# Patient Record
Sex: Male | Born: 1960 | Race: Black or African American | Hispanic: No | Marital: Single | Smoking: Former smoker
Health system: Southern US, Community
[De-identification: ages and names within clinical notes are randomized; demographics above are authoritative.]

## PROBLEM LIST (undated history)

## (undated) DIAGNOSIS — A539 Syphilis, unspecified: Secondary | ICD-10-CM

## (undated) DIAGNOSIS — Z972 Presence of dental prosthetic device (complete) (partial): Secondary | ICD-10-CM

## (undated) DIAGNOSIS — I10 Essential (primary) hypertension: Secondary | ICD-10-CM

## (undated) DIAGNOSIS — J45909 Unspecified asthma, uncomplicated: Secondary | ICD-10-CM

## (undated) DIAGNOSIS — B1920 Unspecified viral hepatitis C without hepatic coma: Secondary | ICD-10-CM

## (undated) DIAGNOSIS — G629 Polyneuropathy, unspecified: Secondary | ICD-10-CM

## (undated) HISTORY — PX: CARPAL TUNNEL RELEASE: SHX101

---

## 2012-10-30 ENCOUNTER — Encounter (HOSPITAL_COMMUNITY): Payer: Self-pay | Admitting: Emergency Medicine

## 2012-10-30 DIAGNOSIS — Y9389 Activity, other specified: Secondary | ICD-10-CM | POA: Insufficient documentation

## 2012-10-30 DIAGNOSIS — K219 Gastro-esophageal reflux disease without esophagitis: Secondary | ICD-10-CM | POA: Insufficient documentation

## 2012-10-30 DIAGNOSIS — R071 Chest pain on breathing: Secondary | ICD-10-CM | POA: Insufficient documentation

## 2012-10-30 DIAGNOSIS — W010XXA Fall on same level from slipping, tripping and stumbling without subsequent striking against object, initial encounter: Secondary | ICD-10-CM | POA: Insufficient documentation

## 2012-10-30 DIAGNOSIS — S4980XA Other specified injuries of shoulder and upper arm, unspecified arm, initial encounter: Secondary | ICD-10-CM | POA: Insufficient documentation

## 2012-10-30 DIAGNOSIS — Y929 Unspecified place or not applicable: Secondary | ICD-10-CM | POA: Insufficient documentation

## 2012-10-30 DIAGNOSIS — S79919A Unspecified injury of unspecified hip, initial encounter: Secondary | ICD-10-CM | POA: Insufficient documentation

## 2012-10-30 DIAGNOSIS — Z79899 Other long term (current) drug therapy: Secondary | ICD-10-CM | POA: Insufficient documentation

## 2012-10-30 DIAGNOSIS — S46909A Unspecified injury of unspecified muscle, fascia and tendon at shoulder and upper arm level, unspecified arm, initial encounter: Secondary | ICD-10-CM | POA: Insufficient documentation

## 2012-10-30 NOTE — ED Notes (Addendum)
PT. LOST BALANCED AND FELL YESTERDAY , REPORTS PAIN AT BILATERAL HIP JOINT AND LEFT SHOULDER PAIN , NO LOC /AMBULATORY.

## 2012-10-31 ENCOUNTER — Emergency Department (HOSPITAL_COMMUNITY): Payer: Self-pay

## 2012-10-31 ENCOUNTER — Other Ambulatory Visit: Payer: Self-pay

## 2012-10-31 ENCOUNTER — Emergency Department (HOSPITAL_COMMUNITY)
Admit: 2012-10-31 | Discharge: 2012-10-31 | Disposition: A | Discharge status: Home / Self Care | Payer: Self-pay | Attending: Emergency Medicine | Admitting: Emergency Medicine

## 2012-10-31 ENCOUNTER — Emergency Department (HOSPITAL_COMMUNITY)
Admission: EM | Admit: 2012-10-31 | Discharge: 2012-10-31 | Disposition: A | Discharge status: Home / Self Care | Payer: Self-pay | Attending: Emergency Medicine | Admitting: Emergency Medicine

## 2012-10-31 DIAGNOSIS — Y92009 Unspecified place in unspecified non-institutional (private) residence as the place of occurrence of the external cause: Secondary | ICD-10-CM

## 2012-10-31 DIAGNOSIS — M25512 Pain in left shoulder: Secondary | ICD-10-CM

## 2012-10-31 DIAGNOSIS — K219 Gastro-esophageal reflux disease without esophagitis: Secondary | ICD-10-CM

## 2012-10-31 DIAGNOSIS — W19XXXA Unspecified fall, initial encounter: Secondary | ICD-10-CM

## 2012-10-31 DIAGNOSIS — R079 Chest pain, unspecified: Secondary | ICD-10-CM

## 2012-10-31 DIAGNOSIS — M25552 Pain in left hip: Secondary | ICD-10-CM

## 2012-10-31 DIAGNOSIS — M25551 Pain in right hip: Secondary | ICD-10-CM

## 2012-10-31 LAB — POCT I-STAT TROPONIN I

## 2012-10-31 LAB — POCT I-STAT, CHEM 8
Chloride: 106 mEq/L (ref 96–112)
Creatinine, Ser: 0.9 mg/dL (ref 0.50–1.35)
Glucose, Bld: 90 mg/dL (ref 70–99)
Hemoglobin: 14.3 g/dL (ref 13.0–17.0)
Potassium: 3.9 mEq/L (ref 3.5–5.1)
Sodium: 142 mEq/L (ref 135–145)

## 2012-10-31 LAB — CBC WITH DIFFERENTIAL/PLATELET
Eosinophils Absolute: 0.1 10*3/uL (ref 0.0–0.7)
Hemoglobin: 13.7 g/dL (ref 13.0–17.0)
Lymphocytes Relative: 50 % — ABNORMAL HIGH (ref 12–46)
Lymphs Abs: 2.3 10*3/uL (ref 0.7–4.0)
Monocytes Relative: 9 % (ref 3–12)
Neutro Abs: 1.8 10*3/uL (ref 1.7–7.7)
Neutrophils Relative %: 39 % — ABNORMAL LOW (ref 43–77)
Platelets: 161 10*3/uL (ref 150–400)
RBC: 4.25 MIL/uL (ref 4.22–5.81)
WBC: 4.7 10*3/uL (ref 4.0–10.5)

## 2012-10-31 MED ORDER — RABEPRAZOLE SODIUM 20 MG PO TBEC: 20.0000 mg | DELAYED_RELEASE_TABLET | Freq: Every day | ORAL | Status: DC

## 2012-10-31 MED ORDER — PANTOPRAZOLE SODIUM 40 MG PO TBEC
40.0000 mg | DELAYED_RELEASE_TABLET | Freq: Once | ORAL | Status: AC
  Administered 2012-10-31: 40 mg via ORAL
  Filled 2012-10-31: qty 1

## 2012-10-31 MED ORDER — HYDROCODONE-ACETAMINOPHEN 5-325 MG PO TABS
2.0000 | ORAL_TABLET | Freq: Once | ORAL | Status: AC
  Administered 2012-10-31: 2 via ORAL
  Filled 2012-10-31: qty 2

## 2012-10-31 MED ORDER — HYDROCODONE-ACETAMINOPHEN 5-325 MG PO TABS: 1.0000 | ORAL_TABLET | Freq: Four times a day (QID) | ORAL | Status: DC | PRN

## 2012-10-31 MED ORDER — ASPIRIN 81 MG PO CHEW
324.0000 mg | CHEWABLE_TABLET | Freq: Once | ORAL | Status: AC
  Administered 2012-10-31: 324 mg via ORAL
  Filled 2012-10-31: qty 4

## 2012-10-31 NOTE — ED Provider Notes (Addendum)
History     CSN: [id number]  Arrival date & time 10/30/12  2319   First MD Initiated Contact with Patient 10/31/12 [phone]      Chief Complaint  Patient presents with  . Fall    HPI Shaun Morgan is a 52 y.o. male presenting with hip pain and knee pain and shoulder pain after a fall. Patient says he was stretching his hips because they've been painful over the course of the last few days despite trying to stretch them. He says he was stretching them when he went to his knees a sharp pain and fell over and fell onto his left shoulder as well. Patient's hip pain is moderate to severe, bilateral, worse on movement, worse on ambulation, not resolved with over-the-counter medications, no fevers or chills. Patient is also complaining about intermittent chest pains on review of systems. He says his chest pain lasts from 15-30 minutes daily, radiates to the left shoulder, associated with some shortness of breath, it is a sharp stabbing pain resting makes it better, walking makes it worse. Patient had a stress test in [place] 7-8 years ago which was normal. Patient does have a history of smoking.  Father died in late 11's from MI, sister died at age 77 from MI.   History reviewed. No pertinent past medical history.  History reviewed. No pertinent past surgical history.  No family history on file.  History  Substance Use Topics  . Smoking status: Never Smoker   . Smokeless tobacco: Not on file  . Alcohol Use: No      Review of Systems At least 10pt or greater review of systems completed and are negative except where specified in the HPI.  Allergies  Review of patient's allergies indicates no known allergies.  Home Medications   Current Outpatient Rx  Name  Route  Sig  Dispense  Refill  . PRESCRIPTION MEDICATION   Oral   Take 1 tablet by mouth daily. Blood pressure medication         . RABEprazole (ACIPHEX) 20 MG tablet   Oral   Take 1 tablet (20 mg total) by mouth daily.   30  tablet   0     BP 124/71  Pulse 66  Temp(Src) 97.5 F (36.4 C) (Oral)  Resp 14  SpO2 94%  Physical Exam  Nursing notes reviewed.  Electronic medical record reviewed. VITAL SIGNS:   Filed Vitals:   10/31/12 0430 10/31/12 0445 10/31/12 0500 10/31/12 0515  BP: 115/72 117/80 117/67 123/69  Pulse: 56 55 50 49  Temp:      TempSrc:      Resp: 16 13    SpO2: 95% 96% 97% 97%   CONSTITUTIONAL: Awake, oriented, appears non-toxic HENT: Atraumatic, normocephalic, oral mucosa pink and moist, airway patent. Nares patent without drainage. External ears normal. EYES: Conjunctiva clear, EOMI, PERRLA NECK: Trachea midline, non-tender, supple CARDIOVASCULAR: Normal heart rate, Normal rhythm, No murmurs, rubs, gallops PULMONARY/CHEST: Clear to auscultation, no rhonchi, wheezes, or rales. Symmetrical breath sounds. Non-tender. ABDOMINAL: Non-distended, soft, non-tender - no rebound or guarding.  BS normal. NEUROLOGIC: Non-focal, moving all four extremities, no gross sensory or motor deficits. EXTREMITIES: No clubbing, cyanosis, or edema. Mild tenderness to passive and active range of motion of the hips. No tenderness in the shoulders. SKIN: Warm, Dry, No erythema, No rash  ED Course  Procedures (including critical care time)  Date: 11/02/2012  Rate: 62  Rhythm: normal sinus rhythm  QRS Axis: normal  Intervals: normal  ST/T Wave abnormalities: normal  Conduction Disutrbances: none  Narrative Interpretation: Widespread ST elevation with J-point elevation suggests repol abnormality   Labs Reviewed - No data to display Dg Hip Complete Left  10/31/2012   *RADIOLOGY REPORT*  Clinical Data: History of fall complaining of left hip pain.  LEFT HIP - COMPLETE 2+ VIEW  Comparison: No priors.  Findings: Two views of the left hip demonstrate no acute displaced fracture, subluxation, dislocation, joint or soft tissue abnormality.  Mild degenerative changes of osteoarthritis are noted.  IMPRESSION: 1.   No acute radiographic abnormality of the left hip. 2.  Mild left hip osteoarthritis.   Original Report Authenticated By: Trudie Reed, M.D.   Dg Hip Complete Right  10/31/2012   *RADIOLOGY REPORT*  Clinical Data: History of fall complaining of right hip pain.  RIGHT HIP - COMPLETE 2+ VIEW  Comparison: No priors.  Findings: AP view of the pelvis and eight PA and lateral views of the right hip demonstrate no acute displaced fracture of the bony pelvic ring.  Right hip is located.  Bilateral proximal femurs as visualized appear intact. Mild joint space narrowing, subchondral sclerosis and osteophyte formation in the hip joints bilaterally.  IMPRESSION: 1.  No acute radiographic abnormality of the bony pelvis or the right hip. 2.  Mild degenerative changes of osteoarthritis of the hip joints bilaterally.   Original Report Authenticated By: Trudie Reed, M.D.   Dg Chest Port 1 View  10/31/2012   *RADIOLOGY REPORT*  Clinical Data: Chest pain.  PORTABLE CHEST - 1 VIEW  Comparison: No priors.  Findings: Lung volumes are normal.  No consolidative airspace disease.  No pleural effusions.  No pneumothorax.  No pulmonary nodule or mass noted.  Pulmonary vasculature and the cardiomediastinal silhouette are within normal limits.  IMPRESSION: 1. No radiographic evidence of acute cardiopulmonary disease.   Original Report Authenticated By: Trudie Reed, M.D.   Dg Shoulder Left  10/31/2012   *RADIOLOGY REPORT*  Clinical Data: Fall, shoulder pain  LEFT SHOULDER - 2+ VIEW  Comparison: None.  Findings: Left glenohumeral joint degenerative change identified. No fracture or dislocation.  Left lung apex is clear.  AC joint distance is normal.  IMPRESSION: No acute abnormality.  Left glenohumeral joint degenerative change.   Original Report Authenticated By: Christiana Pellant, M.D.     1. Hip pain, bilateral   2. Left shoulder pain   3. Fall at home, initial encounter   4. GERD (gastroesophageal reflux disease)   5.  Chest pain       MDM  Do not think the patient has any significant injury from his minor fall, no radiologic evidence of fracture, my physical exam does not support any severe fracture. 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 More concerning is the patient's history on review of systems of his intermittent chest pain with pertinent family history of 2 first degree relatives dying in their late 29s and mid 42s with MI. Patient has had chest pain intermittently for some time now this is concerning for unstable angina. EKG obtained, unremarkable this time. Troponin obtained and is also within normal limits the I had a discussion with the patient about getting admitted to the hospital for stress testing and rule out ACS. Patient says he has a custody hearing today in [place] and cannot stay in the hospital. Patient has competence to make this medical decision to leave the hospital AGAINST MEDICAL ADVICE. Patient says he will followup with [facility] and  get a stress test.  Pt understands he could have a massive heart attack or die from possible acute coronary syndrome he is willing to take this risk, he understands accepts and medical risks and will still leave.        Jones Skene, MD 11/01/12 [id number]  Jones Skene, MD 11/02/12 [id number]

## 2013-07-14 ENCOUNTER — Encounter (HOSPITAL_COMMUNITY): Payer: Self-pay | Admitting: Emergency Medicine

## 2013-07-14 ENCOUNTER — Emergency Department (HOSPITAL_COMMUNITY)
Admission: EM | Admit: 2013-07-14 | Discharge: 2013-07-14 | Disposition: A | Discharge status: Home / Self Care | Payer: PRIVATE HEALTH INSURANCE | Attending: Emergency Medicine | Admitting: Emergency Medicine

## 2013-07-14 DIAGNOSIS — R51 Headache: Secondary | ICD-10-CM | POA: Insufficient documentation

## 2013-07-14 DIAGNOSIS — I10 Essential (primary) hypertension: Secondary | ICD-10-CM | POA: Insufficient documentation

## 2013-07-14 DIAGNOSIS — Z79899 Other long term (current) drug therapy: Secondary | ICD-10-CM | POA: Insufficient documentation

## 2013-07-14 DIAGNOSIS — G5600 Carpal tunnel syndrome, unspecified upper limb: Secondary | ICD-10-CM | POA: Insufficient documentation

## 2013-07-14 DIAGNOSIS — J45909 Unspecified asthma, uncomplicated: Secondary | ICD-10-CM | POA: Insufficient documentation

## 2013-07-14 HISTORY — DX: Unspecified asthma, uncomplicated: J45.909

## 2013-07-14 HISTORY — DX: Essential (primary) hypertension: I10

## 2013-07-14 LAB — POCT I-STAT, CHEM 8
BUN: 14 mg/dL (ref 6–23)
CALCIUM ION: 1.21 mmol/L (ref 1.12–1.23)
CHLORIDE: 105 meq/L (ref 96–112)
Creatinine, Ser: 1.2 mg/dL (ref 0.50–1.35)
GLUCOSE: 103 mg/dL — AB (ref 70–99)
HEMATOCRIT: 45 % (ref 39.0–52.0)
Hemoglobin: 15.3 g/dL (ref 13.0–17.0)
Potassium: 4.2 mEq/L (ref 3.7–5.3)
Sodium: 145 mEq/L (ref 137–147)
TCO2: 28 mmol/L (ref 0–100)

## 2013-07-14 LAB — GLUCOSE, CAPILLARY: Glucose-Capillary: 91 mg/dL (ref 70–99)

## 2013-07-14 MED ORDER — HYDROCHLOROTHIAZIDE 25 MG PO TABS
12.5000 mg | ORAL_TABLET | Freq: Every day | ORAL | Status: DC
  Administered 2013-07-14: 12.5 mg via ORAL
  Filled 2013-07-14 (×2): qty 1

## 2013-07-14 MED ORDER — HYDROCODONE-ACETAMINOPHEN 5-325 MG PO TABS
1.0000 | ORAL_TABLET | Freq: Once | ORAL | Status: AC
  Administered 2013-07-14: 1 via ORAL
  Filled 2013-07-14: qty 1

## 2013-07-14 MED ORDER — AMLODIPINE BESYLATE 5 MG PO TABS
5.0000 mg | ORAL_TABLET | Freq: Every day | ORAL | Status: DC
  Administered 2013-07-14: 5 mg via ORAL
  Filled 2013-07-14: qty 1

## 2013-07-14 MED ORDER — HYDROCODONE-ACETAMINOPHEN 5-325 MG PO TABS: 1.0000 | ORAL_TABLET | Freq: Four times a day (QID) | ORAL | Status: DC | PRN

## 2013-07-14 NOTE — ED Provider Notes (Addendum)
CSN: [id number]     Arrival date & time 07/14/13  1602 History   First MD Initiated Contact with Patient 07/14/13 1624     Chief Complaint  Patient presents with  . Hypertension     (Consider location/radiation/quality/duration/timing/severity/associated sxs/prior Treatment) HPI Comments: Takes 2 blood pressure pills but has been away from home and has not taken them for the last 1 week.  Also states he had been eating salty food and has been very stressed due to recent school and testing.  Denies CP, SOB or abd pain.  Normal appetite.  Intermittent HA but not currently having a HA.  Patient is a 53 y.o. male presenting with hypertension. The history is provided by the patient.  Hypertension This is a new problem. Episode onset: 1 week. The problem occurs constantly. The problem has been gradually worsening. Associated symptoms include headaches. Pertinent negatives include no chest pain, no abdominal pain and no shortness of breath. Associated symptoms comments: occassional lightheadedness. Nothing aggravates the symptoms. The symptoms are relieved by rest and sleep. The treatment provided no relief.    Past Medical History  Diagnosis Date  . Hypertension   . Asthma    Past Surgical History  Procedure Laterality Date  . Carpal tunnel release     No family history on file. History  Substance Use Topics  . Smoking status: Never Smoker   . Smokeless tobacco: Not on file  . Alcohol Use: No    Review of Systems  Constitutional: Negative for fever.  Respiratory: Negative for shortness of breath.   Cardiovascular: Negative for chest pain.  Gastrointestinal: Negative for abdominal pain.  Musculoskeletal:       Pain in the right wrist and first 3 fingers of the hand where his carpal tunnel used to bother him.  Neurological: Positive for headaches.  All other systems reviewed and are negative.      Allergies  Review of patient's allergies indicates no known allergies.  Home  Medications   Current Outpatient Rx  Name  Route  Sig  Dispense  Refill  . amLODipine (NORVASC) 5 MG tablet   Oral   Take 5 mg by mouth daily.         . hydrochlorothiazide (HYDRODIURIL) 25 MG tablet   Oral   Take 12.5 mg by mouth daily.          BP 163/102  Pulse 79  Temp(Src) 97.3 F (36.3 C) (Oral)  Resp 18  Ht 5\' 10"  (1.778 m)  Wt 227 lb 8 oz (103.193 kg)  BMI 32.64 kg/m2  SpO2 98% Physical Exam  Nursing note and vitals reviewed. Constitutional: He is oriented to person, place, and time. He appears well-developed and well-nourished. No distress.  HENT:  Head: Normocephalic and atraumatic.  Right Ear: Tympanic membrane and ear canal normal.  Mouth/Throat: Oropharynx is clear and moist.  Left ear cerumen impaction  Eyes: Conjunctivae and EOM are normal. Pupils are equal, round, and reactive to light.  Neck: Normal range of motion. Neck supple.  Cardiovascular: Normal rate, regular rhythm and intact distal pulses.   No murmur heard. Pulmonary/Chest: Effort normal and breath sounds normal. No respiratory distress. He has no wheezes. He has no rales.  Abdominal: Soft. He exhibits no distension. There is no tenderness. There is no rebound and no guarding.  Musculoskeletal: Normal range of motion. He exhibits no edema and no tenderness.  Neurological: He is alert and oriented to person, place, and time. He has normal strength. No cranial  nerve deficit or sensory deficit.  No arm drift.  Normal speech.  Normal gait without ataxia  Skin: Skin is warm and dry. No rash noted. No erythema.  Psychiatric: He has a normal mood and affect. His behavior is normal.    ED Course  Procedures (including critical care time) Labs Review Labs Reviewed  POCT I-STAT, CHEM 8 - Abnormal; Notable for the following:    Glucose, Bld 103 (*)    All other components within normal limits   Imaging Review No results found.  EKG Interpretation    Date/Time:  Saturday July 14 2013  17:04:28 EST Ventricular Rate:  71 PR Interval:  152 QRS Duration: 71 QT Interval:  386 QTC Calculation: 419 R Axis:   55 Text Interpretation:  Sinus rhythm Left atrial enlargement Probable left ventricular hypertrophy Lateral infarct, acute (LAD) Baseline wander in lead(s) V2 V5 V6 Early repolarization No significant change since last tracing Confirmed by Maryan Rued  MD, Adilen Pavelko ([id number]) on 07/14/2013 5:25:14 PM            MDM   Final diagnoses:  Carpal tunnel syndrome  Hypertension   Patient is here with vague symptoms of intermittent lightheadedness with occasional headache for the last one week. He states that he is been very stressed because he's been taking final exams for school and then came down to [facility] and left his blood pressure medication at home for the last one week. He takes one tablet a day but was not sure what it was. She otherwise denies chest pain or shortness of breath. No focal weakness but states he had carpal tunnel release of the right braced and he is been having significant pain in the first 3 fingers of his hand. On exam he has no neurologic deficits, he's currently not complaining of a headache, sensation and movement strength is intact bilaterally with a normal gait, normal speech and no present arm drift.  Repeat blood pressure is 155/102. Will give patient a dose of his Norvasc and hydrochlorothiazide which was confirmed by pharmacy.  Low suspicion for intercranial pathology. Patient denies any chest pain or shortness of breath. Creatinine is 1.2 with no signs of renal failure. Patient also states he's been eating a lot of salty foods which is also not helping his blood pressure. No signs or symptoms concerning for stroke at this time.  8:19 PM Labs reassuring.  bP meds given and pt going home today.  Also given pain meds for carpal tunnel.  Blanchie Dessert, MD 07/14/13 2020  Blanchie Dessert, MD 07/14/13 2022

## 2013-07-14 NOTE — ED Notes (Signed)
Pt reports here due to ran out of BP meds x 1 week. Pt reports headache and dizziness onset around 1230. No facial droop, weak but equal grips B/L, no arm drift, speech clear.

## 2013-07-14 NOTE — Discharge Instructions (Signed)
Carpal Tunnel Syndrome The carpal tunnel is an area under the skin of the palm of your hand. Nerves, blood vessels, and strong tissues (tendons) pass through the tunnel. The tunnel can become puffy (swollen). If this happens, a nerve can be pinched in the wrist. This causes carpal tunnel syndrome.  HOME CARE  Take all medicine as told by your doctor.  If you were given a splint, wear it as told. Wear it at night or at times when your doctor told you to.  Rest your wrist from the activity that causes your pain.  Put ice on your wrist after long periods of wrist activity.  Put ice in a plastic bag.  Place a towel between your skin and the bag.  Leave the ice on for 15-20 minutes, 03-04 times a day.  Keep all doctor visits as told. GET HELP RIGHT AWAY IF:  You have new problems you cannot explain.  Your problems get worse and medicine does not help. MAKE SURE YOU:   Understand these instructions.  Will watch your condition.  Will get help right away if you are not doing well or get worse. Document Released: 05/06/2011 Document Revised: 08/09/2011 Document Reviewed: 05/06/2011 [facility] Patient Information 2014 [place], [place].  Arterial Hypertension Arterial hypertension (high blood pressure) is a condition of elevated pressure in your blood vessels. Hypertension over a long period of time is a risk factor for strokes, heart attacks, and heart failure. It is also the leading cause of kidney (renal) failure.  CAUSES   In Adults -- Over 90% of all hypertension has no known cause. This is called essential or primary hypertension. In the other 10% of people with hypertension, the increase in blood pressure is caused by another disorder. This is called secondary hypertension. Important causes of secondary hypertension are:  Heavy alcohol use.  Obstructive sleep apnea.  Hyperaldosterosim (Conn's syndrome).  Steroid use.  Chronic kidney  failure.  Hyperparathyroidism.  Medications.  Renal artery stenosis.  Pheochromocytoma.  Cushing's disease.  Coarctation of the aorta.  Scleroderma renal crisis.  Licorice (in excessive amounts).  Drugs (cocaine, methamphetamine). Your caregiver can explain any items above that apply to you.  In Children -- Secondary hypertension is more common and should always be considered.  Pregnancy -- Few women of childbearing age have high blood pressure. However, up to 10% of them develop hypertension of pregnancy. Generally, this will not harm the woman. It may be a sign of 3 complications of pregnancy: preeclampsia, HELLP syndrome, and eclampsia. Follow up and control with medication is necessary. SYMPTOMS   This condition normally does not produce any noticeable symptoms. It is usually found during a routine exam.  Malignant hypertension is a late problem of high blood pressure. It may have the following symptoms:  Headaches.  Blurred vision.  End-organ damage (this means your kidneys, heart, lungs, and other organs are being damaged).  Stressful situations can increase the blood pressure. If a person with normal blood pressure has their blood pressure go up while being seen by their caregiver, this is often termed "white coat hypertension." Its importance is not known. It may be related with eventually developing hypertension or complications of hypertension.  Hypertension is often confused with mental tension, stress, and anxiety. DIAGNOSIS  The diagnosis is made by 3 separate blood pressure measurements. They are taken at least 1 week apart from each other. If there is organ damage from hypertension, the diagnosis may be made without repeat measurements. Hypertension is usually identified by  having blood pressure readings:  Above 140/90 mmHg measured in both arms, at 3 separate times, over a couple weeks.  Over 130/80 mmHg should be considered a risk factor and may require  treatment in patients with diabetes. Blood pressure readings over 120/80 mmHg are called "pre-hypertension" even in non-diabetic patients. To get a true blood pressure measurement, use the following guidelines. Be aware of the factors that can alter blood pressure readings.  Take measurements at least 1 hour after caffeine.  Take measurements 30 minutes after smoking and without any stress. This is another reason to quit smoking  it raises your blood pressure.  Use a proper cuff size. Ask your caregiver if you are not sure about your cuff size.  Most home blood pressure cuffs are automatic. They will measure systolic and diastolic pressures. The systolic pressure is the pressure reading at the start of sounds. Diastolic pressure is the pressure at which the sounds disappear. If you are elderly, measure pressures in multiple postures. Try sitting, lying or standing.  Sit at rest for a minimum of 5 minutes before taking measurements.  You should not be on any medications like decongestants. These are found in many cold medications.  Record your blood pressure readings and review them with your caregiver. If you have hypertension:  Your caregiver may do tests to be sure you do not have secondary hypertension (see "causes" above).  Your caregiver may also look for signs of metabolic syndrome. This is also called Syndrome X or Insulin Resistance Syndrome. You may have this syndrome if you have type 2 diabetes, abdominal obesity, and abnormal blood lipids in addition to hypertension.  Your caregiver will take your medical and family history and perform a physical exam.  Diagnostic tests may include blood tests (for glucose, cholesterol, potassium, and kidney function), a urinalysis, or an EKG. Other tests may also be necessary depending on your condition. PREVENTION  There are important lifestyle issues that you can adopt to reduce your chance of developing hypertension:  Maintain a normal  weight.  Limit the amount of salt (sodium) in your diet.  Exercise often.  Limit alcohol intake.  Get enough potassium in your diet. Discuss specific advice with your caregiver.  Follow a DASH diet (dietary approaches to stop hypertension). This diet is rich in fruits, vegetables, and low-fat dairy products, and avoids certain fats. PROGNOSIS  Essential hypertension cannot be cured. Lifestyle changes and medical treatment can lower blood pressure and reduce complications. The prognosis of secondary hypertension depends on the underlying cause. Many people whose hypertension is controlled with medicine or lifestyle changes can live a normal, healthy life.  RISKS AND COMPLICATIONS  While high blood pressure alone is not an illness, it often requires treatment due to its short- and long-term effects on many organs. Hypertension increases your risk for:  CVAs or strokes (cerebrovascular accident).  Heart failure due to chronically high blood pressure (hypertensive cardiomyopathy).  Heart attack (myocardial infarction).  Damage to the retina (hypertensive retinopathy).  Kidney failure (hypertensive nephropathy). Your caregiver can explain list items above that apply to you. Treatment of hypertension can significantly reduce the risk of complications. TREATMENT   For overweight patients, weight loss and regular exercise are recommended. Physical fitness lowers blood pressure.  Mild hypertension is usually treated with diet and exercise. A diet rich in fruits and vegetables, fat-free dairy products, and foods low in fat and salt (sodium) can help lower blood pressure. Decreasing salt intake decreases blood pressure in a 1/3 of  people.  Stop smoking if you are a smoker. The steps above are highly effective in reducing blood pressure. While these actions are easy to suggest, they are difficult to achieve. Most patients with moderate or severe hypertension end up requiring medications to bring  their blood pressure down to a normal level. There are several classes of medications for treatment. Blood pressure pills (antihypertensives) will lower blood pressure by their different actions. Lowering the blood pressure by 10 mmHg may decrease the risk of complications by as much as 25%. The goal of treatment is effective blood pressure control. This will reduce your risk for complications. Your caregiver will help you determine the best treatment for you according to your lifestyle. What is excellent treatment for one person, may not be for you. HOME CARE INSTRUCTIONS   Do not smoke.  Follow the lifestyle changes outlined in the "Prevention" section.  If you are on medications, follow the directions carefully. Blood pressure medications must be taken as prescribed. Skipping doses reduces their benefit. It also puts you at risk for problems.  Follow up with your caregiver, as directed.  If you are asked to monitor your blood pressure at home, follow the guidelines in the "Diagnosis" section above. SEEK MEDICAL CARE IF:   You think you are having medication side effects.  You have recurrent headaches or lightheadedness.  You have swelling in your ankles.  You have trouble with your vision. SEEK IMMEDIATE MEDICAL CARE IF:   You have sudden onset of chest pain or pressure, difficulty breathing, or other symptoms of a heart attack.  You have a severe headache.  You have symptoms of a stroke (such as sudden weakness, difficulty speaking, difficulty walking). MAKE SURE YOU:   Understand these instructions.  Will watch your condition.  Will get help right away if you are not doing well or get worse. Document Released: 05/17/2005 Document Revised: 08/09/2011 Document Reviewed: 12/15/2006 [facility] Patient Information 2014 [place].

## 2013-10-15 ENCOUNTER — Emergency Department (HOSPITAL_COMMUNITY)
Admission: EM | Admit: 2013-10-15 | Discharge: 2013-10-16 | Disposition: A | Discharge status: Home / Self Care | Payer: PRIVATE HEALTH INSURANCE | Attending: Emergency Medicine | Admitting: Emergency Medicine

## 2013-10-15 ENCOUNTER — Encounter (HOSPITAL_COMMUNITY): Payer: Self-pay | Admitting: Emergency Medicine

## 2013-10-15 DIAGNOSIS — I10 Essential (primary) hypertension: Secondary | ICD-10-CM | POA: Insufficient documentation

## 2013-10-15 DIAGNOSIS — IMO0001 Reserved for inherently not codable concepts without codable children: Secondary | ICD-10-CM | POA: Insufficient documentation

## 2013-10-15 DIAGNOSIS — M62838 Other muscle spasm: Secondary | ICD-10-CM | POA: Insufficient documentation

## 2013-10-15 DIAGNOSIS — J45909 Unspecified asthma, uncomplicated: Secondary | ICD-10-CM | POA: Insufficient documentation

## 2013-10-15 DIAGNOSIS — Z87891 Personal history of nicotine dependence: Secondary | ICD-10-CM | POA: Insufficient documentation

## 2013-10-15 DIAGNOSIS — M549 Dorsalgia, unspecified: Secondary | ICD-10-CM | POA: Insufficient documentation

## 2013-10-15 DIAGNOSIS — M791 Myalgia, unspecified site: Secondary | ICD-10-CM

## 2013-10-15 DIAGNOSIS — Z79899 Other long term (current) drug therapy: Secondary | ICD-10-CM | POA: Insufficient documentation

## 2013-10-15 NOTE — ED Notes (Signed)
Pt reports that he stopped smoking and drinking alcohol on Mother's day. States that he started back on his BP medication. States that since then he has been having cramping on his left side including his arm and leg.

## 2013-10-15 NOTE — ED Notes (Signed)
Pt states that he was drinking 72 oz every other day prior to mothers day and then stopped on mothers day. Pt states that initially he started experiencing cramps and lightheadedness since mothers day. States that he was not taking his BP meds prior to Mothers day and started them again on that day.

## 2013-10-15 NOTE — ED Provider Notes (Signed)
CSN: [id number]     Arrival date & time 10/15/13  1847 History   First MD Initiated Contact with Patient 10/15/13 2331     Chief Complaint  Patient presents with  . Tingling     (Consider location/radiation/quality/duration/timing/severity/associated sxs/prior Treatment) HPI Comments: Patient presents with cramping of his left arm, left leg, and left back.  He reports that he has been having this cramping intermittently since restarting his blood pressure medication two weeks ago.  He is currently on Amlodipine and HCTZ.  He states that he has had similar cramping in the past when on these medications.  He denies numbness, tingling, weakness, dysphagia, vision changes, headache, chest pain, or SOB.  He reports that he does have a history of Alcohol Abuse.  Last drink was 8 days ago.  He also has a history of smoking.  Last cigarette was also 8 days ago.  The history is provided by the patient.    Past Medical History  Diagnosis Date  . Hypertension   . Asthma    Past Surgical History  Procedure Laterality Date  . Carpal tunnel release     History reviewed. No pertinent family history. History  Substance Use Topics  . Smoking status: Former Research scientist (life sciences)  . Smokeless tobacco: Not on file  . Alcohol Use: No    Review of Systems  Musculoskeletal: Positive for myalgias.  All other systems reviewed and are negative.     Allergies  Review of patient's allergies indicates no known allergies.  Home Medications   Prior to Admission medications   Medication Sig Start Date End Date Taking? Authorizing Provider  albuterol (PROVENTIL HFA;VENTOLIN HFA) 108 (90 BASE) MCG/ACT inhaler Inhale 2 puffs into the lungs every 6 (six) hours as needed for wheezing or shortness of breath.   Yes Historical Provider, MD  amLODipine (NORVASC) 5 MG tablet Take 5 mg by mouth daily.   Yes Historical Provider, MD  hydrochlorothiazide (HYDRODIURIL) 25 MG tablet Take 12.5 mg by mouth daily.   Yes Historical  Provider, MD   BP 132/95  Pulse 68  Temp(Src) 97.8 F (36.6 C) (Oral)  Resp 16  SpO2 100% Physical Exam  Nursing note and vitals reviewed. Constitutional: He appears well-developed and well-nourished.  HENT:  Head: Normocephalic and atraumatic.  Mouth/Throat: Oropharynx is clear and moist.  Eyes: EOM are normal. Pupils are equal, round, and reactive to light.  Cardiovascular: Normal rate, regular rhythm and normal heart sounds.   Pulmonary/Chest: Effort normal and breath sounds normal.  Musculoskeletal: Normal range of motion.  Neurological: He is alert. He has normal strength. No cranial nerve deficit or sensory deficit. Coordination and gait normal.  Sensation of both hands and both feet intact  Skin: Skin is warm and dry.  Psychiatric: He has a normal mood and affect.    ED Course  Procedures (including critical care time) Labs Review Labs Reviewed - No data to display  Imaging Review No results found.   EKG Interpretation None      MDM   Final diagnoses:  None  Patient complaining of muscle spasms and muscle cramps in his back, arm, and leg.  He reports that symptoms began when he started back on his blood pressure medication.  He has had similar symptoms in the past when on blood pressure medication.  Normal neurological exam.  Labs unremarkable.  Suspect that symptoms may be a side effect of medication.  Patient stable for discharge.  Return precautions given.   Hyman Bible, PA-C 10/17/13  2348 

## 2013-10-16 LAB — CBC WITH DIFFERENTIAL/PLATELET
Basophils Absolute: 0 10*3/uL (ref 0.0–0.1)
Basophils Relative: 0 % (ref 0–1)
EOS ABS: 0.1 10*3/uL (ref 0.0–0.7)
Eosinophils Relative: 2 % (ref 0–5)
HCT: 43.4 % (ref 39.0–52.0)
HEMOGLOBIN: 14.5 g/dL (ref 13.0–17.0)
LYMPHS PCT: 45 % (ref 12–46)
Lymphs Abs: 2.8 10*3/uL (ref 0.7–4.0)
MCH: 33 pg (ref 26.0–34.0)
MCHC: 33.4 g/dL (ref 30.0–36.0)
MCV: 98.9 fL (ref 78.0–100.0)
Monocytes Absolute: 0.5 10*3/uL (ref 0.1–1.0)
Monocytes Relative: 7 % (ref 3–12)
NEUTROS PCT: 46 % (ref 43–77)
Neutro Abs: 2.9 10*3/uL (ref 1.7–7.7)
Platelets: 185 10*3/uL (ref 150–400)
RBC: 4.39 MIL/uL (ref 4.22–5.81)
RDW: 12.8 % (ref 11.5–15.5)
WBC: 6.4 10*3/uL (ref 4.0–10.5)

## 2013-10-16 LAB — BASIC METABOLIC PANEL
BUN: 14 mg/dL (ref 6–23)
CO2: 25 meq/L (ref 19–32)
Calcium: 9 mg/dL (ref 8.4–10.5)
Chloride: 102 mEq/L (ref 96–112)
Creatinine, Ser: 1.13 mg/dL (ref 0.50–1.35)
GFR calc Af Amer: 85 mL/min — ABNORMAL LOW (ref >90)
GFR, EST NON AFRICAN AMERICAN: 73 mL/min — AB (ref >90)
Glucose, Bld: 106 mg/dL — ABNORMAL HIGH (ref 70–99)
POTASSIUM: 3.7 meq/L (ref 3.7–5.3)
SODIUM: 140 meq/L (ref 137–147)

## 2013-10-16 NOTE — Discharge Instructions (Signed)
Your muscle spasms may be a side effect of your blood pressure medication.  It is important for you to take your blood pressure medication as prescribed.  Follow up with a Primary Care Physician.  Return to the Emergency Department if you develop weakness of your arms or legs, chest pain, difficulty speaking, difficulty swallowing, vision changes or anything else that concerns you.    Emergency Department Resource Guide 1) Find a Doctor and Pay Out of Pocket Although you won't have to find out who is covered by your insurance plan, it is a good idea to ask around and get recommendations. You will then need to call the office and see if the doctor you have chosen will accept you as a new patient and what types of options they offer for patients who are self-pay. Some doctors offer discounts or will set up payment plans for their patients who do not have insurance, but you will need to ask so you aren't surprised when you get to your appointment.  2) Contact Your Local Health Department Not all health departments have doctors that can see patients for sick visits, but many do, so it is worth a call to see if yours does. If you don't know where your local health department is, you can check in your phone book. The CDC also has a tool to help you locate your state's health department, and many state websites also have listings of all of their local health departments.  3) Find a [facility] If your illness is not likely to be very severe or complicated, you may want to try a walk in clinic. These are popping up all over the country in pharmacies, drugstores, and shopping centers. They're usually staffed by nurse practitioners or physician assistants that have been trained to treat common illnesses and complaints. They're usually fairly quick and inexpensive. However, if you have serious medical issues or chronic medical problems, these are probably not your best option.  No Primary Care Doctor: - Call  Health Connect at  [phone] - they can help you locate a primary care doctor that  accepts your insurance, provides certain services, etc. - Physician Referral Service- [phone]  Chronic Pain Problems: Organization         Address  Phone   Notes  [facility]  [phone] Patients need to be referred by their primary care doctor.   Medication Assistance: Organization         Address  Phone   Notes  [facility] Medication [facility] [address] [place], [place] [address] [phone] --Must be a resident of [facility] -- Must have NO insurance coverage whatsoever (no Medicaid/ Medicare, etc.) -- The pt. MUST have a primary care doctor that directs their care regularly and follows them in the community   [facility]ist  [phone]   [employer]  [phone]    Agencies that provide inexpensive medical care: Organization         Address  Phone   Notes  [place]  [phone]   [facility]    [phone]   [facility] [place], [place] [address] [phone]   [place] [address] [place] [phone]   Planned Parenthood    [phone]   [facility]    [phone]   [place] and [place]  Mardene Speak Phone:  [phone], Fax:  [phone] Hours of Operation:  9 am - 6 pm, M-F.  Also accepts Medicaid/Medicare and self-pay.  [facility] for [place], [address], [place] Phone: [phone], Fax: [phone]. Hours of Operation:  8:30 am - 5:30 pm, M-F.  Also accepts Medicaid and self-pay.  [facility] [place] [address], [place] Phone: [phone]   [place], [place], [place] [phone], Ext. [phone] Mondays & Thursdays: 7-9 AM.  First 15 patients are seen on a first come, first serve  basis.    [place] Providers:  Organization         Address  Phone   Notes  [facility] [address], [place] [phone] Also accepts self-pay patients.  [facility] [place] [address], [place]  [phone]   [place], [address], [place] [phone]   [facility] [address] [place] [phone]   Lucianne Lei [address], [place]   [phone] Only accepts [place] Access [place] patients after they have their name applied to their card.   Self-Pay (no insurance) in [facility]:  Organization         Address  Phone   Notes  Sickle Cell Patients, [facility] [place] [phone]   [facility] [place] [phone]   [facility] [place]  [place], [place], [place] [phone]   [facility]/Dr. Osei-Bonsu  [address] [place] or [address], [place] [phone] Phone number for both [place] and [place] locations is the same.  [facility] [address] [place] [phone]   [facility] [address], [place] or [address] [phone]   [facility] [address], [place] [phone], phone; [phone], fax Sees patients 1st and 3rd Saturday of every month.  Must not qualify for public or private insurance (i.e. Medicaid, Medicare, [place] Health Choice, Veterans' Benefits)  Household income should be no more than 200% of the poverty level The clinic cannot treat you if you are pregnant or think you are pregnant  Sexually transmitted diseases are not treated at the clinic.    Dental Care: Organization         Address  Phone  Notes  [facility] Department of [facility] [place] [phone] Accepts children up to age 83 who are enrolled in [place] or [place]; pregnant women with a Medicaid card; and children who have applied for Medicaid or [place] Health Choice, but were declined, whose parents can pay a reduced fee at time of service.  [facility] of [facility]  [address], [place] [phone] Accepts children up to age 81 who are enrolled in [place] or [place]; pregnant women with a Medicaid card; and children who have applied for Medicaid or [place] Health Choice, but were declined, whose parents can pay a reduced fee at time of service.  [place] Adult [facility] Access PRO[facility]  [place] [phone] Patients are seen  by appointment only. Walk-ins are not accepted. [place] will see patients 43 years of age and older. Monday - Tuesday (8am-5pm) Most Wednesdays (8:30-5pm) $30 per visit, cash only  [facility] Adult [facility] Access PRO[facility]  [address], [facility] [phone] Patients are seen by appointment only. Walk-ins are not accepted. [place] will see patients 22 years of age and older. One Wednesday Evening (Monthly: Volunteer Based).  $30 per visit, cash only  [place]  [phone] for adults; Children under age 43, call Graduate Pediatric Dentistry at [phone]. Children aged 30-14, please call [phone] to request a pediatric application.  Dental services are provided in all areas of dental care including fillings, crowns and bridges, complete and partial dentures, implants, gum treatment, root canals, and extractions. Preventive care is also provided. Treatment is provided to both adults and children. Patients are selected via a lottery and there is often a waiting list.   [facility] [address], [place]  [phone]  [email or web address]   [facility] [address] [place], [place] [phone], Ext. [phone] Second and Fourth Thursday of each month, opens at 6:30 AM; Clinic ends at 9 AM.  Patients are seen on a first-come first-served basis, and a limited number are seen during each clinic.   [facility]  [address] Hillard Danker [place], [place] [phone]   Eligibility Requirements You must have lived in [place], [place], or [place] counties for at least the last three months.   You cannot be eligible for state or federal sponsored [employer], including [employer], [place], or [employer].   You generally cannot be eligible for healthcare insurance through your employer.    How to apply: Eligibility screenings are held every Tuesday and Wednesday afternoon from 1:00 pm until 4:00 pm. You do not need an appointment for the interview!  [facility] [address], [place], [place]   [place]  [place] Department  [place]  [phone]    Behavioral Health Resources in the Community: Intensive Outpatient Programs Organization         Address  Phone  Notes  [place] [place] [address], [place], [place] [phone]   [facility] Outpatient [address] [place], [place]   [facility] [address] [place], [place]   [place] [address]  [place], [place] or [phone]   Substance Abuse Resources Organization         Address  Phone  Notes  [facility] Drug [facility]  [phone]   [place]  [phone]   [facility] [place]   Chinita Pester  [phone]   Residential & Outpatient Substance Abuse Program  [phone]   Psychological Services Organization          Address  Phone  Notes  [facility]  [place]  [phone]   [place] [address] [place] or [phone]    Mobile Crisis Teams Organization         Address  Phone  Notes  [facility], Mobile Crisis Care Unit  [phone]   [facility]  [address] [place], [place]   [facility] [address] [place]  [phone]   ° °Self-Help/Support Groups °Organization         Address  Phone             Notes  °[facility] [place] - variety of support groups  336- [phone] Call for more information  °Narcotics Anonymous (NA), [facility] [address], °[place]  2 meetings at this location  ° °Residential Treatment Programs °Organization         Address  Phone  Notes  °[facility] [address],    °[place]  [phone]   °[facility] ° [address], [place], [place] [phone]   °[facility] [address], [place] [phone] Admissions: 8am-3pm M-F  °[facility] [address]    °[place], [place] [phone]   °[facility] [address], [place], [place] [phone]   °[facility] [address]  °[place], [place] [phone]   °[facility] - Intensive Outpatient [address], [place], [place] [phone]   °[facility] [address]  °[place], [place] 1-877-615-2722 or [phone]   °[facility] ([facility]) [address] [place], [place] [phone] Accepts Medicaid  °[facility] [address]  °[place] 1-800-659-3381 Substance Abuse/Addiction Treatment  ° °[place] Behavioral Health Resources °Organization         Address  Phone  Notes  °[facility]  (888) [phone]   °Julie Brannon, PhD [address] [place], [place]   ([phone]) [phone] or (336) [phone]   °[facility]   [address] °[place], [place] (336) [phone]   °[facility] [address], [place], [place] ([phone]) [phone] Insurance/Medicaid/sponsorship through [facility]  °[facility] [address]                                    [place], [place] [phone]) [phone] Therapy/tele-psych/case  °[facility] [address]  ° [place],  ([phone]) [phone]    °Dr. Arfeen  (336) [phone]   °[facility] [place]  [employer] [place] [facility] 1) [address], [place] °2) [address], [place] °3)  [address], [place] (336) [phone] °(336) [phone] ° °(336) [phone]   °[place] Child Abuse Hotline (336) 342-1394 or (336) 342-3537 (After Hours)    ° ° ° °

## 2013-10-18 NOTE — ED Provider Notes (Signed)
Medical screening examination/treatment/procedure(s) were performed by non-physician practitioner and as supervising physician I was immediately available for consultation/collaboration.   Delora Fuel, MD 43/15/40 0867

## 2014-04-23 DIAGNOSIS — Z87891 Personal history of nicotine dependence: Secondary | ICD-10-CM | POA: Diagnosis not present

## 2014-04-23 DIAGNOSIS — J45909 Unspecified asthma, uncomplicated: Secondary | ICD-10-CM | POA: Insufficient documentation

## 2014-04-23 DIAGNOSIS — Z7952 Long term (current) use of systemic steroids: Secondary | ICD-10-CM | POA: Insufficient documentation

## 2014-04-23 DIAGNOSIS — R51 Headache: Secondary | ICD-10-CM | POA: Diagnosis not present

## 2014-04-23 DIAGNOSIS — J329 Chronic sinusitis, unspecified: Secondary | ICD-10-CM | POA: Insufficient documentation

## 2014-04-23 DIAGNOSIS — Z79899 Other long term (current) drug therapy: Secondary | ICD-10-CM | POA: Insufficient documentation

## 2014-04-23 DIAGNOSIS — R0981 Nasal congestion: Secondary | ICD-10-CM | POA: Diagnosis present

## 2014-04-23 DIAGNOSIS — I10 Essential (primary) hypertension: Secondary | ICD-10-CM | POA: Diagnosis not present

## 2014-04-24 ENCOUNTER — Emergency Department (HOSPITAL_COMMUNITY)
Admission: EM | Admit: 2014-04-24 | Discharge: 2014-04-24 | Disposition: A | Discharge status: Home / Self Care | Payer: PRIVATE HEALTH INSURANCE | Attending: Emergency Medicine | Admitting: Emergency Medicine

## 2014-04-24 ENCOUNTER — Encounter (HOSPITAL_COMMUNITY): Payer: Self-pay | Admitting: Emergency Medicine

## 2014-04-24 DIAGNOSIS — J329 Chronic sinusitis, unspecified: Secondary | ICD-10-CM

## 2014-04-24 DIAGNOSIS — R519 Headache, unspecified: Secondary | ICD-10-CM

## 2014-04-24 DIAGNOSIS — R51 Headache: Secondary | ICD-10-CM

## 2014-04-24 DIAGNOSIS — B9789 Other viral agents as the cause of diseases classified elsewhere: Secondary | ICD-10-CM

## 2014-04-24 MED ORDER — FLUTICASONE PROPIONATE 50 MCG/ACT NA SUSP: 2.0000 | Freq: Every day | NASAL | Status: DC

## 2014-04-24 MED ORDER — IBUPROFEN 800 MG PO TABS: 800.0000 mg | ORAL_TABLET | Freq: Three times a day (TID) | ORAL | Status: DC

## 2014-04-24 MED ORDER — IBUPROFEN 400 MG PO TABS
800.0000 mg | ORAL_TABLET | Freq: Once | ORAL | Status: AC
  Administered 2014-04-24: 800 mg via ORAL
  Filled 2014-04-24: qty 2

## 2014-04-24 NOTE — Discharge Instructions (Signed)
1. Medications: flonase, ibuprofen, usual home medications 2. Treatment: rest, drink plenty of fluids, OTC cold medication as needed, ibuprofen for headache 3. Follow Up: Please followup with your primary doctor in 3-5 days for discussion of your diagnoses and further evaluation after today's visit; if you do not have a primary care doctor use the resource guide provided to find one; Please return to the ER for high fevers, intractable vomiting or other concerning symptoms     Emergency Department Resource Guide 1) Find a Doctor and Pay Out of Pocket Although you won't have to find out who is covered by your insurance plan, it is a good idea to ask around and get recommendations. You will then need to call the office and see if the doctor you have chosen will accept you as a new patient and what types of options they offer for patients who are self-pay. Some doctors offer discounts or will set up payment plans for their patients who do not have insurance, but you will need to ask so you aren't surprised when you get to your appointment.  2) Contact Your Local Health Department Not all health departments have doctors that can see patients for sick visits, but many do, so it is worth a call to see if yours does. If you don't know where your local health department is, you can check in your phone book. The CDC also has a tool to help you locate your state's health department, and many state websites also have listings of all of their local health departments.  3) Find a [facility] If your illness is not likely to be very severe or complicated, you may want to try a walk in clinic. These are popping up all over the country in pharmacies, drugstores, and shopping centers. They're usually staffed by nurse practitioners or physician assistants that have been trained to treat common illnesses and complaints. They're usually fairly quick and inexpensive. However, if you have serious medical issues or  chronic medical problems, these are probably not your best option.  No Primary Care Doctor: - Call Health Connect at  [phone] - they can help you locate a primary care doctor that  accepts your insurance, provides certain services, etc. - Physician Referral Service- [phone]  Chronic Pain Problems: Organization         Address  Phone   Notes  [facility]  [phone] Patients need to be referred by their primary care doctor.   Medication Assistance: Organization         Address  Phone   Notes  [facility] Medication [facility] [address] [place], [place] [address] [phone] --Must be a resident of [facility] -- Must have NO insurance coverage whatsoever (no Medicaid/ Medicare, etc.) -- The pt. MUST have a primary care doctor that directs their care regularly and follows them in the community   [facility]ist  [phone]   [employer]  [phone]    Agencies that provide inexpensive medical care: Organization         Address  Phone   Notes  [place]  [phone]   [facility]    [phone]   [facility] [place],  [phone]   [place] [address] [place] [phone]   Planned Parenthood    [phone]   [facility]    [phone]   [place] [address], [place] Phone:  [phone], Fax:  [phone] Hours of Operation:  9 am - 6 pm, M-F.  Also accepts Medicaid/Medicare and self-pay.  [facility] [place], [address], [place] Phone: [phone], Fax: [phone]. Hours of Operation:  8:30 am - 5:30 pm, M-F.  Also accepts Medicaid and self-pay.  [facility] [address] [place] Phone: [phone]   [place], [place], [place]  [phone], Ext. [phone] Mondays & Thursdays: 7-9 AM.  First 15 patients are seen on a first come, first serve basis.    [place] Providers:  Organization         Address  Phone   Notes  [facility] [address], [place] [phone] Also accepts self-pay patients.  [facility] [address], [place]  [phone]   [place], [address], [place] [phone]   [facility] [address] [place] [phone]   Lucianne Lei [address], [place]   [phone] Only accepts [place] Access [place] patients after they have their name applied to their card.   Self-Pay (no insurance) in [facility]:  Organization         Address  Phone   Notes  Sickle Cell Patients, [facility] [place] [phone]   [facility] [place] [phone]   [facility] [place]  [place], [place], [place] [phone]   [facility]/Dr. Osei-Bonsu  [address], [place] or [address], [place] [phone] Phone number for both [place] and [place] locations is the same.  [facility] [address] [place] [phone]   [facility] [address] [place] or [address] [phone]   [facility] [address] [place] [phone], phone; [phone], fax Sees patients 1st and 3rd Saturday of every month.  Must not qualify for public or private insurance (i.e. Medicaid, Medicare, [place] Health Choice, Veterans' Benefits)  Household income should be no more than 200% of the poverty level The clinic cannot treat you if you are pregnant or think you are pregnant  Sexually transmitted  diseases are not treated at the clinic.    Dental Care: Organization         Address  Phone  Notes  [facility] Department of [facility] [phone] Accepts children up to age 10 who are enrolled in [place] or [place]; pregnant women with a Medicaid card; and children who have applied for Medicaid or [place] Health Choice, but were declined, whose parents can pay a reduced fee at time of service.  [facility] Department of [facility]  [address], [place] [phone] Accepts children up to age 22 who are enrolled in [place] or [place]; pregnant women with a Medicaid card; and children who have applied for Medicaid or [place] Health Choice, but were declined, whose parents can pay a reduced fee at time of service.  [place]  Access PROGRAM  H[address] Rivera [place] [phone] Patients are seen by appointment only. Walk-ins are not accepted. [place] will see patients 57 years of age and older. Monday - Tuesday (8am-5pm) Most Wednesdays (8:30-5pm) $30 per visit, cash only  [facility] Adult Dental Access PROGRAM  [address], [facility] [phone] Patients are seen by appointment only. Walk-ins are not accepted. [place] will see patients 61 years of age and older. One Wednesday Evening (Monthly: Volunteer Based).  $30 per visit, cash only  [place]  [phone] for adults; Children under age 78, call Graduate Pediatric Dentistry at [phone]. Children aged 60-14, please call [phone] to request a pediatric application.  Dental services are provided in all areas of dental care including fillings, crowns and bridges, complete and partial dentures, implants, gum treatment, root canals, and extractions. Preventive care is also provided. Treatment is provided to both adults and children. Patients are selected via a  lottery and there is often a waiting list.   [facility] [address] [place]  [phone] [email or web address]   [facility] [address] [place], [place] [phone], Ext. [phone] Second and Fourth Thursday of each month, opens at 6:30 AM; Clinic ends at 9 AM.  Patients are seen on a first-come first-served basis, and a limited number are seen during each clinic.   [facility]  [address] Hillard Danker [place], [place] [phone]   Eligibility Requirements You must have lived in [place], [place], or [place] counties for at least the last three months.   You cannot be eligible for state or federal sponsored [employer], including [employer], [place], or [employer].   You generally cannot be eligible for healthcare insurance through your employer.    How to apply: Eligibility screenings are held every Tuesday and Wednesday afternoon from 1:00 pm until 4:00 pm. You do not need an appointment for the interview!  [facility] [address], [place], [place]   [place]  [place] Department  [place]  [phone]    Behavioral Health Resources in the Community: Intensive Outpatient Programs Organization         Address  Phone  Notes  [place], [place], [place] [phone]   [facility] Outpatient [address], [place], [place]   ADS: Alcohol & Drug Svcs [address], [place], [place]   [place] [address]  [place], [place] or [phone]   Substance Abuse Resources Organization         Address  Phone  Notes  Alcohol and Drug Services  [phone]   [place]  [phone]   [facility] [place]   Chinita Pester  [phone]   Residential &  Outpatient Substance Abuse Program  [phone]   Psychological Services Organization         Address  Phone  Notes  [facility] [place]  [place]  [phone]   [place] [address] [place] or [phone]    Mobile Crisis Teams Organization         Address  Phone  Notes  [facility], Mobile Crisis Care Unit  [phone]   [facility]  [address] [place],  [place] [address] [place] [phone]    Self-Help/Support Groups Organization         Address  Phone             Notes  [facility] of [place] - variety of support groups  [place] Call for more information  Narcotics Anonymous (NA), [facility]aring [facility] [address], [employer] [place]  2 meetings at this location   Special educational needs teacher         Address  Phone  Notes  [facility] Residential Treatment [place],    [place]  [phone]   [facility]  [address] [place] [phone], [place], [place]   [place], [place] [phone] Admissions: 8am-3pm M-F  Incentives Substance [place] [address],    [place], [place] [phone]   [facility] [address] [place], [place], [place]   [facility] [address],  [place], [place]   [facility] Programs - Intensive Outpatient [address]een [address], [place], [place]   [facility] ([place].,  [place], [place] [phone] or [phone]   [facility] Services (RTS) [address], [place], [place] Accepts Medicaid  [facility] [place] [address],  [place] [phone] Substance Abuse/Addiction Treatment   [facility] Organization          Address  Phone  Notes  [facility]  [phone]   Domenic Schwab, PhD [address] Arlis Porta [place], [place]   [phone] or [phone]   [place], [place] [phone]   [facility] [address], [place], [place] [phone] Insurance/Medicaid/sponsorship through [facility] and Families [address]                                    [place], [place] [phone] [place] [address], [place] [phone]    Dr. Adele Schilder  [phone]   [facility] Dept. 1) [address], [place] 2) [place] 3)  [place] [address], [place] [phone]  [phone]   [place] [phone] or [phone] (After Hours)

## 2014-04-24 NOTE — ED Provider Notes (Signed)
CSN: [id number]     Arrival date & time 04/23/14  2348 History   First MD Initiated Contact with Patient 04/24/14 0022     Chief Complaint  Patient presents with  . Nasal Congestion     (Consider location/radiation/quality/duration/timing/severity/associated sxs/prior Treatment) The history is provided by the patient and medical records.   Shaun Morgan is a 53 y.o. male  with a hx of hypertension, asthma presents to the Emergency Department complaining of gradual, persistent, progressively worsening sinus pressure with associated headache onset yesterday evening. Patient reports clear sinus drainage, itchy nose, sneezing and sinus congestion. He denies sick contacts. Denies diabetes. No medications prior to arrival. No aggravating or alleviating factors. Patient denies fever, chills, neck pain, neck stiffness, nausea, vomiting, numbness, weakness, syncope.  Past Medical History  Diagnosis Date  . Hypertension   . Asthma    Past Surgical History  Procedure Laterality Date  . Carpal tunnel release     No family history on file. History  Substance Use Topics  . Smoking status: Former Research scientist (life sciences)  . Smokeless tobacco: Not on file  . Alcohol Use: No    Review of Systems  Constitutional: Negative for fever, chills, appetite change and fatigue.  HENT: Positive for congestion, sinus pressure and sneezing. Negative for ear discharge, ear pain, mouth sores, postnasal drip, rhinorrhea and sore throat.   Eyes: Negative for visual disturbance.  Respiratory: Negative for cough, chest tightness, shortness of breath, wheezing and stridor.   Cardiovascular: Negative for chest pain, palpitations and leg swelling.  Gastrointestinal: Negative for nausea, vomiting, abdominal pain and diarrhea.  Genitourinary: Negative for dysuria, urgency, frequency and hematuria.  Musculoskeletal: Negative for myalgias, back pain, arthralgias and neck stiffness.  Skin: Negative for rash.  Neurological: Positive  for headaches. Negative for syncope, light-headedness and numbness.  Hematological: Negative for adenopathy.  Psychiatric/Behavioral: The patient is not nervous/anxious.   All other systems reviewed and are negative.     Allergies  Review of patient's allergies indicates no known allergies.  Home Medications   Prior to Admission medications   Medication Sig Start Date End Date Taking? Authorizing Provider  albuterol (PROVENTIL HFA;VENTOLIN HFA) 108 (90 BASE) MCG/ACT inhaler Inhale 2 puffs into the lungs every 6 (six) hours as needed for wheezing or shortness of breath.    Historical Provider, MD  amLODipine (NORVASC) 5 MG tablet Take 5 mg by mouth daily.    Historical Provider, MD  fluticasone (FLONASE) 50 MCG/ACT nasal spray Place 2 sprays into both nostrils daily. 04/24/14   Xian Alves, PA-C  hydrochlorothiazide (HYDRODIURIL) 25 MG tablet Take 12.5 mg by mouth daily.    Historical Provider, MD  ibuprofen (ADVIL,MOTRIN) 800 MG tablet Take 1 tablet (800 mg total) by mouth 3 (three) times daily. 04/24/14   Vi Whitesel, PA-C   BP 141/87 mmHg  Pulse 87  Temp(Src) 98.2 F (36.8 C) (Oral)  Resp 20  Ht 5\' 10"  (1.778 m)  Wt 222 lb (100.699 kg)  BMI 31.85 kg/m2  SpO2 97% Physical Exam  Constitutional: He is oriented to person, place, and time. He appears well-developed and well-nourished. No distress.  HENT:  Head: Normocephalic and atraumatic.  Right Ear: Tympanic membrane, external ear and ear canal normal.  Left Ear: Tympanic membrane, external ear and ear canal normal.  Nose: Mucosal edema and rhinorrhea present. No epistaxis. Right sinus exhibits maxillary sinus tenderness. Right sinus exhibits no frontal sinus tenderness. Left sinus exhibits maxillary sinus tenderness. Left sinus exhibits no frontal sinus tenderness.  Mouth/Throat:  Uvula is midline, oropharynx is clear and moist and mucous membranes are normal. Mucous membranes are not pale and not cyanotic. No  oropharyngeal exudate, posterior oropharyngeal edema, posterior oropharyngeal erythema or tonsillar abscesses.  Mild maxillary sinus tenderness  Eyes: Conjunctivae and EOM are normal. Pupils are equal, round, and reactive to light. No scleral icterus.  No horizontal, vertical or rotational nystagmus  Neck: Normal range of motion and full passive range of motion without pain. Neck supple.  Full active and passive ROM without pain No midline or paraspinal tenderness No nuchal rigidity or meningeal signs  Cardiovascular: Normal rate, regular rhythm and intact distal pulses.   Pulmonary/Chest: Effort normal and breath sounds normal. No stridor. No respiratory distress. He has no wheezes. He has no rales.  Clear and equal breath sounds without focal wheezes, rhonchi, rales  Abdominal: Soft. Bowel sounds are normal. There is no tenderness. There is no rebound and no guarding.  Musculoskeletal: Normal range of motion.  Lymphadenopathy:    He has no cervical adenopathy.  Neurological: He is alert and oriented to person, place, and time. He has normal reflexes. No cranial nerve deficit. He exhibits normal muscle tone. Coordination normal.  Mental Status:  Alert, oriented, thought content appropriate. Speech fluent without evidence of aphasia. Able to follow 2 step commands without difficulty.  Cranial Nerves:  II:  Peripheral visual fields grossly normal, pupils equal, round, reactive to light III,IV, VI: ptosis not present, extra-ocular motions intact bilaterally  V,VII: smile symmetric, facial light touch sensation equal VIII: hearing grossly normal bilaterally  IX,X: gag reflex present  XI: bilateral shoulder shrug equal and strong XII: midline tongue extension  Motor:  5/5 in upper and lower extremities bilaterally including strong and equal grip strength and dorsiflexion/plantar flexion Sensory: Pinprick and light touch normal in all extremities.  Deep Tendon Reflexes: 2+ and symmetric   Cerebellar: normal finger-to-nose with bilateral upper extremities Gait: normal gait and balance CV: distal pulses palpable throughout   Skin: Skin is warm and dry. No rash noted. He is not diaphoretic.  Psychiatric: He has a normal mood and affect. His behavior is normal. Judgment and thought content normal.  Nursing note and vitals reviewed.   ED Course  Procedures (including critical care time) Labs Review Labs Reviewed - No data to display  Imaging Review No results found.   EKG Interpretation None      MDM   Final diagnoses:  Sinus headache  Viral sinusitis    Shaun Morgan presents with sinus congestion and sinus headache.  Patient complaining of symptoms of sinusitis.  Mild to moderate symptoms of clear/yellow nasal discharge/congestion and scratchy throat with cough for less than 10 days.  Patient is afebrile.  No concern for acute bacterial rhinosinusitis; likely viral in nature.  Patient discharged with symptomatic treatment.  Patient instructions given for warm saline nasal washes.  Recommendations for follow-up with primary care physician.     BP 141/87 mmHg  Pulse 87  Temp(Src) 98.2 F (36.8 C) (Oral)  Resp 20  Ht 5\' 10"  (1.778 m)  Wt 222 lb (100.699 kg)  BMI 31.85 kg/m2  SpO2 97%   Abigail Butts, PA-C 04/24/14 0055  April K Palumbo-Rasch, MD 04/24/14 [phone]

## 2014-04-24 NOTE — ED Notes (Signed)
Pt. reports sinus pressure with nasal congestion and headache onset yesterday . No fever or chills.

## 2014-08-04 IMAGING — CR DG HIP (WITH OR WITHOUT PELVIS) 2-3V*L*
2 series · 2 of 2 positions shown · non-contrast
Comparison: No priors.

CLINICAL DATA: History of fall complaining of left hip pain.

LEFT HIP - COMPLETE 2+ VIEW

[t hip ap left]
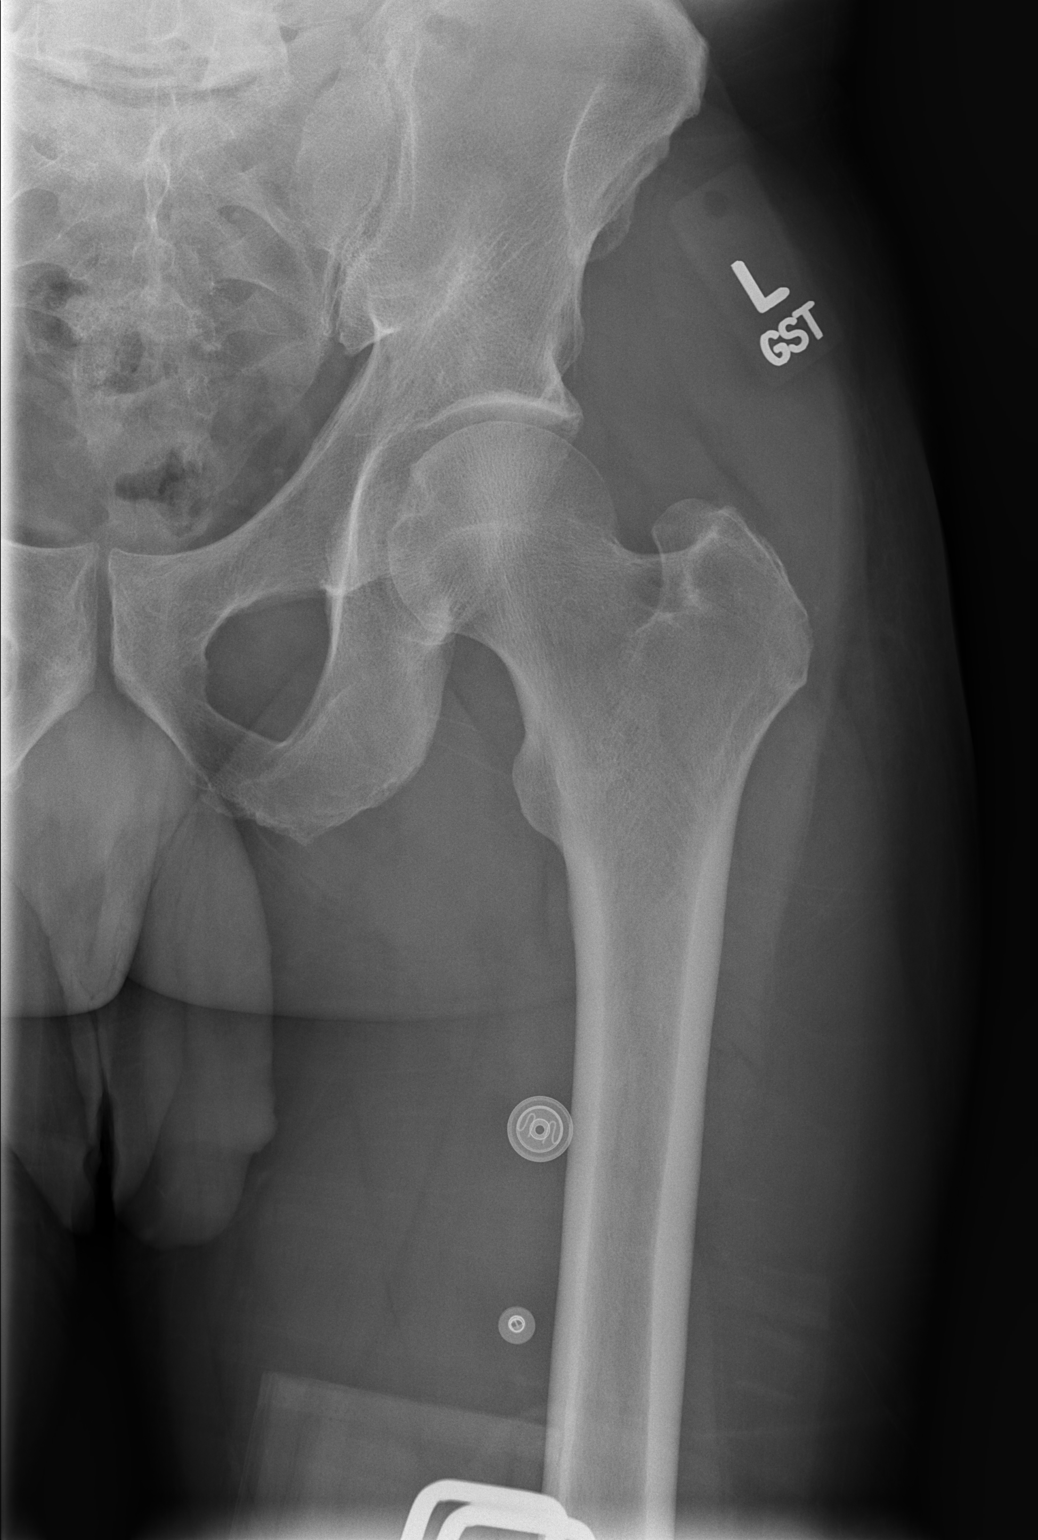

[t hip frog leg left]
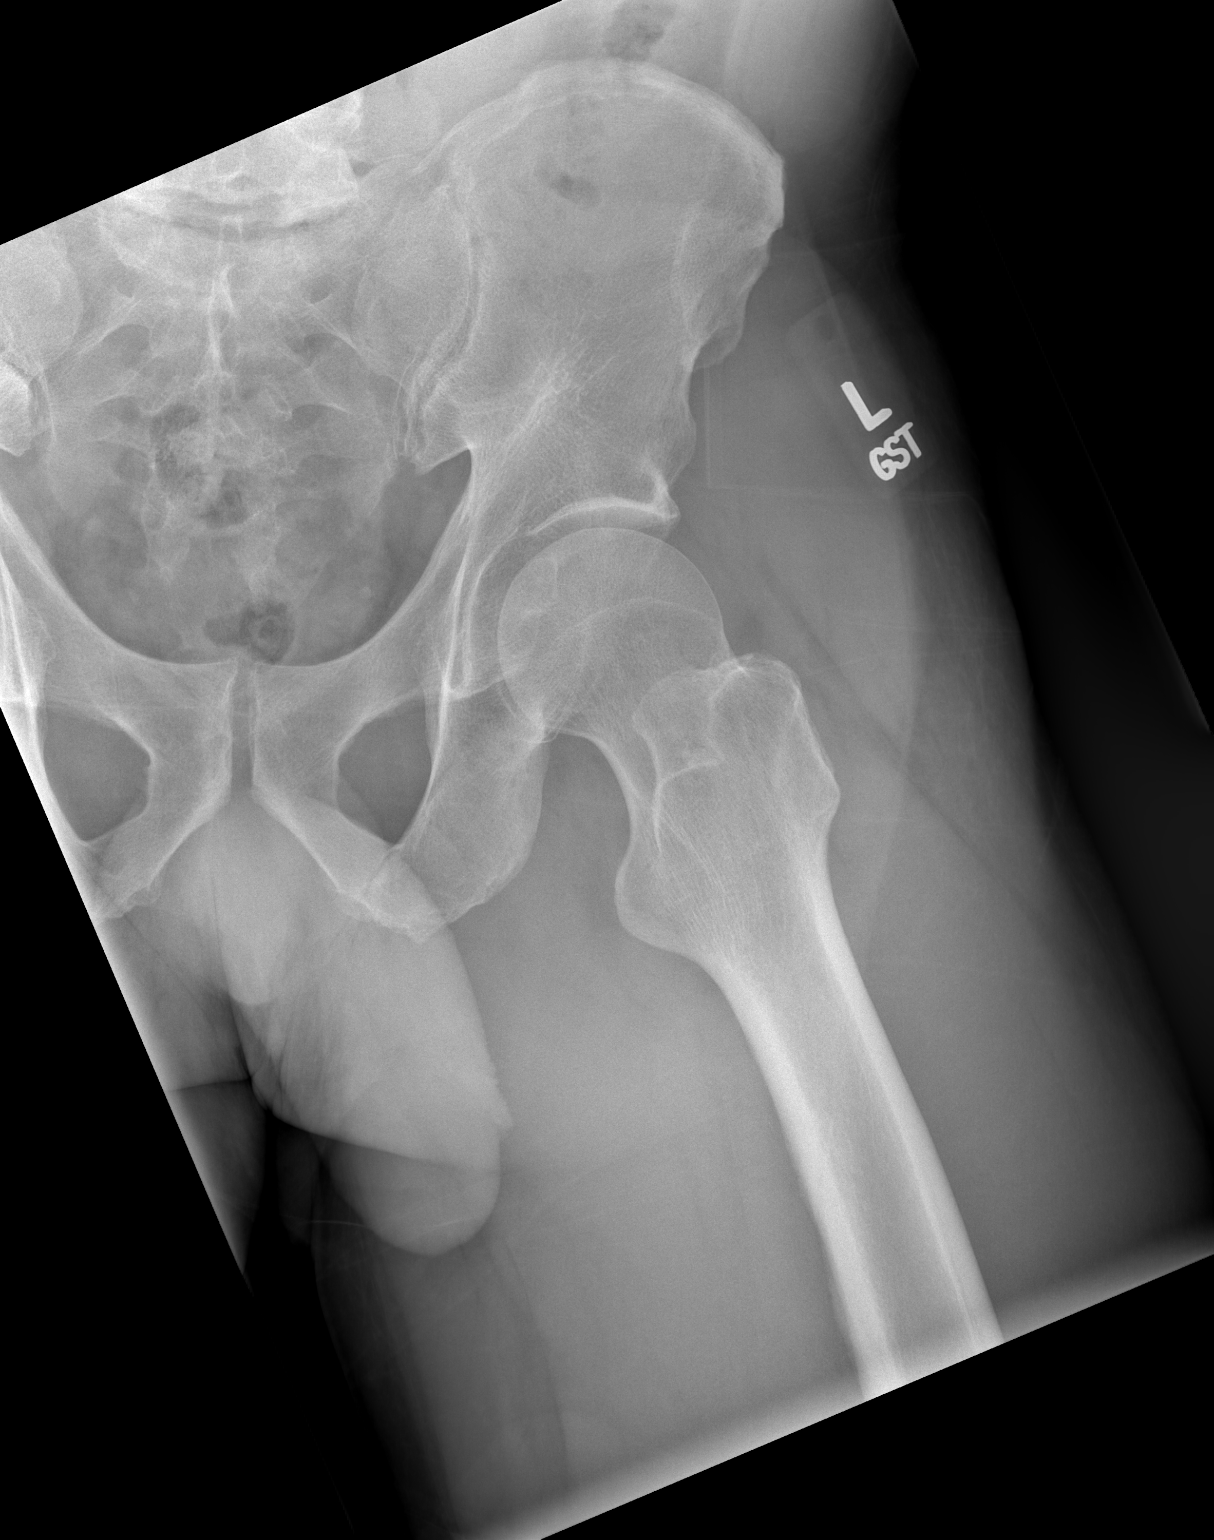

[2 of 2 positions shown; findings below may reference images not displayed]

FINDINGS: Two views of the left hip demonstrate no acute displaced
fracture, subluxation, dislocation, joint or soft tissue
abnormality.  Mild degenerative changes of osteoarthritis are
noted.
IMPRESSION: 1.  No acute radiographic abnormality of the left hip.
2.  Mild left hip osteoarthritis.

## 2014-08-04 IMAGING — CR DG CHEST 1V PORT
1 series · 1 of 1 positions shown · non-contrast
Comparison: No priors.

CLINICAL DATA: Chest pain.

PORTABLE CHEST - 1 VIEW

[AP]
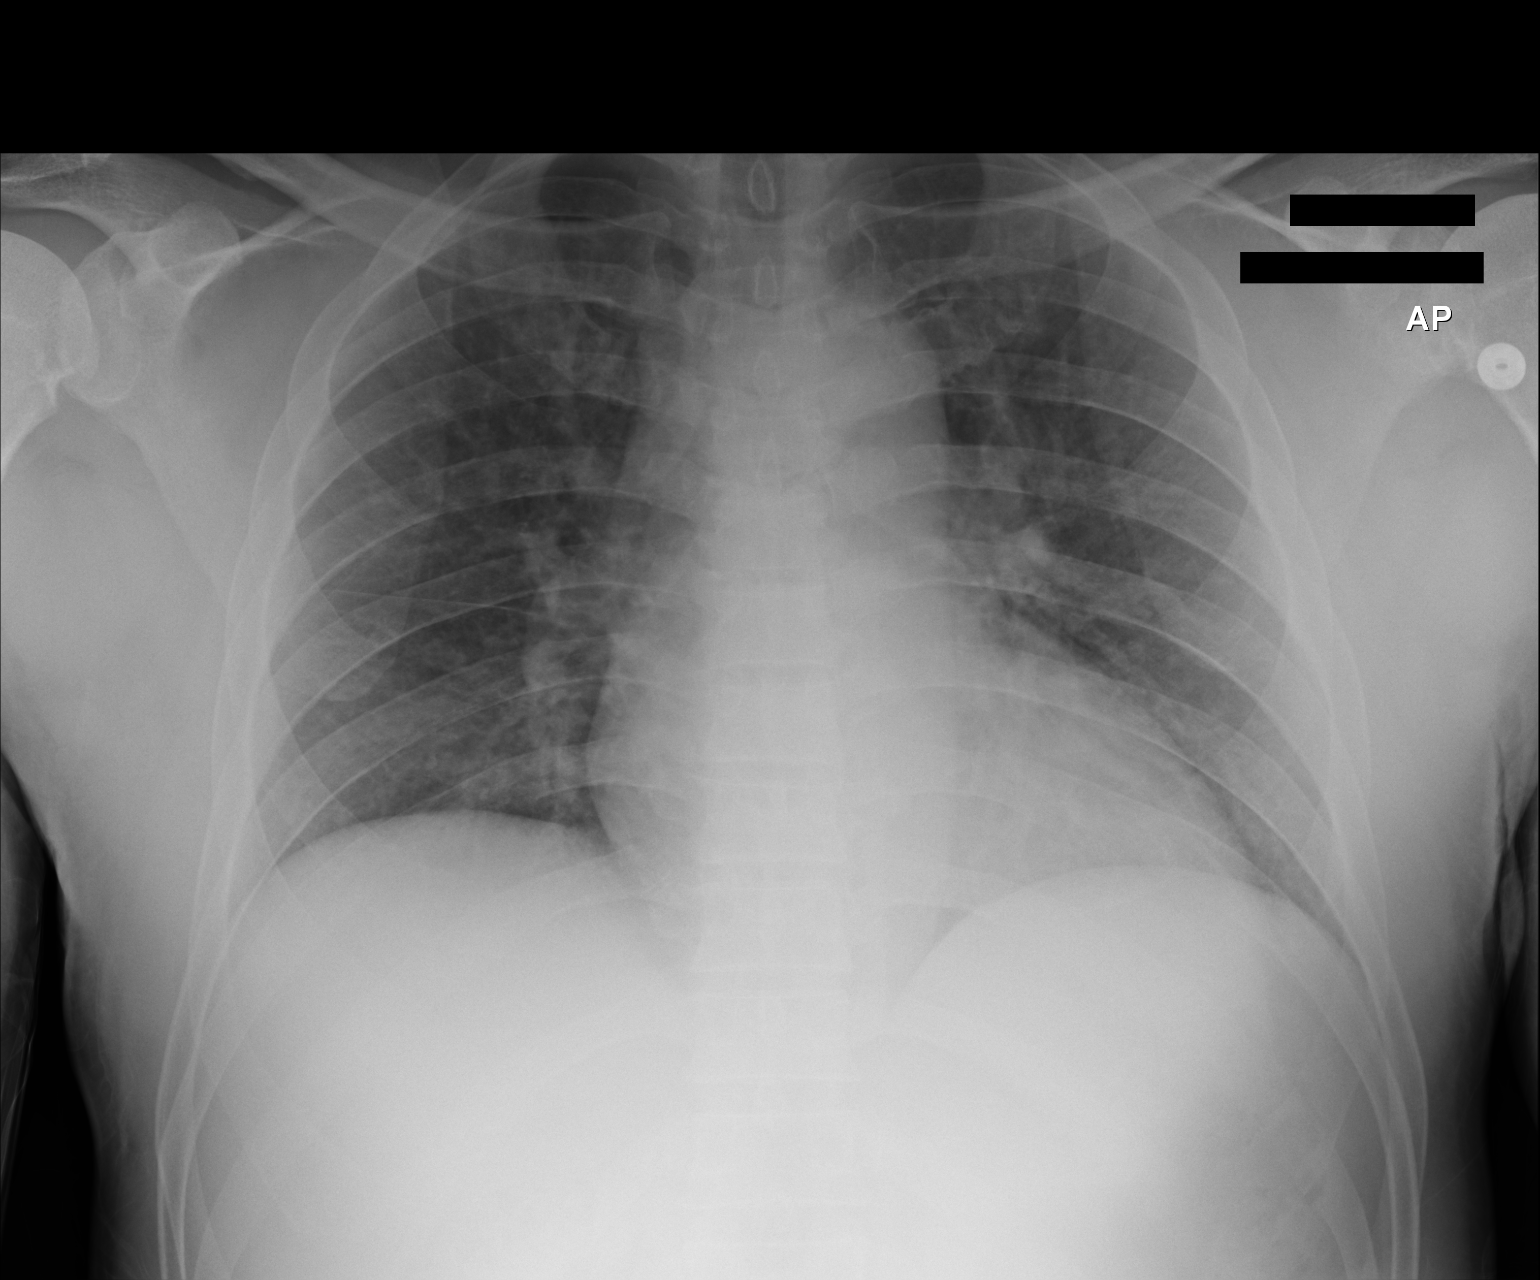

[1 of 1 positions shown; findings below may reference images not displayed]

FINDINGS: Lung volumes are normal.  No consolidative airspace
disease.  No pleural effusions.  No pneumothorax.  No pulmonary
nodule or mass noted.  Pulmonary vasculature and the
cardiomediastinal silhouette are within normal limits.
IMPRESSION: 1. No radiographic evidence of acute cardiopulmonary disease.

## 2021-05-25 ENCOUNTER — Encounter (HOSPITAL_COMMUNITY): Payer: Self-pay | Admitting: Emergency Medicine

## 2021-05-25 ENCOUNTER — Other Ambulatory Visit: Payer: Self-pay

## 2021-05-25 ENCOUNTER — Emergency Department (HOSPITAL_COMMUNITY)
Admission: EM | Admit: 2021-05-25 | Discharge: 2021-05-25 | Discharge status: Against Medical Advice | Payer: Self-pay | Attending: Emergency Medicine | Admitting: Emergency Medicine

## 2021-05-25 DIAGNOSIS — Z5321 Procedure and treatment not carried out due to patient leaving prior to being seen by health care provider: Secondary | ICD-10-CM | POA: Insufficient documentation

## 2021-05-25 DIAGNOSIS — R519 Headache, unspecified: Secondary | ICD-10-CM | POA: Diagnosis not present

## 2021-05-25 DIAGNOSIS — I10 Essential (primary) hypertension: Secondary | ICD-10-CM | POA: Diagnosis not present

## 2021-05-25 LAB — CBC
HCT: 45.4 % (ref 39.0–52.0)
Hemoglobin: 15.3 g/dL (ref 13.0–17.0)
MCH: 33.3 pg (ref 26.0–34.0)
MCHC: 33.7 g/dL (ref 30.0–36.0)
MCV: 98.9 fL (ref 80.0–100.0)
Platelets: 153 10*3/uL (ref 150–400)
RBC: 4.59 MIL/uL (ref 4.22–5.81)
RDW: 11.9 % (ref 11.5–15.5)
WBC: 6.3 10*3/uL (ref 4.0–10.5)
nRBC: 0 % (ref 0.0–0.2)

## 2021-05-25 LAB — COMPREHENSIVE METABOLIC PANEL
ALT: 81 U/L — ABNORMAL HIGH (ref 0–44)
AST: 58 U/L — ABNORMAL HIGH (ref 15–41)
Albumin: 3.6 g/dL (ref 3.5–5.0)
Alkaline Phosphatase: 76 U/L (ref 38–126)
Anion gap: 9 (ref 5–15)
BUN: 9 mg/dL (ref 6–20)
CO2: 24 mmol/L (ref 22–32)
Calcium: 9 mg/dL (ref 8.9–10.3)
Chloride: 104 mmol/L (ref 98–111)
Creatinine, Ser: 1.07 mg/dL (ref 0.61–1.24)
GFR, Estimated: >60 mL/min (ref >60)
Glucose, Bld: 90 mg/dL (ref 70–99)
Potassium: 4.1 mmol/L (ref 3.5–5.1)
Sodium: 137 mmol/L (ref 135–145)
Total Bilirubin: 1.1 mg/dL (ref 0.3–1.2)
Total Protein: 7.8 g/dL (ref 6.5–8.1)

## 2021-05-25 NOTE — ED Notes (Signed)
Called for repeat vitals no answer

## 2021-05-25 NOTE — ED Notes (Signed)
Pt called, multiple times, no answer

## 2021-05-25 NOTE — ED Triage Notes (Signed)
Pt reports headache that began at 1 am prior going to sleep. States when he woke headache was still present, c/o of photophobia. Speech clear, no droop or drift. Reports was on HTN meds 5 years ago but able to control with diet. No medical care in the last 5 years per pt.

## 2021-05-25 NOTE — ED Notes (Signed)
No answer in lobby.

## 2021-09-04 ENCOUNTER — Emergency Department (HOSPITAL_COMMUNITY)
Admission: EM | Admit: 2021-09-04 | Discharge: 2021-09-04 | Disposition: A | Discharge status: Home / Self Care | Payer: PRIVATE HEALTH INSURANCE | Attending: Emergency Medicine | Admitting: Emergency Medicine

## 2021-09-04 ENCOUNTER — Encounter (HOSPITAL_COMMUNITY): Payer: Self-pay

## 2021-09-04 ENCOUNTER — Other Ambulatory Visit: Payer: Self-pay

## 2021-09-04 DIAGNOSIS — Z202 Contact with and (suspected) exposure to infections with a predominantly sexual mode of transmission: Secondary | ICD-10-CM | POA: Diagnosis present

## 2021-09-04 DIAGNOSIS — Z711 Person with feared health complaint in whom no diagnosis is made: Secondary | ICD-10-CM

## 2021-09-04 DIAGNOSIS — Z79899 Other long term (current) drug therapy: Secondary | ICD-10-CM | POA: Insufficient documentation

## 2021-09-04 LAB — URINALYSIS, ROUTINE W REFLEX MICROSCOPIC
Bilirubin Urine: NEGATIVE
Glucose, UA: NEGATIVE mg/dL
Hgb urine dipstick: NEGATIVE
Ketones, ur: NEGATIVE mg/dL
Leukocytes,Ua: NEGATIVE
Nitrite: NEGATIVE
Protein, ur: NEGATIVE mg/dL
Specific Gravity, Urine: 1.013 (ref 1.005–1.030)
pH: 6 (ref 5.0–8.0)

## 2021-09-04 NOTE — ED Provider Triage Note (Addendum)
Emergency Medicine Provider Triage Evaluation Note ? ?Shaun Morgan , a 61 y.o. male  was evaluated in triage.  Pt complains of STD exposure. Asymptomatic. Denies any penile, testicular, scrotal, or urinary complaints. He reports that another guy the girl he was with tested positive so he wanted to be tested.  ? ?Review of Systems  ?Positive: See above ?Negative: See above ? ?Physical Exam  ?BP (!) 184/100 (BP Location: Left Arm)   Pulse 86   Temp 98.5 ?F (36.9 ?C) (Oral)   Resp 18   SpO2 96%  ?Gen:   Awake, no distress   ?Resp:  Normal effort  ?MSK:   Moves extremities without difficulty  ?Other:  GU exam deferred, the patient is asymptomatic.  ? ?Medical Decision Making  ?Medically screening exam initiated at 9:47 PM.  Appropriate orders placed.  Hunner Garcon was informed that the remainder of the evaluation will be completed by another provider, this initial triage assessment does not replace that evaluation, and the importance of remaining in the ED until their evaluation is complete. ? ?GC chlamydia and urinalysis ordered. Declines HIV and RPR testing. ?  ?Sherrell Puller, PA-C ?09/04/21 2149 ? ?  ?Sherrell Puller, PA-C ?09/04/21 2150 ? ?

## 2021-09-04 NOTE — ED Provider Notes (Signed)
?[facility] ?Provider Note ? ? ?CSN: [id number] ?Arrival date & time: 09/04/21  2036 ? ?  ? ?History ? ?Chief Complaint  ?Patient presents with  ? Exposure to STD  ? ? ?Shaun Morgan is a 61 y.o. male. ? ?Patient presents to ER chief complaint of concern for STD.  He states that he received oral sex 5 days in a row from a woman who later called him today and stated that she was concerned about a chlamydia infection.  She told the patient that she did not have it but she was being accused of having it.  Patient states the history he received was very unclear and he was concerned that he may have contracted an infection and presents to the ER.  Otherwise denies any penile discharge or pain or lesions.  Denies any fevers or cough or vomiting or diarrhea. ? ? ?  ? ?Home Medications ?Prior to Admission medications   ?Medication Sig Start Date End Date Taking? Authorizing Provider  ?albuterol (PROVENTIL HFA;VENTOLIN HFA) 108 (90 BASE) MCG/ACT inhaler Inhale 2 puffs into the lungs every 6 (six) hours as needed for wheezing or shortness of breath.    [provider]  ?amLODipine (NORVASC) 5 MG tablet Take 5 mg by mouth daily.    [provider]  ?fluticasone (FLONASE) 50 MCG/ACT nasal spray Place 2 sprays into both nostrils daily. 04/24/14   Muthersbaugh, Jarrett Soho, PA-C  ?hydrochlorothiazide (HYDRODIURIL) 25 MG tablet Take 12.5 mg by mouth daily.    [provider]  ?ibuprofen (ADVIL,MOTRIN) 800 MG tablet Take 1 tablet (800 mg total) by mouth 3 (three) times daily. 04/24/14   Muthersbaugh, Jarrett Soho, PA-C  ?   ? ?Allergies    ?Patient has no known allergies.   ? ?Review of Systems   ?Review of Systems  ?Constitutional:  Negative for fever.  ?HENT:  Negative for ear pain and sore throat.   ?Eyes:  Negative for pain.  ?Respiratory:  Negative for cough.   ?Cardiovascular:  Negative for chest pain.  ?Gastrointestinal:  Negative for abdominal pain.  ?Genitourinary:   Negative for flank pain.  ?Musculoskeletal:  Negative for back pain.  ?Skin:  Negative for color change and rash.  ?Neurological:  Negative for syncope.  ?All other systems reviewed and are negative. ? ?Physical Exam ?Updated Vital Signs ?BP (!) 160/113 (BP Location: Right Arm)   Pulse 86   Temp 98.5 ?F (36.9 ?C) (Oral)   Resp 18   Ht '5\' 10"'$  (1.778 m)   Wt 98.4 kg   SpO2 96%   BMI 31.14 kg/m?  ?Physical Exam ?Constitutional:   ?   Appearance: He is well-developed.  ?HENT:  ?   Head: Normocephalic.  ?   Nose: Nose normal.  ?Eyes:  ?   Extraocular Movements: Extraocular movements intact.  ?Cardiovascular:  ?   Rate and Rhythm: Normal rate.  ?Pulmonary:  ?   Effort: Pulmonary effort is normal.  ?Skin: ?   Coloration: Skin is not jaundiced.  ?Neurological:  ?   Mental Status: He is alert. Mental status is at baseline.  ? ? ?ED Results / Procedures / Treatments   ?Labs ?(all labs ordered are listed, but only abnormal results are displayed) ?Labs Reviewed  ?URINALYSIS, ROUTINE W REFLEX MICROSCOPIC  ?GC/CHLAMYDIA PROBE AMP ([facility]) NOT AT [facility]  ? ? ?EKG ?None ? ?Radiology ?No results found. ? ?Procedures ?Procedures  ? ? ?Medications Ordered in ED ?Medications - No data to display ? ?  ED Course/ Medical Decision Making/ A&P ?  ?                        ?Medical Decision Making ? ?Chart review shows visit in February 2023 left flank pain. ? ?Cardiac monitor shows sinus rhythm. ? ?Patient otherwise well-appearing, urinalysis unremarkable STD test sent and pending result.  Patient advised outpatient follow-up with his doctor within the week and to follow-up with his results on his MyChart.  Advised immediate return if he has fevers or any additional concerns.  Advise no sexual intercourse until his results return negative. ? ? ? ? ? ? ? ? ?Final Clinical Impression(s) / ED Diagnoses ?Final diagnoses:  ?Concern about STD in male without diagnosis  ? ? ?Rx / DC Orders ?ED Discharge Orders   ? ? None  ? ?  ? ? ?   ?Luna Fuse, MD ?09/04/21 2316 ? ?

## 2021-09-04 NOTE — ED Notes (Signed)
All discharge instructions reviewed with patient including follow up care and patient verbalized understanding. Patient stable and ambulatory at time of discharge.  ?

## 2021-09-04 NOTE — ED Triage Notes (Signed)
Pt requesting to be check for std ? ?

## 2021-09-04 NOTE — Discharge Instructions (Addendum)
Do not have sex until your results have returned negative. ?Call your primary care doctor or specialist as discussed in the next 2-3 days.   ?Return immediately back to the ER if: ? ?Your symptoms worsen within the next 12-24 hours. ?You develop new symptoms such as new fevers, persistent vomiting, new pain, shortness of breath, or new weakness or numbness, or if you have any other concerns. ? ?

## 2021-09-07 LAB — GC/CHLAMYDIA PROBE AMP (~~LOC~~) NOT AT ARMC
Chlamydia: NEGATIVE
Comment: NEGATIVE
Comment: NORMAL
Neisseria Gonorrhea: NEGATIVE

## 2021-09-09 DIAGNOSIS — Z1152 Encounter for screening for COVID-19: Secondary | ICD-10-CM | POA: Diagnosis not present

## 2021-09-14 DIAGNOSIS — Z1152 Encounter for screening for COVID-19: Secondary | ICD-10-CM | POA: Diagnosis not present

## 2021-09-21 ENCOUNTER — Encounter (HOSPITAL_COMMUNITY): Payer: Self-pay

## 2021-09-21 ENCOUNTER — Emergency Department (HOSPITAL_COMMUNITY)
Admission: EM | Admit: 2021-09-21 | Discharge: 2021-09-22 | Disposition: A | Discharge status: Home / Self Care | Payer: PRIVATE HEALTH INSURANCE | Attending: Emergency Medicine | Admitting: Emergency Medicine

## 2021-09-21 ENCOUNTER — Other Ambulatory Visit: Payer: Self-pay

## 2021-09-21 DIAGNOSIS — J45909 Unspecified asthma, uncomplicated: Secondary | ICD-10-CM | POA: Insufficient documentation

## 2021-09-21 DIAGNOSIS — Z79899 Other long term (current) drug therapy: Secondary | ICD-10-CM | POA: Insufficient documentation

## 2021-09-21 DIAGNOSIS — J029 Acute pharyngitis, unspecified: Secondary | ICD-10-CM | POA: Insufficient documentation

## 2021-09-21 DIAGNOSIS — Z20822 Contact with and (suspected) exposure to covid-19: Secondary | ICD-10-CM | POA: Insufficient documentation

## 2021-09-21 DIAGNOSIS — R0981 Nasal congestion: Secondary | ICD-10-CM | POA: Insufficient documentation

## 2021-09-21 DIAGNOSIS — I10 Essential (primary) hypertension: Secondary | ICD-10-CM | POA: Insufficient documentation

## 2021-09-21 LAB — RESP PANEL BY RT-PCR (FLU A&B, COVID) ARPGX2
Influenza A by PCR: NEGATIVE
Influenza B by PCR: NEGATIVE
SARS Coronavirus 2 by RT PCR: NEGATIVE

## 2021-09-21 NOTE — ED Provider Triage Note (Signed)
Emergency Medicine Provider Triage Evaluation Note ? ?Shaun Morgan , a 61 y.o. male  was evaluated in triage.  Pt complains of sore throat and swollen lymph nodes.  Patient started on Saturday.  Symptoms have been constant and progressively worsening since then. ? ?Patient denies any fever, chills, drooling, trismus, hot potato voice, neck stiffness, rhinorrhea, nasal congestion, cough. ? ?Review of Systems  ?Positive: See above ?Negative: See above ? ?Physical Exam  ?BP (!) 168/108   Pulse 92   Temp 98.7 ?F (37.1 ?C) (Oral)   Resp 16   SpO2 98%  ?Gen:   Awake, no distress   ?Resp:  Normal effort  ?MSK:   Moves extremities without difficulty  ?Other:  No swelling to submandibular space.  No swelling, erythema, or exudate to tonsils bilaterally.  Patient handles oral secretions without difficulty.  Neck is supple.  No pain with passive range of motion of neck.  Swelling to anterior cervical lymph nodes bilaterally ? ?Medical Decision Making  ?Medically screening exam initiated at 8:47 PM.  Appropriate orders placed.  Bartley Vuolo was informed that the remainder of the evaluation will be completed by another provider, this initial triage assessment does not replace that evaluation, and the importance of remaining in the ED until their evaluation is complete. ? ? ?  ?Loni Beckwith, PA-C ?09/21/21 2049 ? ?

## 2021-09-21 NOTE — ED Triage Notes (Signed)
Pt began having sore throat on Saturday. No other complaints. No trouble swallowing ?

## 2021-09-22 LAB — GROUP A STREP BY PCR: Group A Strep by PCR: NOT DETECTED

## 2021-09-22 MED ORDER — NAPROXEN 500 MG PO TBEC: 500.0000 mg | DELAYED_RELEASE_TABLET | Freq: Two times a day (BID) | ORAL | 0 refills | Status: DC | PRN

## 2021-09-22 MED ORDER — FLUTICASONE PROPIONATE 50 MCG/ACT NA SUSP: 1.0000 | Freq: Every day | NASAL | 0 refills | Status: DC

## 2021-09-22 MED ORDER — CETIRIZINE HCL 10 MG PO TABS: 10.0000 mg | ORAL_TABLET | Freq: Every day | ORAL | 0 refills | Status: DC

## 2021-09-22 MED ORDER — NAPROXEN 250 MG PO TABS
500.0000 mg | ORAL_TABLET | Freq: Once | ORAL | Status: AC
  Administered 2021-09-22: 500 mg via ORAL
  Filled 2021-09-22 (×2): qty 2

## 2021-09-22 NOTE — Discharge Instructions (Addendum)
You were seen in the emergency department today for sore throat.  Your strep, COVID, and flu test are negative.  We suspect that your sore throat may be allergy or virus related- we are sending home with the following medicines: ? ?- Naproxen- this is a nonsteroidal anti-inflammatory medication that will help with pain and swelling. Be sure to take this medication as prescribed with food, 1 pill every 12 hours,  It should be taken with food, as it can cause stomach upset, and more seriously, stomach bleeding. Do not take other nonsteroidal anti-inflammatory medications with this such as Advil, Motrin, Aleve, Mobic, Goodie Powder, or Motrin etc..   ? ?- Flonase- Use 1 spray per nostril daily as needed for congestion.  ? ?- Zyrtec- take 1 tablet daily for allergies.  ? ?You make take Tylenol per over the counter dosing with these medications.  ? ?We have prescribed you new medication(s) today. Discuss the medications prescribed today with your pharmacist as they can have adverse effects and interactions with your other medicines including over the counter and prescribed medications. Seek medical evaluation if you start to experience new or abnormal symptoms after taking one of these medicines, seek care immediately if you start to experience difficulty breathing, feeling of your throat closing, facial swelling, or rash as these could be indications of a more serious allergic reaction ? ? ?Follow-up with primary care in 1 week, have your blood pressure rechecked this appointment as it was elevated in the ER.  Return to the ER for new or worsening symptoms or any other concerns. ? ?

## 2021-09-22 NOTE — ED Provider Notes (Signed)
?[facility] ?Provider Note ? ? ?CSN: [id number] ?Arrival date & time: 09/21/21  2004 ? ?  ? ?History ? ?Chief Complaint  ?Patient presents with  ? Sore Throat  ? ? ?Shaun Morgan is a 61 y.o. male with a hx of asthma & HTN who presents to the ED with complaints of sore throat x 2-3 days. Sore throat is constant, worse with swallowing, but able to swallow. Associated nasal congestion. Denies fever, chest pain, dyspnea, nausea, or vomiting.  ? ?HPI ? ?  ? ?Home Medications ?Prior to Admission medications   ?Medication Sig Start Date End Date Taking? Authorizing Provider  ?albuterol (PROVENTIL HFA;VENTOLIN HFA) 108 (90 BASE) MCG/ACT inhaler Inhale 2 puffs into the lungs every 6 (six) hours as needed for wheezing or shortness of breath.    [provider]  ?amLODipine (NORVASC) 5 MG tablet Take 5 mg by mouth daily.    [provider]  ?fluticasone (FLONASE) 50 MCG/ACT nasal spray Place 2 sprays into both nostrils daily. 04/24/14   Muthersbaugh, Jarrett Soho, PA-C  ?hydrochlorothiazide (HYDRODIURIL) 25 MG tablet Take 12.5 mg by mouth daily.    [provider]  ?ibuprofen (ADVIL,MOTRIN) 800 MG tablet Take 1 tablet (800 mg total) by mouth 3 (three) times daily. 04/24/14   Muthersbaugh, Jarrett Soho, PA-C  ?   ? ?Allergies    ?Patient has no known allergies.   ? ?Review of Systems   ?Review of Systems  ?Constitutional:  Negative for chills and fever.  ?HENT:  Positive for congestion and sore throat.   ?Respiratory:  Negative for cough and shortness of breath.   ?Cardiovascular:  Negative for chest pain.  ?Gastrointestinal:  Negative for abdominal pain, nausea and vomiting.  ?All other systems reviewed and are negative. ? ?Physical Exam ?Updated Vital Signs ?BP (!) 168/108   Pulse 92   Temp 98.7 ?F (37.1 ?C) (Oral)   Resp 16   Ht '5\' 9"'$  (1.753 m)   Wt 98.4 kg   SpO2 98%   BMI 32.05 kg/m?  ?Physical Exam ?Vitals and nursing note reviewed.  ?Constitutional:   ?    General: He is not in acute distress. ?   Appearance: He is well-developed. He is not toxic-appearing.  ?HENT:  ?   Head: Normocephalic and atraumatic.  ?   Right Ear: Ear canal normal. Tympanic membrane is not perforated, erythematous, retracted or bulging.  ?   Left Ear: Ear canal normal. Tympanic membrane is not perforated, erythematous, retracted or bulging.  ?   Ears:  ?   Comments: No mastoid erythema/swellng/tenderness.  ?   Nose:  ?   Right Sinus: No maxillary sinus tenderness or frontal sinus tenderness.  ?   Left Sinus: No maxillary sinus tenderness or frontal sinus tenderness.  ?   Mouth/Throat:  ?   Pharynx: Oropharynx is clear. Uvula midline. Posterior oropharyngeal erythema present. No oropharyngeal exudate.  ?   Comments: Posterior oropharynx is symmetric appearing. Patient tolerating own secretions without difficulty. No trismus. No drooling. No hot potato voice. No swelling beneath the tongue, submandibular compartment is soft.  ?Eyes:  ?   General:     ?   Right eye: No discharge.     ?   Left eye: No discharge.  ?   Conjunctiva/sclera: Conjunctivae normal.  ?Cardiovascular:  ?   Rate and Rhythm: Normal rate and regular rhythm.  ?Pulmonary:  ?   Effort: Pulmonary effort is normal. No respiratory distress.  ?   Breath  sounds: Normal breath sounds. No wheezing, rhonchi or rales.  ?Abdominal:  ?   General: There is no distension.  ?   Palpations: Abdomen is soft.  ?   Tenderness: There is no abdominal tenderness.  ?Musculoskeletal:  ?   Cervical back: Neck supple. No rigidity.  ?Lymphadenopathy:  ?   Cervical: No cervical adenopathy.  ?Skin: ?   General: Skin is warm and dry.  ?   Findings: No rash.  ?Neurological:  ?   Mental Status: He is alert.  ?Psychiatric:     ?   Behavior: Behavior normal.  ? ? ?ED Results / Procedures / Treatments   ?Labs ?(all labs ordered are listed, but only abnormal results are displayed) ?Labs Reviewed  ?RESP PANEL BY RT-PCR (FLU A&B, COVID) ARPGX2  ?GROUP A STREP BY PCR   ? ? ?EKG ?None ? ?Radiology ?No results found. ? ?Procedures ?Procedures  ? ? ?Medications Ordered in ED ?Medications  ?naproxen (NAPROSYN) tablet 500 mg (has no administration in time range)  ? ? ?ED Course/ Medical Decision Making/ A&P ?  ?                        ?Medical Decision Making ? ?Patient presents to the ED with complaints of sore throat x 2-3 days.  ?Nontoxic, BP elevated- doubt HTN emergency.  ? ?Chart reviewed for additional hx- external records reviewed- last creatinine WNL.  ? ?I viewed & interpreted labs- covid, flu, strep- negative.  ? ?Exam not consistent w/ RPA/PTA.  ?Strep negative ?No signs of AOM, AOE, or mastoiditis.  ?No sinus tenderness, sxs < 10 days, doubt acute bacterial sinusitis.  ?No meningismus.  ?No respiratory sxs, lungs CTA- doubt pneumonia.  ? ?Suspect viral vs. Allergic- appears appropriate for discharge with supportive care. I discussed results, treatment plan, need for follow-up, and return precautions with the patient. Provided opportunity for questions, patient confirmed understanding and is in agreement with plan.  ? ? ?Final Clinical Impression(s) / ED Diagnoses ?Final diagnoses:  ?Sore throat  ? ? ?Rx / DC Orders ?ED Discharge Orders   ? ?      Ordered  ?  naproxen (EC-NAPROXEN) 500 MG EC tablet  2 times daily PRN       ? 09/22/21 0118  ?  cetirizine (ZYRTEC ALLERGY) 10 MG tablet  Daily       ? 09/22/21 0118  ?  fluticasone (FLONASE) 50 MCG/ACT nasal spray  Daily       ? 09/22/21 0118  ? ?  ?  ? ?  ? ? ?  ?Amaryllis Dyke, PA-C ?09/22/21 0122 ? ?  ?Maudie Flakes, MD ?09/22/21 [phone] ? ?

## 2021-09-25 DIAGNOSIS — Z1152 Encounter for screening for COVID-19: Secondary | ICD-10-CM | POA: Diagnosis not present

## 2022-05-06 ENCOUNTER — Other Ambulatory Visit (HOSPITAL_COMMUNITY): Payer: Self-pay | Admitting: Urology

## 2022-05-06 DIAGNOSIS — C61 Malignant neoplasm of prostate: Secondary | ICD-10-CM

## 2022-05-20 ENCOUNTER — Ambulatory Visit (HOSPITAL_COMMUNITY)
Admission: RE | Admit: 2022-05-20 | Discharge: 2022-05-20 | Disposition: A | Discharge status: Home / Self Care | Source: Ambulatory Visit | Attending: Urology | Admitting: Urology

## 2022-05-20 DIAGNOSIS — C61 Malignant neoplasm of prostate: Secondary | ICD-10-CM | POA: Diagnosis present

## 2022-05-20 MED ORDER — PIFLIFOLASTAT F 18 (PYLARIFY) INJECTION
9.0000 | Freq: Once | INTRAVENOUS | Status: AC
  Administered 2022-05-20: 9.3 via INTRAVENOUS

## 2022-06-24 ENCOUNTER — Telehealth: Payer: Self-pay | Admitting: Radiation Oncology

## 2022-06-24 NOTE — Telephone Encounter (Signed)
LVM for main line to medical dept to schedule consult for patient.  Used main nurse number, VM states she will be absent until further notice.  Called alternative number listed and spoke with staff member who transferred me to [phone]; LVM on this medical line as well.

## 2022-06-29 ENCOUNTER — Telehealth: Payer: Self-pay | Admitting: Radiation Oncology

## 2022-06-29 NOTE — Telephone Encounter (Signed)
LVM to schedule CON for patient on main nursing line.

## 2022-07-07 NOTE — Progress Notes (Signed)
GU Location of Tumor / Histology: Prostate Ca  If Prostate Cancer, Gleason Score is (4 + 3) and PSA is (27.7 on 12/2021)  Biopsies      05/20/2022 Dr. Kris Hartmann NM PET (PSMA) Skull Base to Mid Thigh Clinical Data:  High-risk prostate carcinoma    Past/Anticipated interventions by urology, if any: NA  Past/Anticipated interventions by medical oncology, if any: NA  Weight changes, if any:  No  IPSS:  20 SHIM:  0  Bowel/Bladder complaints, if any:  No bowel issues normal. No bladder issues.  Nausea/Vomiting, if any:  No  Pain issues, if any:  4/10 left kidney and 10/10 right leg  SAFETY ISSUES: Prior radiation? No Pacemaker/ICD? No Possible current pregnancy?  Male Is the patient on methotrexate? No  Current Complaints / other details:

## 2022-07-12 ENCOUNTER — Encounter: Payer: Self-pay | Admitting: Urology

## 2022-07-12 DIAGNOSIS — C61 Malignant neoplasm of prostate: Secondary | ICD-10-CM | POA: Insufficient documentation

## 2022-07-12 NOTE — Progress Notes (Signed)
Radiation Oncology         [phone] ________________________________  Initial Outpatient Consultation  Name: Shaun Morgan MRN: [id number]  Date: 07/13/2022  DOB: [birth year]  [id number], Audrea Muscat, NP  Raynelle Bring, MD   REFERRING PHYSICIAN: Raynelle Bring, MD  DIAGNOSIS: 62 y.o. 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  No diagnosis found.  HISTORY OF PRESENT ILLNESS: Shaun Morgan is a 61 y.o. male with a diagnosis of prostate cancer. He presented with LUTS, as well as a history of an elevated PSA of 27.7.  Accordingly, he was referred for evaluation in urology by Dr. Claudia Desanctis on 02/18/22,  digital rectal examination performed at that time showed a firm/nodular left prostate lobe.  The patient proceeded to transrectal ultrasound with 12 biopsies of the prostate on 04/02/22.  The prostate volume measured 48.5 cc.  Out of 12 core biopsies, 10 were positive.  The maximum Gleason score was 4+4, and this was seen in left base. Additionally, Gleason 4+3 was seen in right apex lateral, left mid, and left base lateral (two foci), Gleason 3+4 in left mid lateral (with perineural invasion), right base, right mid, and right base lateral, and Gleason 3+3 in right mid lateral and right apex.  He underwent staging PSMA PET scan on 05/20/22 showing: focal radiotracer uptake in posterior a[ex and base of prostate, with probable seminal vesicle involvement; no evidence of metastatic disease.  The patient reviewed the biopsy results with his urologist and he has kindly been referred today for discussion of potential radiation treatment options. He previously met with Dr. Alinda Money on 06/15/22 to discuss surgical treatment options.    PREVIOUS RADIATION THERAPY: No  PAST MEDICAL HISTORY:  Past Medical History:  Diagnosis Date   Asthma    Hypertension       PAST SURGICAL HISTORY: Past Surgical History:  Procedure Laterality Date   CARPAL  TUNNEL RELEASE      FAMILY HISTORY: No family history on file.  SOCIAL HISTORY:  Social History   Socioeconomic History   Marital status: Single    Spouse name: Not on file   Number of children: Not on file   Years of education: Not on file   Highest education level: Not on file  Occupational History   Not on file  Tobacco Use   Smoking status: Some Days    Types: Cigarettes, Pipe   Smokeless tobacco: Not on file  Substance and Sexual Activity   Alcohol use: No   Drug use: Never   Sexual activity: Not on file  Other Topics Concern   Not on file  Social History Narrative   ** Merged History Encounter **       Social Determinants of Health   Financial Resource Strain: Not on file  Food Insecurity: Not on file  Transportation Needs: Not on file  Physical Activity: Not on file  Stress: Not on file  Social Connections: Not on file  Intimate Partner Violence: Not on file    ALLERGIES: Patient has no known allergies.  MEDICATIONS:  Current Outpatient Medications  Medication Sig Dispense Refill   albuterol (PROVENTIL HFA;VENTOLIN HFA) 108 (90 BASE) MCG/ACT inhaler Inhale 2 puffs into the lungs every 6 (six) hours as needed for wheezing or shortness of breath.     amLODipine (NORVASC) 5 MG tablet Take 5 mg by mouth daily.     cetirizine (ZYRTEC ALLERGY) 10 MG tablet Take 1 tablet (10 mg total)  by mouth daily. 14 tablet 0   fluticasone (FLONASE) 50 MCG/ACT nasal spray Place 1 spray into both nostrils daily. 16 g 0   hydrochlorothiazide (HYDRODIURIL) 25 MG tablet Take 12.5 mg by mouth daily.     ibuprofen (ADVIL,MOTRIN) 800 MG tablet Take 1 tablet (800 mg total) by mouth 3 (three) times daily. 21 tablet 0   naproxen (EC-NAPROXEN) 500 MG EC tablet Take 1 tablet (500 mg total) by mouth 2 (two) times daily as needed. 10 tablet 0   No current facility-administered medications for this encounter.    REVIEW OF SYSTEMS:  On review of systems, the patient reports that he is  doing well overall. He denies any chest pain, shortness of breath, cough, fevers, chills, night sweats, unintended weight changes. He denies any bowel disturbances, and denies abdominal pain, nausea or vomiting. He denies any new musculoskeletal or joint aches or pains. His IPSS was ***, indicating *** urinary symptoms. His SHIM was ***, indicating he {does not have/has mild/moderate/severe} erectile dysfunction. A complete review of systems is obtained and is otherwise negative.    PHYSICAL EXAM:  Wt Readings from Last 3 Encounters:  09/21/21 217 lb (98.4 kg)  09/04/21 217 lb (98.4 kg)  05/25/21 220 lb (99.8 kg)   Temp Readings from Last 3 Encounters:  09/22/21 98.2 F (36.8 C) (Oral)  09/04/21 98.3 F (36.8 C) (Oral)  05/25/21 98.3 F (36.8 C) (Oral)   BP Readings from Last 3 Encounters:  09/22/21 (!) 165/108  09/04/21 (!) 160/113  05/25/21 (!) 147/92   Pulse Readings from Last 3 Encounters:  09/22/21 74  09/04/21 80  05/25/21 93    /10  In general this is a well appearing *** male in no acute distress. He's alert and oriented x4 and appropriate throughout the examination. Cardiopulmonary assessment is negative for acute distress, and he exhibits normal effort.     KPS = ***  100 - Normal; no complaints; no evidence of disease. 90   - Able to carry on normal activity; minor signs or symptoms of disease. 80   - Normal activity with effort; some signs or symptoms of disease. 81   - Cares for self; unable to carry on normal activity or to do active work. 60   - Requires occasional assistance, but is able to care for most of his personal needs. 50   - Requires considerable assistance and frequent medical care. 36   - Disabled; requires special care and assistance. 82   - Severely disabled; hospital admission is indicated although death not imminent. 26   - Very sick; hospital admission necessary; active supportive treatment necessary. 10   - Moribund; fatal processes  progressing rapidly. 0     - Dead  Karnofsky DA, Abelmann [place], Craver LS and Burchenal [facility] [phone]) The use of the nitrogen mustards in the palliative treatment of carcinoma: with particular reference to bronchogenic carcinoma Cancer 1 634-56  LABORATORY DATA:  Lab Results  Component Value Date   WBC 6.3 05/25/2021   HGB 15.3 05/25/2021   HCT 45.4 05/25/2021   MCV 98.9 05/25/2021   PLT 153 05/25/2021   Lab Results  Component Value Date   NA 137 05/25/2021   K 4.1 05/25/2021   CL 104 05/25/2021   CO2 24 05/25/2021   Lab Results  Component Value Date   ALT 81 (H) 05/25/2021   AST 58 (H) 05/25/2021   ALKPHOS 76 05/25/2021   BILITOT 1.1 05/25/2021     RADIOGRAPHY: No  results found.    IMPRESSION/PLAN: 1. 62 y.o. 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 We discussed the patient's workup and outlined the nature of prostate cancer in this setting. The patient's T stage, Gleason's score, and PSA put him into the high risk group. Accordingly, he is eligible for a variety of potential treatment options including LT-ADT in combination with 8 weeks of external radiation, 5 weeks of external radiation with an upfront brachytherapy boost, or prostatectomy. We discussed the available radiation techniques, and focused on the details and logistics of delivery. We discussed and outlined the risks, benefits, short and long-term effects associated with radiotherapy and compared and contrasted these with prostatectomy. We discussed the role of SpaceOAR gel in reducing the rectal toxicity associated with radiotherapy. We also detailed the role of ADT in the treatment of high risk prostate cancer and outlined the associated side effects that could be expected with this therapy. He appears to have a good understanding of his disease and our treatment recommendations which are of curative intent.  He was encouraged to ask questions that were answered  to his stated satisfaction.  At the conclusion of our conversation, the patient is interested in moving forward with ***.   We personally spent *** minutes in this encounter including chart review, reviewing radiological studies, meeting face-to-face with the patient, entering orders and completing documentation.   ------------------------------------------------   Tyler Pita, MD [facility]: [phone]  Fax: [phone] [email or web address]  Skype  [email or web address]   This document serves as a record of services personally performed by Tyler Pita, MD. It was created on his behalf by Wilburn Mylar, a trained medical scribe. The creation of this record is based on the scribe's personal observations and the provider's statements to them. This document has been checked and approved by the attending provider.

## 2022-07-13 ENCOUNTER — Ambulatory Visit
Admission: RE | Admit: 2022-07-13 | Discharge: 2022-07-13 | Disposition: A | Discharge status: Home / Self Care | Source: Ambulatory Visit | Attending: Radiation Oncology | Admitting: Radiation Oncology

## 2022-07-13 ENCOUNTER — Other Ambulatory Visit: Payer: Self-pay

## 2022-07-13 ENCOUNTER — Telehealth: Payer: Self-pay | Admitting: *Deleted

## 2022-07-13 VITALS — BP 111/87 | HR 82 | Temp 97.9°F | Resp 20 | Ht 69.0 in | Wt 201.2 lb

## 2022-07-13 DIAGNOSIS — C61 Malignant neoplasm of prostate: Secondary | ICD-10-CM

## 2022-07-13 NOTE — Telephone Encounter (Signed)
Called patient to inform of appt. with Dr. Claudia Desanctis on 07-16-22- arrival - 8:45 am for ADT, lvm for a return call

## 2022-07-13 NOTE — Progress Notes (Signed)
Introduced myself to the patient as the prostate nurse navigator.  He is here to discuss his radiation treatment options. Pt is currently incarcerated and is set to be released sometime in March.  We reviewed potential change in barriers once released which included transportation and insurance.  RN will access once treatment plan is finalized and reach out to appropriate staff to address future barriers.  I gave him my business card and asked him to call me with questions or concerns.  Verbalized understanding.

## 2022-07-16 NOTE — Progress Notes (Signed)
Left voicemail with Sharyn Lull, nurse at [facility], to update regarding upcoming ADT appointment on 2/21 @ 2pm.

## 2022-07-19 NOTE — Progress Notes (Signed)
RN left voicemail with [facility] requesting call back to review upcoming appointment on 2/21.

## 2022-07-19 NOTE — Progress Notes (Signed)
Additional voicemail left for call back regarding upcoming ADT appointment.  RN contacted operator at [facility] requesting to speak someone directly in medical to ensure arrangements, they were unable to reach anyone so additional voicemail left.   Appointment information faxed to [phone] as well.   Will continue to follow.

## 2022-07-20 ENCOUNTER — Telehealth: Payer: Self-pay | Admitting: *Deleted

## 2022-07-20 NOTE — Progress Notes (Signed)
RN spoke with Clarene Critchley at the [facility] and notified her of patient's upcoming appointment @ [facility] 2/21 @ 2:15pm.  Provided direct contact information to Clarene Critchley, she agreeable to contact me if there are any conflicts with transpiration from [facility] to [facility].

## 2022-07-20 NOTE — Telephone Encounter (Signed)
CALLED PATIENT'S NURSE- KERRI TO INFORM OF ADT APPT. FOR 07-21-22 - ARRIVAL TIME- 2:15 PM @ DR. PACE'S OFFICE, SPOKE WITH KERRI AND SHE IS AWARE OF THIS APPT.

## 2022-07-22 NOTE — Progress Notes (Signed)
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   Patient remains incarcerated and RN will follow to ensure radiation gets started once released, projected for March.

## 2022-08-03 ENCOUNTER — Other Ambulatory Visit: Payer: Self-pay | Admitting: Urology

## 2022-08-10 NOTE — Progress Notes (Signed)
RN spoke with Morey Hummingbird @ [facility] and confirmed upcoming appointments for fiducial's, and CT Sim.   Carrie aware, and will arrange for transportation.  RN provided education on possible side effects patient may experience.    RN will contact Morey Hummingbird with daily radiation dates once scheduled.

## 2022-08-24 ENCOUNTER — Other Ambulatory Visit (HOSPITAL_COMMUNITY): Payer: Self-pay | Admitting: Urology

## 2022-08-24 DIAGNOSIS — C61 Malignant neoplasm of prostate: Secondary | ICD-10-CM

## 2022-08-26 ENCOUNTER — Encounter (HOSPITAL_BASED_OUTPATIENT_CLINIC_OR_DEPARTMENT_OTHER): Payer: Self-pay | Admitting: Urology

## 2022-08-26 NOTE — Progress Notes (Signed)
Spoke w/ via phone for pre-op interview---inmate with [place]  Lab needs dos----  EKG, Istat             Lab results------ COVID test -----patient states asymptomatic no test needed Arrive at ------- 0530 on 09/07/22 NPO after MN NO Solid Food.  Clear liquids from MN until--- 0430 on 09/07/22 Med rec completed Medications to take morning of surgery ----- albuterol inhaler prn Diabetic medication ----- Patient instructed no nail polish to be worn day of surgery Patient instructed to bring photo id and insurance card day of surgery Patient aware to have Driver (ride ) / caregiver    for 24 hours after surgery -inmate with [facility] Patient Special Instructions ----- MD order for fleets enema night prior to surgery, administer between 1800-2200 Pre-Op special Istructions -----bring albuterol inhaler DOS Patient verbalized understanding of instructions that were given at this phone interview. Patient denies shortness of breath, chest pain, fever, cough at this phone interview.  Lyndel Pleasure, RN

## 2022-09-06 ENCOUNTER — Encounter (HOSPITAL_BASED_OUTPATIENT_CLINIC_OR_DEPARTMENT_OTHER): Payer: Self-pay | Admitting: Urology

## 2022-09-06 NOTE — Anesthesia Preprocedure Evaluation (Signed)
Anesthesia Evaluation  Patient identified by MRN, date of birth, ID band Patient awake    Reviewed: Allergy & Precautions, NPO status , Patient's Chart, lab work & pertinent test results  Airway Mallampati: III       Dental  (+) Partial Upper, Partial Lower, Missing   Pulmonary asthma , former smoker   breath sounds clear to auscultation       Cardiovascular hypertension, Pt. on medications Normal cardiovascular exam Rhythm:Regular Rate:Normal     Neuro/Psych negative neurological ROS  negative psych ROS   GI/Hepatic negative GI ROS,,,(+) Hepatitis -, C  Endo/Other  negative endocrine ROS    Renal/GU negative Renal ROS   Prostate Ca    Musculoskeletal negative musculoskeletal ROS (+)    Abdominal   Peds  Hematology negative hematology ROS (+)   Anesthesia Other Findings   Reproductive/Obstetrics Hx/o syphilis                              Anesthesia Physical Anesthesia Plan  ASA: 2  Anesthesia Plan: MAC   Post-op Pain Management: Minimal or no pain anticipated   Induction: Intravenous  PONV Risk Score and Plan: 3 and Treatment may vary due to age or medical condition, Midazolam, Ondansetron and Dexamethasone  Airway Management Planned: Natural Airway and Simple Face Mask  Additional Equipment: None  Intra-op Plan:   Post-operative Plan:   Informed Consent: I have reviewed the patients History and Physical, chart, labs and discussed the procedure including the risks, benefits and alternatives for the proposed anesthesia with the patient or authorized representative who has indicated his/her understanding and acceptance.     Dental advisory given  Plan Discussed with: CRNA and Anesthesiologist  Anesthesia Plan Comments:          Anesthesia Quick Evaluation

## 2022-09-07 ENCOUNTER — Ambulatory Visit (HOSPITAL_COMMUNITY): Admission: RE | Admit: 2022-09-07 | Source: Ambulatory Visit

## 2022-09-07 ENCOUNTER — Ambulatory Visit (HOSPITAL_BASED_OUTPATIENT_CLINIC_OR_DEPARTMENT_OTHER): Admitting: Anesthesiology

## 2022-09-07 ENCOUNTER — Encounter (HOSPITAL_BASED_OUTPATIENT_CLINIC_OR_DEPARTMENT_OTHER): Admission: RE | Payer: Self-pay | Source: Home / Self Care | Attending: Urology

## 2022-09-07 ENCOUNTER — Ambulatory Visit (HOSPITAL_BASED_OUTPATIENT_CLINIC_OR_DEPARTMENT_OTHER): Admission: RE | Admit: 2022-09-07 | Discharge: 2022-09-07 | Attending: Urology | Admitting: Urology

## 2022-09-07 ENCOUNTER — Emergency Department (HOSPITAL_COMMUNITY)
Admission: EM | Admit: 2022-09-07 | Discharge: 2022-09-08 | Disposition: A | Discharge status: Home / Self Care | Source: Home / Self Care | Attending: Emergency Medicine | Admitting: Emergency Medicine

## 2022-09-07 ENCOUNTER — Other Ambulatory Visit: Payer: Self-pay

## 2022-09-07 ENCOUNTER — Encounter (HOSPITAL_BASED_OUTPATIENT_CLINIC_OR_DEPARTMENT_OTHER): Payer: Self-pay | Admitting: Urology

## 2022-09-07 ENCOUNTER — Encounter (HOSPITAL_COMMUNITY): Payer: Self-pay

## 2022-09-07 DIAGNOSIS — Z79899 Other long term (current) drug therapy: Secondary | ICD-10-CM | POA: Insufficient documentation

## 2022-09-07 DIAGNOSIS — Z87891 Personal history of nicotine dependence: Secondary | ICD-10-CM | POA: Insufficient documentation

## 2022-09-07 DIAGNOSIS — Z8619 Personal history of other infectious and parasitic diseases: Secondary | ICD-10-CM | POA: Insufficient documentation

## 2022-09-07 DIAGNOSIS — I10 Essential (primary) hypertension: Secondary | ICD-10-CM | POA: Insufficient documentation

## 2022-09-07 DIAGNOSIS — J45909 Unspecified asthma, uncomplicated: Secondary | ICD-10-CM

## 2022-09-07 DIAGNOSIS — C61 Malignant neoplasm of prostate: Secondary | ICD-10-CM | POA: Diagnosis present

## 2022-09-07 DIAGNOSIS — Z8546 Personal history of malignant neoplasm of prostate: Secondary | ICD-10-CM | POA: Insufficient documentation

## 2022-09-07 DIAGNOSIS — R339 Retention of urine, unspecified: Secondary | ICD-10-CM | POA: Insufficient documentation

## 2022-09-07 HISTORY — PX: GOLD SEED IMPLANT: SHX6343

## 2022-09-07 HISTORY — DX: Syphilis, unspecified: A53.9

## 2022-09-07 HISTORY — DX: Presence of dental prosthetic device (complete) (partial): Z97.2

## 2022-09-07 HISTORY — DX: Unspecified viral hepatitis C without hepatic coma: B19.20

## 2022-09-07 HISTORY — PX: SPACE OAR INSTILLATION: SHX6769

## 2022-09-07 LAB — POCT I-STAT, CHEM 8
BUN: 21 mg/dL (ref 8–23)
Calcium, Ion: 1.34 mmol/L (ref 1.15–1.40)
Chloride: 103 mmol/L (ref 98–111)
Creatinine, Ser: 1.4 mg/dL — ABNORMAL HIGH (ref 0.61–1.24)
Glucose, Bld: 78 mg/dL (ref 70–99)
HCT: 36 % — ABNORMAL LOW (ref 39.0–52.0)
Hemoglobin: 12.2 g/dL — ABNORMAL LOW (ref 13.0–17.0)
Potassium: 4.3 mmol/L (ref 3.5–5.1)
Sodium: 140 mmol/L (ref 135–145)
TCO2: 28 mmol/L (ref 22–32)

## 2022-09-07 SURGERY — INSERTION, GOLD SEEDS
Anesthesia: Monitor Anesthesia Care | Site: Prostate

## 2022-09-07 MED ORDER — FENTANYL CITRATE (PF) 100 MCG/2ML IJ SOLN
INTRAMUSCULAR | Status: AC
  Filled 2022-09-07: qty 2

## 2022-09-07 MED ORDER — FENTANYL CITRATE (PF) 100 MCG/2ML IJ SOLN
25.0000 ug | INTRAMUSCULAR | Status: DC | PRN
  Administered 2022-09-07: 25 ug via INTRAVENOUS
  Administered 2022-09-07: 50 ug via INTRAVENOUS

## 2022-09-07 MED ORDER — MIDAZOLAM HCL 5 MG/5ML IJ SOLN
INTRAMUSCULAR | Status: DC | PRN
  Administered 2022-09-07: 2 mg via INTRAVENOUS

## 2022-09-07 MED ORDER — DEXMEDETOMIDINE HCL IN NACL 200 MCG/50ML IV SOLN
INTRAVENOUS | Status: DC | PRN
  Administered 2022-09-07: 8 ug via INTRAVENOUS

## 2022-09-07 MED ORDER — PHENYLEPHRINE 80 MCG/ML (10ML) SYRINGE FOR IV PUSH (FOR BLOOD PRESSURE SUPPORT)
PREFILLED_SYRINGE | INTRAVENOUS | Status: DC | PRN
  Administered 2022-09-07: 80 ug via INTRAVENOUS
  Administered 2022-09-07: 160 ug via INTRAVENOUS
  Administered 2022-09-07 (×2): 80 ug via INTRAVENOUS

## 2022-09-07 MED ORDER — OXYCODONE HCL 5 MG PO TABS: 5.0000 mg | ORAL_TABLET | Freq: Once | ORAL | Status: DC | PRN

## 2022-09-07 MED ORDER — PROPOFOL 500 MG/50ML IV EMUL
INTRAVENOUS | Status: DC | PRN
  Administered 2022-09-07: 200 ug/kg/min via INTRAVENOUS

## 2022-09-07 MED ORDER — PROPOFOL 500 MG/50ML IV EMUL
INTRAVENOUS | Status: AC
  Filled 2022-09-07: qty 50

## 2022-09-07 MED ORDER — MIDAZOLAM HCL 2 MG/2ML IJ SOLN
INTRAMUSCULAR | Status: AC
  Filled 2022-09-07: qty 2

## 2022-09-07 MED ORDER — PHENYLEPHRINE 80 MCG/ML (10ML) SYRINGE FOR IV PUSH (FOR BLOOD PRESSURE SUPPORT)
PREFILLED_SYRINGE | INTRAVENOUS | Status: AC
  Filled 2022-09-07: qty 10

## 2022-09-07 MED ORDER — PROPOFOL 10 MG/ML IV BOLUS
INTRAVENOUS | Status: DC | PRN
  Administered 2022-09-07: 20 mg via INTRAVENOUS
  Administered 2022-09-07 (×2): 10 mg via INTRAVENOUS

## 2022-09-07 MED ORDER — GLYCOPYRROLATE PF 0.2 MG/ML IJ SOSY
PREFILLED_SYRINGE | INTRAMUSCULAR | Status: AC
  Filled 2022-09-07: qty 1

## 2022-09-07 MED ORDER — ONDANSETRON HCL 4 MG/2ML IJ SOLN
INTRAMUSCULAR | Status: DC | PRN
  Administered 2022-09-07: 4 mg via INTRAVENOUS

## 2022-09-07 MED ORDER — LIDOCAINE HCL (PF) 1 % IJ SOLN
INTRAMUSCULAR | Status: DC | PRN
  Administered 2022-09-07: 10 mL

## 2022-09-07 MED ORDER — PROPOFOL 10 MG/ML IV BOLUS
INTRAVENOUS | Status: AC
  Filled 2022-09-07: qty 20

## 2022-09-07 MED ORDER — GLYCOPYRROLATE PF 0.2 MG/ML IJ SOSY
PREFILLED_SYRINGE | INTRAMUSCULAR | Status: DC | PRN
  Administered 2022-09-07: .1 mg via INTRAVENOUS

## 2022-09-07 MED ORDER — ONDANSETRON HCL 4 MG/2ML IJ SOLN: 4.0000 mg | Freq: Once | INTRAMUSCULAR | Status: DC | PRN

## 2022-09-07 MED ORDER — LACTATED RINGERS IV SOLN: INTRAVENOUS | Status: DC

## 2022-09-07 MED ORDER — CEFAZOLIN SODIUM-DEXTROSE 2-4 GM/100ML-% IV SOLN
INTRAVENOUS | Status: AC
  Filled 2022-09-07: qty 100

## 2022-09-07 MED ORDER — DEXMEDETOMIDINE HCL IN NACL 80 MCG/20ML IV SOLN
INTRAVENOUS | Status: AC
  Filled 2022-09-07: qty 20

## 2022-09-07 MED ORDER — SODIUM CHLORIDE (PF) 0.9 % IJ SOLN
INTRAMUSCULAR | Status: DC | PRN
  Administered 2022-09-07: 10 mL

## 2022-09-07 MED ORDER — FENTANYL CITRATE (PF) 100 MCG/2ML IJ SOLN
INTRAMUSCULAR | Status: DC | PRN
  Administered 2022-09-07 (×2): 25 ug via INTRAVENOUS

## 2022-09-07 MED ORDER — OXYCODONE HCL 5 MG/5ML PO SOLN: 5.0000 mg | Freq: Once | ORAL | Status: DC | PRN

## 2022-09-07 MED ORDER — PROPOFOL 1000 MG/100ML IV EMUL
INTRAVENOUS | Status: AC
  Filled 2022-09-07: qty 100

## 2022-09-07 SURGICAL SUPPLY — 25 items
BLADE CLIPPER SENSICLIP SURGIC (BLADE) ×1 IMPLANT
CNTNR URN SCR LID CUP LEK RST (MISCELLANEOUS) ×1 IMPLANT
CONT SPEC 4OZ STRL OR WHT (MISCELLANEOUS) ×1
COVER BACK TABLE 60X90IN (DRAPES) ×1 IMPLANT
DRSG TEGADERM 4X4.75 (GAUZE/BANDAGES/DRESSINGS) ×1 IMPLANT
DRSG TEGADERM 8X12 (GAUZE/BANDAGES/DRESSINGS) ×1 IMPLANT
GAUZE SPONGE 4X4 12PLY STRL (GAUZE/BANDAGES/DRESSINGS) ×1 IMPLANT
GLOVE BIO SURGEON STRL SZ7.5 (GLOVE) ×1 IMPLANT
GLOVE ECLIPSE 8.0 STRL XLNG CF (GLOVE) ×1 IMPLANT
IMPL SPACEOAR VUE SYSTEM (Spacer) IMPLANT
IMPLANT SPACEOAR VUE SYSTEM (Spacer) ×1 IMPLANT
KIT TURNOVER CYSTO (KITS) ×1 IMPLANT
MARKER GOLD PRELOAD 1.2X3 (Urological Implant) ×1 IMPLANT
MARKER SKIN DUAL TIP RULER LAB (MISCELLANEOUS) ×1 IMPLANT
NDL SPNL 22GX3.5 QUINCKE BK (NEEDLE) IMPLANT
NEEDLE SPNL 22GX3.5 QUINCKE BK (NEEDLE) ×1 IMPLANT
SEED GOLD PRELOAD 1.2X3 (Urological Implant) ×1 IMPLANT
SHEATH ULTRASOUND LF (SHEATH) IMPLANT
SHEATH ULTRASOUND LTX NONSTRL (SHEATH) IMPLANT
SLEEVE SCD COMPRESS KNEE MED (STOCKING) ×1 IMPLANT
SURGILUBE 2OZ TUBE FLIPTOP (MISCELLANEOUS) ×1 IMPLANT
SYR 10ML LL (SYRINGE) IMPLANT
SYR CONTROL 10ML LL (SYRINGE) ×1 IMPLANT
TOWEL OR 17X24 6PK STRL BLUE (TOWEL DISPOSABLE) ×1 IMPLANT
UNDERPAD 30X36 HEAVY ABSORB (UNDERPADS AND DIAPERS) ×1 IMPLANT

## 2022-09-07 NOTE — Anesthesia Procedure Notes (Addendum)
Procedure Name: MAC Date/Time: 09/07/2022 7:30 AM  Performed by: Bishop Limbo, CRNAPre-anesthesia Checklist: Patient identified, Emergency Drugs available, Suction available and Patient being monitored Patient Re-evaluated:Patient Re-evaluated prior to induction Oxygen Delivery Method: Simple face mask Ventilation: Oral airway inserted - appropriate to patient size Placement Confirmation: positive ETCO2 Dental Injury: Teeth and Oropharynx as per pre-operative assessment

## 2022-09-07 NOTE — Op Note (Signed)
Preoperative diagnosis: Clinically localized adenocarcinoma of the prostate   Postoperative diagnosis: Clinically localized adenocarcinoma of the prostate  Procedure: 1) Placement of fiducial markers into prostate                    2) Insertion of SpaceOAR hydrogel   Surgeon:Luke Minetta Krisher. M.D.  Anesthesia: General  EBL: Minimal  Complications: None  Indication: Shaun Morgan is a 62 y.o. gentleman with clinically localized prostate cancer. After discussing management options for treatment, he elected to proceed with radiotherapy. He presents today for the above procedures. The potential risks, complications, alternative options, and expected recovery course have been discussed in detail with the patient and he has provided informed consent to proceed.  Description of procedure: The patient was administered preoperative antibiotics, placed in the dorsal lithotomy position, and prepped and draped in the usual sterile fashion. Next, transrectal ultrasonography was utilized to visualize the prostate. Three gold fiducial markers were then placed into the prostate via transperineal needles under ultrasound guidance at the left apex, left base, and right mid gland under direct ultrasound guidance. A site in the midline was then selected on the perineum for placement of an 18 g needle with saline. The needle was advanced above the rectum and below Denonvillier's fascia to the mid gland and confirmed to be in the midline on transverse imaging. One cc of saline was injected confirming appropriate expansion of this space. A total of 5 cc of saline was then injected to open the space further bilaterally. The saline syringe was then removed and the SpaceOAR hydrogel was injected with good distribution bilaterally. He tolerated the procedure well and without complications. He was given a voiding trial prior to discharge from the PACU.

## 2022-09-07 NOTE — ED Triage Notes (Signed)
Patient has hx of prostate ca, had seeds placed this am. Unable to urinate since 0730. Abdomen distended.

## 2022-09-07 NOTE — Transfer of Care (Signed)
Immediate Anesthesia Transfer of Care Note  Patient: Shaun Morgan  Procedure(s) Performed: GOLD SEED IMPLANT (Prostate) SPACE OAR INSTILLATION (Prostate)  Patient Location: PACU  Anesthesia Type:MAC  Level of Consciousness: awake, alert , oriented, and patient cooperative  Airway & Oxygen Therapy: Patient Spontanous Breathing and Patient connected to face mask oxygen  Post-op Assessment: Report given to RN and Post -op Vital signs reviewed and stable  Post vital signs: Reviewed and stable  Last Vitals:  Vitals Value Taken Time  BP 95/71 09/07/22 0812  Temp    Pulse 73 09/07/22 0813  Resp 22 09/07/22 0813  SpO2 100 % 09/07/22 0813  Vitals shown include unvalidated device data.  Last Pain:  Vitals:   09/07/22 0609  TempSrc: Oral  PainSc: 0-No pain      Patients Stated Pain Goal: 4 (09/07/22 4327)  Complications: No notable events documented.

## 2022-09-07 NOTE — Anesthesia Postprocedure Evaluation (Signed)
Anesthesia Post Note  Patient: Shaun Morgan  Procedure(s) Performed: GOLD SEED IMPLANT (Prostate) SPACE OAR INSTILLATION (Prostate)     Patient location during evaluation: PACU Anesthesia Type: MAC Level of consciousness: awake and alert Pain management: pain level controlled Vital Signs Assessment: post-procedure vital signs reviewed and stable Respiratory status: spontaneous breathing, nonlabored ventilation and respiratory function stable Cardiovascular status: stable and blood pressure returned to baseline Postop Assessment: no apparent nausea or vomiting Anesthetic complications: no   No notable events documented.  Last Vitals:  Vitals:   09/07/22 0810 09/07/22 0825  BP:    Pulse: 71 65  Resp: 11 14  Temp:    SpO2: 100% 100%    Last Pain:  Vitals:   09/07/22 0609  TempSrc: Oral  PainSc: 0-No pain                 Naidelyn Parrella A.

## 2022-09-07 NOTE — Discharge Instructions (Addendum)
You should avoid strenuous activities today but may resume all normal activities tomorrow.  2.   You can take Tylenol as needed for any pain or discomfort.  3.    Follow up with your radiation oncologist for your simulation appointment as scheduled.  If this is not currently scheduled or you do not know the date/time for that appointment, please contact the radiation oncology office to confirm.  Post Anesthesia Home Care Instructions  Activity: Get plenty of rest for the remainder of the day. A responsible adult should stay with you for 24 hours following the procedure.  For the next 24 hours, DO NOT: -Drive a car -Operate machinery -Drink alcoholic beverages -Take any medication unless instructed by your physician -Make any legal decisions or sign important papers.  Meals: Start with liquid foods such as gelatin or soup. Progress to regular foods as tolerated. Avoid greasy, spicy, heavy foods. If nausea and/or vomiting occur, drink only clear liquids until the nausea and/or vomiting subsides. Call your physician if vomiting continues.  Special Instructions/Symptoms: Your throat may feel dry or sore from the anesthesia or the breathing tube placed in your throat during surgery. If this causes discomfort, gargle with warm salt water. The discomfort should disappear within 24 hours.      

## 2022-09-07 NOTE — H&P (Signed)
H&P  History of Present Illness: Jenny Reeser is a 62 y.o. year old M who presents today for placement of fiducial markers and spaceOAR  No acute complaints  Past Medical History:  Diagnosis Date   Asthma    Hepatitis C    per detention nurse   Hypertension    Syphilis    per detention nurse   Wears partial dentures    upper/lower    Past Surgical History:  Procedure Laterality Date   CARPAL TUNNEL RELEASE      Home Medications:  Current Meds  Medication Sig   bisacodyl (DULCOLAX) 5 MG EC tablet Take 5 mg by mouth daily as needed for moderate constipation.   Calcium Carb-Cholecalciferol (OYSTER SHELL CALCIUM/D3) 500-10 MG-MCG TABS Take 2 tablets by mouth daily.   ibuprofen (ADVIL,MOTRIN) 800 MG tablet Take 1 tablet (800 mg total) by mouth 3 (three) times daily. (Patient taking differently: Take 400 mg by mouth 3 (three) times daily.)   losartan-hydrochlorothiazide (HYZAAR) 100-25 MG tablet Take 1 tablet by mouth daily.   polyethylene glycol (MIRALAX / GLYCOLAX) 17 g packet Take 17 g by mouth daily.   tamsulosin (FLOMAX) 0.4 MG CAPS capsule Take 0.4 mg by mouth in the morning and at bedtime.    Allergies: No Known Allergies  History reviewed. No pertinent family history.  Social History:  reports that he quit smoking about 10 months ago. His smoking use included cigarettes and pipe. He does not have any smokeless tobacco history on file. He reports that he does not drink alcohol and does not use drugs.  ROS: A complete review of systems was performed.  All systems are negative except for pertinent findings as noted.  Physical Exam:  Vital signs in last 24 hours: Temp:  [97.5 F (36.4 C)] 97.5 F (36.4 C) (04/09 0609) Pulse Rate:  [80] 80 (04/09 0609) Resp:  [17] 17 (04/09 0609) BP: (136)/(101) 136/101 (04/09 0609) SpO2:  [97 %] 97 % (04/09 0609) Weight:  [88.6 kg] 88.6 kg (04/09 0609) Constitutional:  Alert and oriented, No acute distress Cardiovascular: Regular  rate and rhythm Respiratory: Normal respiratory effort, Lungs clear bilaterally GI: Abdomen is soft, nontender, nondistended, no abdominal masses Lymphatic: No lymphadenopathy Neurologic: Grossly intact, no focal deficits Psychiatric: Normal mood and affect   Laboratory Data:  Recent Labs    09/07/22 0621  HGB 12.2*  HCT 36.0*    Recent Labs    09/07/22 0621  NA 140  K 4.3  CL 103  GLUCOSE 78  BUN 21  CREATININE 1.40*     Results for orders placed or performed during the hospital encounter of 09/07/22 (from the past 24 hour(s))  I-STAT, chem 8     Status: Abnormal   Collection Time: 09/07/22  6:21 AM  Result Value Ref Range   Sodium 140 135 - 145 mmol/L   Potassium 4.3 3.5 - 5.1 mmol/L   Chloride 103 98 - 111 mmol/L   BUN 21 8 - 23 mg/dL   Creatinine, Ser 4.69 (H) 0.61 - 1.24 mg/dL   Glucose, Bld 78 70 - 99 mg/dL   Calcium, Ion 6.29 5.28 - 1.40 mmol/L   TCO2 28 22 - 32 mmol/L   Hemoglobin 12.2 (L) 13.0 - 17.0 g/dL   HCT 41.3 (L) 24.4 - 01.0 %   No results found for this or any previous visit (from the past 240 hour(s)).  Renal Function: Recent Labs    09/07/22 0621  CREATININE 1.40*   Estimated Creatinine  Clearance: 61.1 mL/min (A) (by C-G formula based on SCr of 1.4 mg/dL (H)).  Radiologic Imaging: No results found.  Assessment:  Esrom Soy is a 62 y.o. year old M with prostate cancer  Plan:  To OR as planned for fiducial markers and spaceOAR. Procedure and risks reviewed (including but not limited to hematuria, infection, malplacement, inability to safely place markers, urinary retention)  Irine Seal, MD 09/07/2022, 7:23 AM  [facility] Pager: [phone]

## 2022-09-08 LAB — URINALYSIS, ROUTINE W REFLEX MICROSCOPIC
Bilirubin Urine: NEGATIVE
Glucose, UA: NEGATIVE mg/dL
Ketones, ur: NEGATIVE mg/dL
Leukocytes,Ua: NEGATIVE
Nitrite: NEGATIVE
Protein, ur: NEGATIVE mg/dL
Specific Gravity, Urine: 1.01 (ref 1.005–1.030)
pH: 6 (ref 5.0–8.0)

## 2022-09-08 NOTE — Discharge Instructions (Addendum)
Use the leg bag during the day, switch to the large bag at night when you are sleeping.  Make a follow-up appointment with your urologist, who will decide when to take the catheter out.

## 2022-09-08 NOTE — ED Provider Notes (Signed)
[facility] EMERGENCY DEPARTMENT AT [facility] Provider Note   CSN: [id number] Arrival date & time: 09/07/22  2305     History  Chief Complaint  Patient presents with   Urinary Retention    Shaun Morgan is a 63 y.o. male.  The history is provided by the patient.  He has history of hypertension, asthma, prostate cancer treated with SpaceOAR gel placed earlier today.  He was able to urinate in the recovery room, but states that he has not been able to urinate since discharge.   Home Medications Prior to Admission medications   Medication Sig Start Date End Date Taking? Authorizing Provider  albuterol (PROVENTIL HFA;VENTOLIN HFA) 108 (90 BASE) MCG/ACT inhaler Inhale 2 puffs into the lungs every 6 (six) hours as needed for wheezing or shortness of breath.    [provider]  bisacodyl (DULCOLAX) 5 MG EC tablet Take 5 mg by mouth daily as needed for moderate constipation.    [provider]  Calcium Carb-Cholecalciferol (OYSTER SHELL CALCIUM/D3) 500-10 MG-MCG TABS Take 2 tablets by mouth daily.    [provider]  ibuprofen (ADVIL,MOTRIN) 800 MG tablet Take 1 tablet (800 mg total) by mouth 3 (three) times daily. Patient taking differently: Take 400 mg by mouth 3 (three) times daily. 04/24/14   Muthersbaugh, Dahlia Client, PA-C  losartan-hydrochlorothiazide (HYZAAR) 100-25 MG tablet Take 1 tablet by mouth daily.    [provider]  polyethylene glycol (MIRALAX / GLYCOLAX) 17 g packet Take 17 g by mouth daily.    [provider]  tamsulosin (FLOMAX) 0.4 MG CAPS capsule Take 0.4 mg by mouth in the morning and at bedtime.    [provider]      Allergies    Patient has no known allergies.    Review of Systems   Review of Systems  All other systems reviewed and are negative.   Physical Exam Updated Vital Signs BP 119/89   Pulse 87   Temp 97.8 F (36.6 C) (Oral)   SpO2 100%  Physical Exam Vitals and nursing note  reviewed.   62 year old male, resting comfortably and in no acute distress. Vital signs are normal. Oxygen saturation is 100%, which is normal. Head is normocephalic and atraumatic. PERRLA, EOMI. Oropharynx is clear. Neck is nontender and supple without adenopathy . Lungs are clear without rales, wheezes, or rhonchi. Chest is nontender. Heart has regular rate and rhythm without murmur. Abdomen is soft, flat, nontender.  Foley catheter had been placed prior to my seeing the patient, bladder is not distended at the time of my exam. Extremities have no cyanosis or edema, full range of motion is present. Skin is warm and dry without rash. Neurologic: Mental status is normal, cranial nerves are intact, moves all extremities equally.  ED Results / Procedures / Treatments   Labs (all labs ordered are listed, but only abnormal results are displayed) Labs Reviewed  URINALYSIS, ROUTINE W REFLEX MICROSCOPIC - Abnormal; Notable for the following components:      Result Value   Hgb urine dipstick SMALL (*)    Bacteria, UA RARE (*)    All other components within normal limits    EKG HUGHSTON, ROSES SU:[id number] 07-Sep-2022 06:36:58 [facility] ROUTINE RECORD July 18, 1960 (62 yr) Male [place] [facility] Loc:513 Technician: [id number] Test ind:pre-op Vent. rate 67 BPM PR interval 164 ms QRS duration 84 ms QT/QTcB 402/424 ms P-R-T axes 43 30 36 Normal sinus rhythm Normal ECG When compared with ECG of 14-Jul-2013  17:04, No significant change was found Confirmed by Dione Booze ([id number]) on 09/08/2022 12:51:57 AM Confirmed By: Dione Booze  Procedures Procedures    Medications Ordered in ED Medications - No data to display  ED Course/ Medical Decision Making/ A&P                             Medical Decision Making  Acute urinary retention postoperatively.  Bladder scan at triage showed 514 mL of urine in the bladder and a Foley catheter was placed draining clear urine.  I  have reviewed and interpreted his electrocardiogram, my interpretation is normal ECG unchanged from prior.  I have reviewed and interpreted his laboratory tests and my interpretation is essentially normal urinalysis.  I am discharging him with Foley catheter in place and he is to follow-up with his urologist.  I have reviewed his past records showing outpatient surgery for placement of SpaceOAR hydrogel in anticipation of external beam radiation for his prostate cancer.  Final Clinical Impression(s) / ED Diagnoses Final diagnoses:  Urinary retention    Rx / DC Orders ED Discharge Orders     None         Dione Booze, MD 09/08/22 [id number]

## 2022-09-09 ENCOUNTER — Telehealth: Payer: Self-pay | Admitting: *Deleted

## 2022-09-09 ENCOUNTER — Encounter (HOSPITAL_BASED_OUTPATIENT_CLINIC_OR_DEPARTMENT_OTHER): Payer: Self-pay | Admitting: Urology

## 2022-09-09 NOTE — Telephone Encounter (Signed)
CALLED PATIENT'S MEDICAL NURSE TO REMIND OF SIM APPT. FOR 4/12- ARRIVAL TIME- 2:45 PM @ [facility], INFORMED PATIENT TO ARRIVE WITH A FULL BLADDER, LVM FOR A RETURN CALL

## 2022-09-10 ENCOUNTER — Other Ambulatory Visit: Payer: Self-pay

## 2022-09-10 ENCOUNTER — Ambulatory Visit
Admission: RE | Admit: 2022-09-10 | Discharge: 2022-09-10 | Disposition: A | Discharge status: Home / Self Care | Source: Ambulatory Visit | Attending: Radiation Oncology | Admitting: Radiation Oncology

## 2022-09-10 DIAGNOSIS — C61 Malignant neoplasm of prostate: Secondary | ICD-10-CM

## 2022-09-10 NOTE — Progress Notes (Signed)
Patient presented today for his CT Simulation.    Patient seen in ED on 4/10 for urinary retention and was discharged with a foley catheter.   RN spoke with patient and provided education on need for rescheduling CT Sim due to retention/cath and he is aware that he has an appointment with Urology on 4/24. Verbalized understanding.  RN spoke with Marcelino Duster, medical nuse at [facility].  Confirmed she is aware of urology follow up on 4/24.    RN will follow up after urology appointment on 4/24.

## 2022-09-23 NOTE — Progress Notes (Signed)
Patient presented to urology 4/24 for voiding trial.  Per urology notes unable to remove cath and do voiding trial.   Patient is now scheduled for voiding trial on 4/30.  RN will follow after appointment on 4/30.

## 2022-09-27 ENCOUNTER — Ambulatory Visit

## 2022-09-28 ENCOUNTER — Ambulatory Visit

## 2022-09-29 ENCOUNTER — Ambulatory Visit

## 2022-09-30 ENCOUNTER — Ambulatory Visit

## 2022-10-01 ENCOUNTER — Ambulatory Visit

## 2022-10-04 ENCOUNTER — Ambulatory Visit

## 2022-10-05 ENCOUNTER — Ambulatory Visit

## 2022-10-06 ENCOUNTER — Ambulatory Visit

## 2022-10-06 DIAGNOSIS — C61 Malignant neoplasm of prostate: Secondary | ICD-10-CM | POA: Insufficient documentation

## 2022-10-07 ENCOUNTER — Ambulatory Visit

## 2022-10-07 NOTE — Progress Notes (Signed)
Patient presented to urology on 5/8 and passed his voiding trial. Patient is now rescheduled for his CT Simulation on 5/16 @ 10am.    RN notified patient's case worker, Dionne, and provided documentation to support his treatment recommendations.    Plan of care in progress.

## 2022-10-08 ENCOUNTER — Ambulatory Visit

## 2022-10-11 ENCOUNTER — Ambulatory Visit

## 2022-10-12 ENCOUNTER — Ambulatory Visit

## 2022-10-13 ENCOUNTER — Ambulatory Visit

## 2022-10-13 NOTE — Progress Notes (Signed)
  Radiation Oncology         [phone] ________________________________  Name: Shaun Morgan MRN: [id number]  Date: 10/14/2022  DOB: 1961/04/19  SIMULATION AND TREATMENT PLANNING NOTE    ICD-10-CM   1. Malignant neoplasm of prostate (HCC)  C61       DIAGNOSIS:  62 y.o. 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   NARRATIVE:  The patient was brought to the CT Simulation planning suite.  Identity was confirmed.  All relevant records and images related to the planned course of therapy were reviewed.  The patient freely provided informed written consent to proceed with treatment after reviewing the details related to the planned course of therapy. The consent form was witnessed and verified by the simulation staff.  Then, the patient was set-up in a stable reproducible supine position for radiation therapy.  A vacuum lock pillow device was custom fabricated to position his legs in a reproducible immobilized position.  Then, I performed a urethrogram under sterile conditions to identify the prostatic apex.  CT images were obtained.  Surface markings were placed.  The CT images were loaded into the planning software.  Then the prostate target and avoidance structures including the rectum, bladder, bowel and hips were contoured.  Treatment planning then occurred.  The radiation prescription was entered and confirmed.  A total of one complex treatment devices was fabricated. I have requested : Intensity Modulated Radiotherapy (IMRT) is medically necessary for this case for the following reason:  Rectal sparing.Marland Kitchen  PLAN:   The prostate, seminal vesicles, and pelvic lymph nodes will initially be treated to 45 Gy in 25 fractions of 1.8 Gy followed by a boost to the prostate only, to 75 Gy with 15 additional fractions of 2.0 Gy; concurrent with ADT which was started 07/21/22.   ________________________________  Artist Pais. Kathrynn Running, M.D.

## 2022-10-14 ENCOUNTER — Ambulatory Visit
Admission: RE | Admit: 2022-10-14 | Discharge: 2022-10-14 | Disposition: A | Discharge status: Home / Self Care | Source: Ambulatory Visit | Attending: Radiation Oncology | Admitting: Radiation Oncology

## 2022-10-14 DIAGNOSIS — C61 Malignant neoplasm of prostate: Secondary | ICD-10-CM

## 2022-10-14 NOTE — Progress Notes (Signed)
RN left message with [facility] for call back if they have any questions or concerns regarding patient's schedule for upcoming daily radiation treatment.   E-mail sent to Albertina Senegal, case worker, with patient's schedule.

## 2022-10-15 ENCOUNTER — Ambulatory Visit

## 2022-10-18 ENCOUNTER — Ambulatory Visit

## 2022-10-19 ENCOUNTER — Ambulatory Visit

## 2022-10-19 DIAGNOSIS — C61 Malignant neoplasm of prostate: Secondary | ICD-10-CM | POA: Diagnosis not present

## 2022-10-20 ENCOUNTER — Ambulatory Visit

## 2022-10-21 ENCOUNTER — Ambulatory Visit

## 2022-10-22 ENCOUNTER — Ambulatory Visit

## 2022-10-25 ENCOUNTER — Ambulatory Visit

## 2022-10-26 ENCOUNTER — Ambulatory Visit

## 2022-10-27 ENCOUNTER — Ambulatory Visit

## 2022-10-28 ENCOUNTER — Ambulatory Visit

## 2022-10-28 ENCOUNTER — Other Ambulatory Visit: Payer: Self-pay

## 2022-10-28 ENCOUNTER — Ambulatory Visit
Admission: RE | Admit: 2022-10-28 | Discharge: 2022-10-28 | Disposition: A | Discharge status: Home / Self Care | Source: Ambulatory Visit | Attending: Radiation Oncology | Admitting: Radiation Oncology

## 2022-10-28 DIAGNOSIS — C61 Malignant neoplasm of prostate: Secondary | ICD-10-CM | POA: Diagnosis not present

## 2022-10-28 LAB — RAD ONC ARIA SESSION SUMMARY
Course Elapsed Days: 0
Plan Fractions Treated to Date: 1
Plan Prescribed Dose Per Fraction: 1.8 Gy
Plan Total Fractions Prescribed: 25
Plan Total Prescribed Dose: 45 Gy
Reference Point Dosage Given to Date: 1.8 Gy
Reference Point Session Dosage Given: 1.8 Gy
Session Number: 1

## 2022-10-29 ENCOUNTER — Other Ambulatory Visit: Payer: Self-pay

## 2022-10-29 ENCOUNTER — Ambulatory Visit

## 2022-10-29 ENCOUNTER — Ambulatory Visit
Admission: RE | Admit: 2022-10-29 | Discharge: 2022-10-29 | Disposition: A | Discharge status: Home / Self Care | Source: Ambulatory Visit | Attending: Radiation Oncology

## 2022-10-29 DIAGNOSIS — C61 Malignant neoplasm of prostate: Secondary | ICD-10-CM | POA: Diagnosis not present

## 2022-10-29 LAB — RAD ONC ARIA SESSION SUMMARY
Course Elapsed Days: 1
Plan Fractions Treated to Date: 2
Plan Prescribed Dose Per Fraction: 1.8 Gy
Plan Total Fractions Prescribed: 25
Plan Total Prescribed Dose: 45 Gy
Reference Point Dosage Given to Date: 3.6 Gy
Reference Point Session Dosage Given: 1.8 Gy
Session Number: 2

## 2022-11-01 ENCOUNTER — Ambulatory Visit
Admission: RE | Admit: 2022-11-01 | Discharge: 2022-11-01 | Disposition: A | Discharge status: Home / Self Care | Source: Ambulatory Visit | Attending: Radiation Oncology | Admitting: Radiation Oncology

## 2022-11-01 ENCOUNTER — Other Ambulatory Visit: Payer: Self-pay

## 2022-11-01 ENCOUNTER — Ambulatory Visit

## 2022-11-01 DIAGNOSIS — C61 Malignant neoplasm of prostate: Secondary | ICD-10-CM | POA: Diagnosis present

## 2022-11-01 LAB — RAD ONC ARIA SESSION SUMMARY
Course Elapsed Days: 4
Plan Fractions Treated to Date: 3
Plan Prescribed Dose Per Fraction: 1.8 Gy
Plan Total Fractions Prescribed: 25
Plan Total Prescribed Dose: 45 Gy
Reference Point Dosage Given to Date: 5.4 Gy
Reference Point Session Dosage Given: 1.8 Gy
Session Number: 3

## 2022-11-02 ENCOUNTER — Other Ambulatory Visit: Payer: Self-pay

## 2022-11-02 ENCOUNTER — Ambulatory Visit

## 2022-11-02 DIAGNOSIS — C61 Malignant neoplasm of prostate: Secondary | ICD-10-CM | POA: Diagnosis not present

## 2022-11-02 LAB — RAD ONC ARIA SESSION SUMMARY
Course Elapsed Days: 5
Plan Fractions Treated to Date: 4
Plan Prescribed Dose Per Fraction: 1.8 Gy
Plan Total Fractions Prescribed: 25
Plan Total Prescribed Dose: 45 Gy
Reference Point Dosage Given to Date: 7.2 Gy
Reference Point Session Dosage Given: 1.8 Gy
Session Number: 4

## 2022-11-03 ENCOUNTER — Ambulatory Visit

## 2022-11-03 ENCOUNTER — Other Ambulatory Visit: Payer: Self-pay

## 2022-11-03 ENCOUNTER — Ambulatory Visit
Admission: RE | Admit: 2022-11-03 | Discharge: 2022-11-03 | Disposition: A | Discharge status: Home / Self Care | Source: Ambulatory Visit | Attending: Radiation Oncology | Admitting: Radiation Oncology

## 2022-11-03 DIAGNOSIS — C61 Malignant neoplasm of prostate: Secondary | ICD-10-CM | POA: Diagnosis not present

## 2022-11-03 LAB — RAD ONC ARIA SESSION SUMMARY
Course Elapsed Days: 6
Plan Fractions Treated to Date: 5
Plan Prescribed Dose Per Fraction: 1.8 Gy
Plan Total Fractions Prescribed: 25
Plan Total Prescribed Dose: 45 Gy
Reference Point Dosage Given to Date: 9 Gy
Reference Point Session Dosage Given: 1.8 Gy
Session Number: 5

## 2022-11-04 ENCOUNTER — Ambulatory Visit

## 2022-11-04 ENCOUNTER — Other Ambulatory Visit: Payer: Self-pay

## 2022-11-04 ENCOUNTER — Ambulatory Visit
Admission: RE | Admit: 2022-11-04 | Discharge: 2022-11-04 | Disposition: A | Discharge status: Home / Self Care | Source: Ambulatory Visit | Attending: Radiation Oncology | Admitting: Radiation Oncology

## 2022-11-04 DIAGNOSIS — C61 Malignant neoplasm of prostate: Secondary | ICD-10-CM | POA: Diagnosis not present

## 2022-11-04 LAB — RAD ONC ARIA SESSION SUMMARY
Course Elapsed Days: 7
Plan Fractions Treated to Date: 6
Plan Prescribed Dose Per Fraction: 1.8 Gy
Plan Total Fractions Prescribed: 25
Plan Total Prescribed Dose: 45 Gy
Reference Point Dosage Given to Date: 10.8 Gy
Reference Point Session Dosage Given: 1.8 Gy
Session Number: 6

## 2022-11-05 ENCOUNTER — Ambulatory Visit
Admission: RE | Admit: 2022-11-05 | Discharge: 2022-11-05 | Disposition: A | Discharge status: Home / Self Care | Source: Ambulatory Visit | Attending: Radiation Oncology

## 2022-11-05 ENCOUNTER — Ambulatory Visit

## 2022-11-05 ENCOUNTER — Other Ambulatory Visit: Payer: Self-pay

## 2022-11-05 DIAGNOSIS — C61 Malignant neoplasm of prostate: Secondary | ICD-10-CM | POA: Diagnosis not present

## 2022-11-05 LAB — RAD ONC ARIA SESSION SUMMARY
Course Elapsed Days: 8
Plan Fractions Treated to Date: 7
Plan Prescribed Dose Per Fraction: 1.8 Gy
Plan Total Fractions Prescribed: 25
Plan Total Prescribed Dose: 45 Gy
Reference Point Dosage Given to Date: 12.6 Gy
Reference Point Session Dosage Given: 1.8 Gy
Session Number: 7

## 2022-11-08 ENCOUNTER — Ambulatory Visit
Admission: RE | Admit: 2022-11-08 | Discharge: 2022-11-08 | Disposition: A | Discharge status: Home / Self Care | Source: Ambulatory Visit | Attending: Radiation Oncology

## 2022-11-08 ENCOUNTER — Other Ambulatory Visit: Payer: Self-pay

## 2022-11-08 ENCOUNTER — Ambulatory Visit

## 2022-11-08 DIAGNOSIS — C61 Malignant neoplasm of prostate: Secondary | ICD-10-CM | POA: Diagnosis not present

## 2022-11-08 LAB — RAD ONC ARIA SESSION SUMMARY
Course Elapsed Days: 11
Plan Fractions Treated to Date: 8
Plan Prescribed Dose Per Fraction: 1.8 Gy
Plan Total Fractions Prescribed: 25
Plan Total Prescribed Dose: 45 Gy
Reference Point Dosage Given to Date: 14.4 Gy
Reference Point Session Dosage Given: 1.8 Gy
Session Number: 8

## 2022-11-09 ENCOUNTER — Ambulatory Visit

## 2022-11-09 ENCOUNTER — Ambulatory Visit
Admission: RE | Admit: 2022-11-09 | Discharge: 2022-11-09 | Disposition: A | Discharge status: Home / Self Care | Source: Ambulatory Visit | Attending: Radiation Oncology | Admitting: Radiation Oncology

## 2022-11-09 ENCOUNTER — Other Ambulatory Visit: Payer: Self-pay

## 2022-11-09 DIAGNOSIS — C61 Malignant neoplasm of prostate: Secondary | ICD-10-CM | POA: Diagnosis not present

## 2022-11-09 LAB — RAD ONC ARIA SESSION SUMMARY
Course Elapsed Days: 12
Plan Fractions Treated to Date: 9
Plan Prescribed Dose Per Fraction: 1.8 Gy
Plan Total Fractions Prescribed: 25
Plan Total Prescribed Dose: 45 Gy
Reference Point Dosage Given to Date: 16.2 Gy
Reference Point Session Dosage Given: 1.8 Gy
Session Number: 9

## 2022-11-10 ENCOUNTER — Other Ambulatory Visit: Payer: Self-pay

## 2022-11-10 ENCOUNTER — Ambulatory Visit

## 2022-11-10 ENCOUNTER — Ambulatory Visit
Admission: RE | Admit: 2022-11-10 | Discharge: 2022-11-10 | Disposition: A | Discharge status: Home / Self Care | Source: Ambulatory Visit | Attending: Radiation Oncology | Admitting: Radiation Oncology

## 2022-11-10 DIAGNOSIS — C61 Malignant neoplasm of prostate: Secondary | ICD-10-CM | POA: Diagnosis not present

## 2022-11-10 LAB — RAD ONC ARIA SESSION SUMMARY
Course Elapsed Days: 13
Plan Fractions Treated to Date: 10
Plan Prescribed Dose Per Fraction: 1.8 Gy
Plan Total Fractions Prescribed: 25
Plan Total Prescribed Dose: 45 Gy
Reference Point Dosage Given to Date: 18 Gy
Reference Point Session Dosage Given: 1.8 Gy
Session Number: 10

## 2022-11-11 ENCOUNTER — Ambulatory Visit
Admission: RE | Admit: 2022-11-11 | Discharge: 2022-11-11 | Disposition: A | Discharge status: Home / Self Care | Source: Ambulatory Visit | Attending: Radiation Oncology | Admitting: Radiation Oncology

## 2022-11-11 ENCOUNTER — Other Ambulatory Visit: Payer: Self-pay

## 2022-11-11 ENCOUNTER — Ambulatory Visit

## 2022-11-11 DIAGNOSIS — C61 Malignant neoplasm of prostate: Secondary | ICD-10-CM | POA: Diagnosis not present

## 2022-11-11 LAB — RAD ONC ARIA SESSION SUMMARY
Course Elapsed Days: 14
Plan Fractions Treated to Date: 11
Plan Prescribed Dose Per Fraction: 1.8 Gy
Plan Total Fractions Prescribed: 25
Plan Total Prescribed Dose: 45 Gy
Reference Point Dosage Given to Date: 19.8 Gy
Reference Point Session Dosage Given: 1.8 Gy
Session Number: 11

## 2022-11-12 ENCOUNTER — Ambulatory Visit

## 2022-11-12 ENCOUNTER — Ambulatory Visit
Admission: RE | Admit: 2022-11-12 | Discharge: 2022-11-12 | Disposition: A | Discharge status: Home / Self Care | Source: Ambulatory Visit | Attending: Radiation Oncology | Admitting: Radiation Oncology

## 2022-11-12 ENCOUNTER — Other Ambulatory Visit: Payer: Self-pay

## 2022-11-12 DIAGNOSIS — C61 Malignant neoplasm of prostate: Secondary | ICD-10-CM | POA: Diagnosis not present

## 2022-11-12 LAB — RAD ONC ARIA SESSION SUMMARY
Course Elapsed Days: 15
Plan Fractions Treated to Date: 12
Plan Prescribed Dose Per Fraction: 1.8 Gy
Plan Total Fractions Prescribed: 25
Plan Total Prescribed Dose: 45 Gy
Reference Point Dosage Given to Date: 21.6 Gy
Reference Point Session Dosage Given: 1.8 Gy
Session Number: 12

## 2022-11-15 ENCOUNTER — Other Ambulatory Visit: Payer: Self-pay

## 2022-11-15 ENCOUNTER — Ambulatory Visit

## 2022-11-15 ENCOUNTER — Ambulatory Visit
Admission: RE | Admit: 2022-11-15 | Discharge: 2022-11-15 | Disposition: A | Discharge status: Home / Self Care | Source: Ambulatory Visit | Attending: Radiation Oncology | Admitting: Radiation Oncology

## 2022-11-15 DIAGNOSIS — C61 Malignant neoplasm of prostate: Secondary | ICD-10-CM | POA: Diagnosis not present

## 2022-11-15 LAB — RAD ONC ARIA SESSION SUMMARY
Course Elapsed Days: 18
Plan Fractions Treated to Date: 13
Plan Prescribed Dose Per Fraction: 1.8 Gy
Plan Total Fractions Prescribed: 25
Plan Total Prescribed Dose: 45 Gy
Reference Point Dosage Given to Date: 23.4 Gy
Reference Point Session Dosage Given: 1.8 Gy
Session Number: 13

## 2022-11-16 ENCOUNTER — Ambulatory Visit
Admission: RE | Admit: 2022-11-16 | Discharge: 2022-11-16 | Disposition: A | Discharge status: Home / Self Care | Source: Ambulatory Visit | Attending: Radiation Oncology

## 2022-11-16 ENCOUNTER — Ambulatory Visit

## 2022-11-16 ENCOUNTER — Other Ambulatory Visit: Payer: Self-pay

## 2022-11-16 DIAGNOSIS — C61 Malignant neoplasm of prostate: Secondary | ICD-10-CM | POA: Diagnosis not present

## 2022-11-16 LAB — RAD ONC ARIA SESSION SUMMARY
Course Elapsed Days: 19
Plan Fractions Treated to Date: 14
Plan Prescribed Dose Per Fraction: 1.8 Gy
Plan Total Fractions Prescribed: 25
Plan Total Prescribed Dose: 45 Gy
Reference Point Dosage Given to Date: 25.2 Gy
Reference Point Session Dosage Given: 1.8 Gy
Session Number: 14

## 2022-11-17 ENCOUNTER — Ambulatory Visit

## 2022-11-17 ENCOUNTER — Other Ambulatory Visit: Payer: Self-pay

## 2022-11-17 ENCOUNTER — Ambulatory Visit
Admission: RE | Admit: 2022-11-17 | Discharge: 2022-11-17 | Disposition: A | Discharge status: Home / Self Care | Source: Ambulatory Visit | Attending: Radiation Oncology | Admitting: Radiation Oncology

## 2022-11-17 DIAGNOSIS — C61 Malignant neoplasm of prostate: Secondary | ICD-10-CM | POA: Diagnosis not present

## 2022-11-17 LAB — RAD ONC ARIA SESSION SUMMARY
Course Elapsed Days: 20
Plan Fractions Treated to Date: 15
Plan Prescribed Dose Per Fraction: 1.8 Gy
Plan Total Fractions Prescribed: 25
Plan Total Prescribed Dose: 45 Gy
Reference Point Dosage Given to Date: 27 Gy
Reference Point Session Dosage Given: 1.8 Gy
Session Number: 15

## 2022-11-18 ENCOUNTER — Other Ambulatory Visit: Payer: Self-pay

## 2022-11-18 ENCOUNTER — Ambulatory Visit

## 2022-11-18 ENCOUNTER — Ambulatory Visit
Admission: RE | Admit: 2022-11-18 | Discharge: 2022-11-18 | Disposition: A | Discharge status: Home / Self Care | Source: Ambulatory Visit | Attending: Radiation Oncology | Admitting: Radiation Oncology

## 2022-11-18 DIAGNOSIS — C61 Malignant neoplasm of prostate: Secondary | ICD-10-CM | POA: Diagnosis not present

## 2022-11-18 LAB — RAD ONC ARIA SESSION SUMMARY
Course Elapsed Days: 21
Plan Fractions Treated to Date: 16
Plan Prescribed Dose Per Fraction: 1.8 Gy
Plan Total Fractions Prescribed: 25
Plan Total Prescribed Dose: 45 Gy
Reference Point Dosage Given to Date: 28.8 Gy
Reference Point Session Dosage Given: 1.8 Gy
Session Number: 16

## 2022-11-19 ENCOUNTER — Ambulatory Visit

## 2022-11-19 ENCOUNTER — Other Ambulatory Visit: Payer: Self-pay

## 2022-11-19 ENCOUNTER — Ambulatory Visit
Admission: RE | Admit: 2022-11-19 | Discharge: 2022-11-19 | Disposition: A | Discharge status: Home / Self Care | Source: Ambulatory Visit | Attending: Radiation Oncology | Admitting: Radiation Oncology

## 2022-11-19 DIAGNOSIS — C61 Malignant neoplasm of prostate: Secondary | ICD-10-CM | POA: Diagnosis not present

## 2022-11-19 LAB — RAD ONC ARIA SESSION SUMMARY
Course Elapsed Days: 22
Plan Fractions Treated to Date: 17
Plan Prescribed Dose Per Fraction: 1.8 Gy
Plan Total Fractions Prescribed: 25
Plan Total Prescribed Dose: 45 Gy
Reference Point Dosage Given to Date: 30.6 Gy
Reference Point Session Dosage Given: 1.8 Gy
Session Number: 17

## 2022-11-22 ENCOUNTER — Ambulatory Visit

## 2022-11-22 ENCOUNTER — Other Ambulatory Visit: Payer: Self-pay

## 2022-11-22 ENCOUNTER — Ambulatory Visit
Admission: RE | Admit: 2022-11-22 | Discharge: 2022-11-22 | Disposition: A | Discharge status: Home / Self Care | Source: Ambulatory Visit | Attending: Radiation Oncology | Admitting: Radiation Oncology

## 2022-11-22 DIAGNOSIS — C61 Malignant neoplasm of prostate: Secondary | ICD-10-CM | POA: Diagnosis not present

## 2022-11-22 LAB — RAD ONC ARIA SESSION SUMMARY
Course Elapsed Days: 25
Plan Fractions Treated to Date: 18
Plan Prescribed Dose Per Fraction: 1.8 Gy
Plan Total Fractions Prescribed: 25
Plan Total Prescribed Dose: 45 Gy
Reference Point Dosage Given to Date: 32.4 Gy
Reference Point Session Dosage Given: 1.8 Gy
Session Number: 18

## 2022-11-23 ENCOUNTER — Ambulatory Visit
Admission: RE | Admit: 2022-11-23 | Discharge: 2022-11-23 | Disposition: A | Discharge status: Home / Self Care | Source: Ambulatory Visit | Attending: Radiation Oncology

## 2022-11-23 ENCOUNTER — Ambulatory Visit

## 2022-11-23 ENCOUNTER — Other Ambulatory Visit: Payer: Self-pay

## 2022-11-23 DIAGNOSIS — C61 Malignant neoplasm of prostate: Secondary | ICD-10-CM | POA: Diagnosis not present

## 2022-11-23 LAB — RAD ONC ARIA SESSION SUMMARY
Course Elapsed Days: 26
Plan Fractions Treated to Date: 19
Plan Prescribed Dose Per Fraction: 1.8 Gy
Plan Total Fractions Prescribed: 25
Plan Total Prescribed Dose: 45 Gy
Reference Point Dosage Given to Date: 34.2 Gy
Reference Point Session Dosage Given: 1.8 Gy
Session Number: 19

## 2022-11-24 ENCOUNTER — Ambulatory Visit

## 2022-11-24 ENCOUNTER — Ambulatory Visit
Admission: RE | Admit: 2022-11-24 | Discharge: 2022-11-24 | Disposition: A | Discharge status: Home / Self Care | Source: Ambulatory Visit | Attending: Radiation Oncology | Admitting: Radiation Oncology

## 2022-11-24 ENCOUNTER — Other Ambulatory Visit: Payer: Self-pay

## 2022-11-24 DIAGNOSIS — C61 Malignant neoplasm of prostate: Secondary | ICD-10-CM | POA: Diagnosis not present

## 2022-11-24 LAB — RAD ONC ARIA SESSION SUMMARY
Course Elapsed Days: 27
Plan Fractions Treated to Date: 20
Plan Prescribed Dose Per Fraction: 1.8 Gy
Plan Total Fractions Prescribed: 25
Plan Total Prescribed Dose: 45 Gy
Reference Point Dosage Given to Date: 36 Gy
Reference Point Session Dosage Given: 1.8 Gy
Session Number: 20

## 2022-11-25 ENCOUNTER — Ambulatory Visit

## 2022-11-25 ENCOUNTER — Other Ambulatory Visit: Payer: Self-pay

## 2022-11-25 ENCOUNTER — Ambulatory Visit
Admission: RE | Admit: 2022-11-25 | Discharge: 2022-11-25 | Disposition: A | Discharge status: Home / Self Care | Source: Ambulatory Visit | Attending: Radiation Oncology | Admitting: Radiation Oncology

## 2022-11-25 DIAGNOSIS — C61 Malignant neoplasm of prostate: Secondary | ICD-10-CM | POA: Diagnosis not present

## 2022-11-25 LAB — RAD ONC ARIA SESSION SUMMARY
Course Elapsed Days: 28
Plan Fractions Treated to Date: 21
Plan Prescribed Dose Per Fraction: 1.8 Gy
Plan Total Fractions Prescribed: 25
Plan Total Prescribed Dose: 45 Gy
Reference Point Dosage Given to Date: 37.8 Gy
Reference Point Session Dosage Given: 1.8 Gy
Session Number: 21

## 2022-11-26 ENCOUNTER — Other Ambulatory Visit: Payer: Self-pay

## 2022-11-26 ENCOUNTER — Ambulatory Visit

## 2022-11-26 ENCOUNTER — Ambulatory Visit
Admission: RE | Admit: 2022-11-26 | Discharge: 2022-11-26 | Disposition: A | Discharge status: Home / Self Care | Source: Ambulatory Visit | Attending: Radiation Oncology | Admitting: Radiation Oncology

## 2022-11-26 DIAGNOSIS — C61 Malignant neoplasm of prostate: Secondary | ICD-10-CM | POA: Diagnosis not present

## 2022-11-26 LAB — RAD ONC ARIA SESSION SUMMARY
Course Elapsed Days: 29
Plan Fractions Treated to Date: 22
Plan Prescribed Dose Per Fraction: 1.8 Gy
Plan Total Fractions Prescribed: 25
Plan Total Prescribed Dose: 45 Gy
Reference Point Dosage Given to Date: 39.6 Gy
Reference Point Session Dosage Given: 1.8 Gy
Session Number: 22

## 2022-11-29 ENCOUNTER — Ambulatory Visit

## 2022-11-29 ENCOUNTER — Ambulatory Visit
Admission: RE | Admit: 2022-11-29 | Discharge: 2022-11-29 | Disposition: A | Discharge status: Home / Self Care | Source: Ambulatory Visit | Attending: Radiation Oncology | Admitting: Radiation Oncology

## 2022-11-29 ENCOUNTER — Other Ambulatory Visit: Payer: Self-pay

## 2022-11-29 DIAGNOSIS — C61 Malignant neoplasm of prostate: Secondary | ICD-10-CM | POA: Diagnosis not present

## 2022-11-29 DIAGNOSIS — Z191 Hormone sensitive malignancy status: Secondary | ICD-10-CM | POA: Diagnosis not present

## 2022-11-29 DIAGNOSIS — Z510 Encounter for antineoplastic radiation therapy: Secondary | ICD-10-CM | POA: Diagnosis not present

## 2022-11-29 LAB — RAD ONC ARIA SESSION SUMMARY
Course Elapsed Days: 32
Plan Fractions Treated to Date: 23
Plan Prescribed Dose Per Fraction: 1.8 Gy
Plan Total Fractions Prescribed: 25
Plan Total Prescribed Dose: 45 Gy
Reference Point Dosage Given to Date: 41.4 Gy
Reference Point Session Dosage Given: 1.8 Gy
Session Number: 23

## 2022-11-30 ENCOUNTER — Ambulatory Visit

## 2022-11-30 ENCOUNTER — Ambulatory Visit
Admission: RE | Admit: 2022-11-30 | Discharge: 2022-11-30 | Disposition: A | Discharge status: Home / Self Care | Source: Ambulatory Visit | Attending: Radiation Oncology | Admitting: Radiation Oncology

## 2022-11-30 ENCOUNTER — Other Ambulatory Visit: Payer: Self-pay

## 2022-11-30 DIAGNOSIS — Z191 Hormone sensitive malignancy status: Secondary | ICD-10-CM | POA: Diagnosis not present

## 2022-11-30 DIAGNOSIS — C61 Malignant neoplasm of prostate: Secondary | ICD-10-CM | POA: Diagnosis not present

## 2022-11-30 DIAGNOSIS — Z510 Encounter for antineoplastic radiation therapy: Secondary | ICD-10-CM | POA: Diagnosis not present

## 2022-11-30 LAB — RAD ONC ARIA SESSION SUMMARY
Course Elapsed Days: 33
Plan Fractions Treated to Date: 24
Plan Prescribed Dose Per Fraction: 1.8 Gy
Plan Total Fractions Prescribed: 25
Plan Total Prescribed Dose: 45 Gy
Reference Point Dosage Given to Date: 43.2 Gy
Reference Point Session Dosage Given: 1.8 Gy
Session Number: 24

## 2022-12-01 ENCOUNTER — Ambulatory Visit
Admission: RE | Admit: 2022-12-01 | Discharge: 2022-12-01 | Disposition: A | Discharge status: Home / Self Care | Source: Ambulatory Visit | Attending: Radiation Oncology | Admitting: Radiation Oncology

## 2022-12-01 ENCOUNTER — Other Ambulatory Visit: Payer: Self-pay

## 2022-12-01 ENCOUNTER — Ambulatory Visit

## 2022-12-01 DIAGNOSIS — Z510 Encounter for antineoplastic radiation therapy: Secondary | ICD-10-CM | POA: Diagnosis not present

## 2022-12-01 DIAGNOSIS — Z191 Hormone sensitive malignancy status: Secondary | ICD-10-CM | POA: Diagnosis not present

## 2022-12-01 DIAGNOSIS — C61 Malignant neoplasm of prostate: Secondary | ICD-10-CM | POA: Diagnosis not present

## 2022-12-01 LAB — RAD ONC ARIA SESSION SUMMARY
Course Elapsed Days: 34
Plan Fractions Treated to Date: 25
Plan Prescribed Dose Per Fraction: 1.8 Gy
Plan Total Fractions Prescribed: 25
Plan Total Prescribed Dose: 45 Gy
Reference Point Dosage Given to Date: 45 Gy
Reference Point Session Dosage Given: 1.8 Gy
Session Number: 25

## 2022-12-02 ENCOUNTER — Ambulatory Visit

## 2022-12-03 ENCOUNTER — Ambulatory Visit

## 2022-12-03 ENCOUNTER — Ambulatory Visit
Admission: RE | Admit: 2022-12-03 | Discharge: 2022-12-03 | Disposition: A | Discharge status: Home / Self Care | Source: Ambulatory Visit | Attending: Radiation Oncology

## 2022-12-03 ENCOUNTER — Other Ambulatory Visit: Payer: Self-pay

## 2022-12-03 DIAGNOSIS — C61 Malignant neoplasm of prostate: Secondary | ICD-10-CM | POA: Diagnosis not present

## 2022-12-03 DIAGNOSIS — Z510 Encounter for antineoplastic radiation therapy: Secondary | ICD-10-CM | POA: Diagnosis not present

## 2022-12-03 DIAGNOSIS — Z191 Hormone sensitive malignancy status: Secondary | ICD-10-CM | POA: Diagnosis not present

## 2022-12-03 LAB — RAD ONC ARIA SESSION SUMMARY
Course Elapsed Days: 36
Plan Fractions Treated to Date: 1
Plan Prescribed Dose Per Fraction: 2 Gy
Plan Total Fractions Prescribed: 15
Plan Total Prescribed Dose: 30 Gy
Reference Point Dosage Given to Date: 2 Gy
Reference Point Session Dosage Given: 2 Gy
Session Number: 26

## 2022-12-06 ENCOUNTER — Ambulatory Visit
Admission: RE | Admit: 2022-12-06 | Discharge: 2022-12-06 | Disposition: A | Discharge status: Home / Self Care | Source: Ambulatory Visit | Attending: Radiation Oncology

## 2022-12-06 ENCOUNTER — Other Ambulatory Visit: Payer: Self-pay

## 2022-12-06 ENCOUNTER — Ambulatory Visit

## 2022-12-06 DIAGNOSIS — Z510 Encounter for antineoplastic radiation therapy: Secondary | ICD-10-CM | POA: Diagnosis not present

## 2022-12-06 DIAGNOSIS — Z191 Hormone sensitive malignancy status: Secondary | ICD-10-CM | POA: Diagnosis not present

## 2022-12-06 DIAGNOSIS — C61 Malignant neoplasm of prostate: Secondary | ICD-10-CM | POA: Diagnosis not present

## 2022-12-06 LAB — RAD ONC ARIA SESSION SUMMARY
Course Elapsed Days: 39
Plan Fractions Treated to Date: 2
Plan Prescribed Dose Per Fraction: 2 Gy
Plan Total Fractions Prescribed: 15
Plan Total Prescribed Dose: 30 Gy
Reference Point Dosage Given to Date: 4 Gy
Reference Point Session Dosage Given: 2 Gy
Session Number: 27

## 2022-12-07 ENCOUNTER — Other Ambulatory Visit: Payer: Self-pay

## 2022-12-07 ENCOUNTER — Ambulatory Visit

## 2022-12-07 ENCOUNTER — Ambulatory Visit
Admission: RE | Admit: 2022-12-07 | Discharge: 2022-12-07 | Disposition: A | Discharge status: Home / Self Care | Source: Ambulatory Visit | Attending: Radiation Oncology

## 2022-12-07 DIAGNOSIS — C61 Malignant neoplasm of prostate: Secondary | ICD-10-CM | POA: Diagnosis not present

## 2022-12-07 DIAGNOSIS — Z191 Hormone sensitive malignancy status: Secondary | ICD-10-CM | POA: Diagnosis not present

## 2022-12-07 DIAGNOSIS — Z510 Encounter for antineoplastic radiation therapy: Secondary | ICD-10-CM | POA: Diagnosis not present

## 2022-12-07 LAB — RAD ONC ARIA SESSION SUMMARY
Course Elapsed Days: 40
Plan Fractions Treated to Date: 3
Plan Prescribed Dose Per Fraction: 2 Gy
Plan Total Fractions Prescribed: 15
Plan Total Prescribed Dose: 30 Gy
Reference Point Dosage Given to Date: 6 Gy
Reference Point Session Dosage Given: 2 Gy
Session Number: 28

## 2022-12-08 ENCOUNTER — Ambulatory Visit
Admission: RE | Admit: 2022-12-08 | Discharge: 2022-12-08 | Disposition: A | Discharge status: Home / Self Care | Source: Ambulatory Visit | Attending: Radiation Oncology | Admitting: Radiation Oncology

## 2022-12-08 ENCOUNTER — Ambulatory Visit

## 2022-12-08 ENCOUNTER — Other Ambulatory Visit: Payer: Self-pay

## 2022-12-08 DIAGNOSIS — Z510 Encounter for antineoplastic radiation therapy: Secondary | ICD-10-CM | POA: Diagnosis not present

## 2022-12-08 DIAGNOSIS — C61 Malignant neoplasm of prostate: Secondary | ICD-10-CM | POA: Diagnosis not present

## 2022-12-08 DIAGNOSIS — Z191 Hormone sensitive malignancy status: Secondary | ICD-10-CM | POA: Diagnosis not present

## 2022-12-08 LAB — RAD ONC ARIA SESSION SUMMARY
Course Elapsed Days: 41
Plan Fractions Treated to Date: 4
Plan Prescribed Dose Per Fraction: 2 Gy
Plan Total Fractions Prescribed: 15
Plan Total Prescribed Dose: 30 Gy
Reference Point Dosage Given to Date: 8 Gy
Reference Point Session Dosage Given: 2 Gy
Session Number: 29

## 2022-12-09 ENCOUNTER — Other Ambulatory Visit: Payer: Self-pay

## 2022-12-09 ENCOUNTER — Ambulatory Visit
Admission: RE | Admit: 2022-12-09 | Discharge: 2022-12-09 | Disposition: A | Discharge status: Home / Self Care | Source: Ambulatory Visit | Attending: Radiation Oncology | Admitting: Radiation Oncology

## 2022-12-09 ENCOUNTER — Ambulatory Visit

## 2022-12-09 DIAGNOSIS — Z191 Hormone sensitive malignancy status: Secondary | ICD-10-CM | POA: Diagnosis not present

## 2022-12-09 DIAGNOSIS — Z510 Encounter for antineoplastic radiation therapy: Secondary | ICD-10-CM | POA: Diagnosis not present

## 2022-12-09 DIAGNOSIS — C61 Malignant neoplasm of prostate: Secondary | ICD-10-CM | POA: Diagnosis not present

## 2022-12-09 LAB — RAD ONC ARIA SESSION SUMMARY
Course Elapsed Days: 42
Plan Fractions Treated to Date: 5
Plan Prescribed Dose Per Fraction: 2 Gy
Plan Total Fractions Prescribed: 15
Plan Total Prescribed Dose: 30 Gy
Reference Point Dosage Given to Date: 10 Gy
Reference Point Session Dosage Given: 2 Gy
Session Number: 30

## 2022-12-10 ENCOUNTER — Emergency Department (HOSPITAL_COMMUNITY): Payer: Self-pay

## 2022-12-10 ENCOUNTER — Ambulatory Visit

## 2022-12-10 ENCOUNTER — Ambulatory Visit
Admission: RE | Admit: 2022-12-10 | Discharge: 2022-12-10 | Disposition: A | Discharge status: Home / Self Care | Source: Ambulatory Visit | Attending: Radiation Oncology

## 2022-12-10 ENCOUNTER — Other Ambulatory Visit: Payer: Self-pay

## 2022-12-10 ENCOUNTER — Emergency Department (HOSPITAL_COMMUNITY)
Admission: EM | Admit: 2022-12-10 | Discharge: 2022-12-10 | Disposition: A | Discharge status: Home / Self Care | Payer: Self-pay | Attending: Emergency Medicine | Admitting: Emergency Medicine

## 2022-12-10 DIAGNOSIS — G8929 Other chronic pain: Secondary | ICD-10-CM | POA: Insufficient documentation

## 2022-12-10 DIAGNOSIS — I10 Essential (primary) hypertension: Secondary | ICD-10-CM | POA: Insufficient documentation

## 2022-12-10 DIAGNOSIS — Z191 Hormone sensitive malignancy status: Secondary | ICD-10-CM | POA: Diagnosis not present

## 2022-12-10 DIAGNOSIS — M25512 Pain in left shoulder: Secondary | ICD-10-CM | POA: Insufficient documentation

## 2022-12-10 DIAGNOSIS — Z8546 Personal history of malignant neoplasm of prostate: Secondary | ICD-10-CM | POA: Insufficient documentation

## 2022-12-10 DIAGNOSIS — Z510 Encounter for antineoplastic radiation therapy: Secondary | ICD-10-CM | POA: Diagnosis not present

## 2022-12-10 DIAGNOSIS — J45909 Unspecified asthma, uncomplicated: Secondary | ICD-10-CM | POA: Insufficient documentation

## 2022-12-10 DIAGNOSIS — Z79899 Other long term (current) drug therapy: Secondary | ICD-10-CM | POA: Insufficient documentation

## 2022-12-10 DIAGNOSIS — R109 Unspecified abdominal pain: Secondary | ICD-10-CM | POA: Insufficient documentation

## 2022-12-10 DIAGNOSIS — C61 Malignant neoplasm of prostate: Secondary | ICD-10-CM | POA: Diagnosis not present

## 2022-12-10 LAB — CBC WITH DIFFERENTIAL/PLATELET
Abs Immature Granulocytes: 0.01 10*3/uL (ref 0.00–0.07)
Basophils Absolute: 0 10*3/uL (ref 0.0–0.1)
Basophils Relative: 1 %
Eosinophils Absolute: 0 10*3/uL (ref 0.0–0.5)
Eosinophils Relative: 1 %
HCT: 30.3 % — ABNORMAL LOW (ref 39.0–52.0)
Hemoglobin: 10.4 g/dL — ABNORMAL LOW (ref 13.0–17.0)
Immature Granulocytes: 0 %
Lymphocytes Relative: 20 %
Lymphs Abs: 0.8 10*3/uL (ref 0.7–4.0)
MCH: 32.5 pg (ref 26.0–34.0)
MCHC: 34.3 g/dL (ref 30.0–36.0)
MCV: 94.7 fL (ref 80.0–100.0)
Monocytes Absolute: 0.5 10*3/uL (ref 0.1–1.0)
Monocytes Relative: 12 %
Neutro Abs: 2.4 10*3/uL (ref 1.7–7.7)
Neutrophils Relative %: 66 %
Platelets: 142 10*3/uL — ABNORMAL LOW (ref 150–400)
RBC: 3.2 MIL/uL — ABNORMAL LOW (ref 4.22–5.81)
RDW: 12.6 % (ref 11.5–15.5)
WBC: 3.7 10*3/uL — ABNORMAL LOW (ref 4.0–10.5)
nRBC: 0 % (ref 0.0–0.2)

## 2022-12-10 LAB — COMPREHENSIVE METABOLIC PANEL
ALT: 98 U/L — ABNORMAL HIGH (ref 0–44)
AST: 63 U/L — ABNORMAL HIGH (ref 15–41)
Albumin: 3.8 g/dL (ref 3.5–5.0)
Alkaline Phosphatase: 60 U/L (ref 38–126)
Anion gap: 9 (ref 5–15)
BUN: 28 mg/dL — ABNORMAL HIGH (ref 8–23)
CO2: 24 mmol/L (ref 22–32)
Calcium: 9.6 mg/dL (ref 8.9–10.3)
Chloride: 100 mmol/L (ref 98–111)
Creatinine, Ser: 1.56 mg/dL — ABNORMAL HIGH (ref 0.61–1.24)
GFR, Estimated: 50 mL/min — ABNORMAL LOW (ref >60)
Glucose, Bld: 92 mg/dL (ref 70–99)
Potassium: 3.9 mmol/L (ref 3.5–5.1)
Sodium: 133 mmol/L — ABNORMAL LOW (ref 135–145)
Total Bilirubin: 0.7 mg/dL (ref 0.3–1.2)
Total Protein: 7.9 g/dL (ref 6.5–8.1)

## 2022-12-10 LAB — URINALYSIS, ROUTINE W REFLEX MICROSCOPIC
Bilirubin Urine: NEGATIVE
Glucose, UA: NEGATIVE mg/dL
Hgb urine dipstick: NEGATIVE
Ketones, ur: NEGATIVE mg/dL
Leukocytes,Ua: NEGATIVE
Nitrite: NEGATIVE
Protein, ur: NEGATIVE mg/dL
Specific Gravity, Urine: 1.011 (ref 1.005–1.030)
pH: 5 (ref 5.0–8.0)

## 2022-12-10 LAB — RAD ONC ARIA SESSION SUMMARY
Course Elapsed Days: 43
Plan Fractions Treated to Date: 6
Plan Prescribed Dose Per Fraction: 2 Gy
Plan Total Fractions Prescribed: 15
Plan Total Prescribed Dose: 30 Gy
Reference Point Dosage Given to Date: 12 Gy
Reference Point Session Dosage Given: 2 Gy
Session Number: 31

## 2022-12-10 NOTE — Progress Notes (Signed)
Meet with patient prior to radiation treatment.  Patient was released from [facility] last night.  Patient is under active treatment with no transportation, housing, or phone.    RN provided patient with 31 day bus pass.  Patient verbalized he will be going to Psychologist, clinical in [place] after treatment today.  RN will communicate with [employer] to see what resources are offered.    RN spoke with Child psychotherapist and obtained some information to provide patient regarding application for Medicaid, and housing.  Pt verbalized he will be able to make it for treatment on Monday, 7/15.  RN will meet with patient to review resources.   Message left with Greig Castilla, Interior and spatial designer of [employer], requesting call back.

## 2022-12-10 NOTE — ED Triage Notes (Signed)
Patient reports intermittent epistaxis , left flank pain for several days and requesting STD test .

## 2022-12-10 NOTE — ED Provider Notes (Signed)
[place] EMERGENCY DEPARTMENT AT [facility] Provider Note  CSN: [id number] Arrival date & time: 12/10/22 0005  Chief Complaint(s) Epistaxis and Flank Pain  HPI Shaun Morgan is a 62 y.o. male with a past medical history listed below who presents to the emergency department after being released from prison for evaluation of intermittent epistaxis that he had while incarcerated.  Patient has not had any epistaxis recently.  He also complains of intermittent left-sided flank pain worse after eating.  Improves with lying down.  Has not had any pain today.  Patient also complaining of left shoulder stiffness since being incarcerated.  He denies any falls or trauma.  Reports that this occurred after he was working out. Medical team at prison told him he had arthritis.  The history is provided by the patient.    Past Medical History Past Medical History:  Diagnosis Date   Asthma    Hepatitis C    per detention nurse   Hypertension    Syphilis    per detention nurse   Wears partial dentures    upper/lower   Patient Active Problem List   Diagnosis Date Noted   Malignant neoplasm of prostate (HCC) 07/12/2022   Home Medication(s) Prior to Admission medications   Medication Sig Start Date End Date Taking? Authorizing Provider  albuterol (PROVENTIL HFA;VENTOLIN HFA) 108 (90 BASE) MCG/ACT inhaler Inhale 2 puffs into the lungs every 6 (six) hours as needed for wheezing or shortness of breath.    [provider]  bisacodyl (DULCOLAX) 5 MG EC tablet Take 5 mg by mouth daily as needed for moderate constipation.    [provider]  Calcium Carb-Cholecalciferol (OYSTER SHELL CALCIUM/D3) 500-10 MG-MCG TABS Take 2 tablets by mouth daily.    [provider]  ibuprofen (ADVIL,MOTRIN) 800 MG tablet Take 1 tablet (800 mg total) by mouth 3 (three) times daily. Patient taking differently: Take 400 mg by mouth 3 (three) times daily. 04/24/14   Muthersbaugh, Dahlia Client,  PA-C  losartan-hydrochlorothiazide (HYZAAR) 100-25 MG tablet Take 1 tablet by mouth daily.    [provider]  polyethylene glycol (MIRALAX / GLYCOLAX) 17 g packet Take 17 g by mouth daily.    [provider]  tamsulosin (FLOMAX) 0.4 MG CAPS capsule Take 0.4 mg by mouth in the morning and at bedtime.    [provider]                                                                                                                                    Allergies Patient has no known allergies.  Review of Systems Review of Systems As noted in HPI  Physical Exam Vital Signs  I have reviewed the triage vital signs BP (!) 129/91 (BP Location: Right Arm)   Pulse (!) 108   Temp 98.8 F (37.1 C)   Resp 18   SpO2 94%   Physical Exam Vitals reviewed.  Constitutional:      General: He is not in acute distress.    Appearance: He is well-developed. He is not diaphoretic.  HENT:     Head: Normocephalic and atraumatic.     Right Ear: External ear normal.     Left Ear: External ear normal.     Nose: Nose normal.     Mouth/Throat:     Mouth: Mucous membranes are moist.  Eyes:     General: No scleral icterus.    Conjunctiva/sclera: Conjunctivae normal.  Neck:     Trachea: Phonation normal.  Cardiovascular:     Rate and Rhythm: Normal rate and regular rhythm.  Pulmonary:     Effort: Pulmonary effort is normal. No respiratory distress.     Breath sounds: No stridor.  Abdominal:     General: There is no distension.     Tenderness: There is no abdominal tenderness. There is no right CVA tenderness or left CVA tenderness.  Musculoskeletal:     Right shoulder: Normal strength. Normal pulse.     Left shoulder: Tenderness present. No swelling or bony tenderness. Decreased range of motion (decrease abduction). Normal strength. Normal pulse.     Cervical back: Normal range of motion.  Neurological:     Mental Status: He is alert and oriented to person, place, and time.   Psychiatric:        Behavior: Behavior normal.     ED Results and Treatments Labs (all labs ordered are listed, but only abnormal results are displayed) Labs Reviewed  CBC WITH DIFFERENTIAL/PLATELET - Abnormal; Notable for the following components:      Result Value   WBC 3.7 (*)    RBC 3.20 (*)    Hemoglobin 10.4 (*)    HCT 30.3 (*)    Platelets 142 (*)    All other components within normal limits  COMPREHENSIVE METABOLIC PANEL - Abnormal; Notable for the following components:   Sodium 133 (*)    BUN 28 (*)    Creatinine, Ser 1.56 (*)    AST 63 (*)    ALT 98 (*)    GFR, Estimated 50 (*)    All other components within normal limits  URINALYSIS, ROUTINE W REFLEX MICROSCOPIC                                                                                                                         EKG  EKG Interpretation Date/Time:    Ventricular Rate:    PR Interval:    QRS Duration:    QT Interval:    QTC Calculation:   R Axis:      Text Interpretation:         Radiology DG Shoulder Left  Result Date: 12/10/2022 CLINICAL DATA:  Left shoulder pain. EXAM: LEFT SHOULDER - 2+ VIEW COMPARISON:  Oct 06, 2019 FINDINGS: There is no evidence of an acute fracture or dislocation. A chronic fracture deformity is seen involving the distal left clavicle. Additional chronic  deformity is seen along the greater tubercle of the left humeral head. Marked severity degenerative changes are seen involving the left acromioclavicular joint and left glenohumeral joint. Soft tissues are unremarkable. IMPRESSION: Chronic and degenerative changes without an acute osseous abnormality. Electronically Signed   By: Aram Candela M.D.   On: 12/10/2022 01:37    Medications Ordered in ED Medications - No data to display Procedures Procedures  (including critical care time) Medical Decision Making / ED Course   Medical Decision Making Amount and/or Complexity of Data Reviewed Labs: ordered.  Decision-making details documented in ED Course. Radiology: ordered and independent interpretation performed. Decision-making details documented in ED Course.    No recent epistaxis and patient is not on blood thinners.  Hemoglobin reassuring.. Left-sided flank pain.  Currently asymptomatic.  Possible GI etiology.  UA without evidence of infection ruling out pyelonephritis.  No hematuria concerning for renal stones.  Doubt serious intra-abdominal inflammatory/infectious process requiring advanced imaging.  CMP without significant electrolyte derangements.  Mild renal insufficiency without evidence of AKI.  Stable LFTs. With left shoulder pain was atraumatic.  X-ray notable for chronic changes. No acute process.    Final Clinical Impression(s) / ED Diagnoses Final diagnoses:  Chronic left shoulder pain   The patient appears reasonably screened and/or stabilized for discharge and I doubt any other medical condition or other [facility] requiring further screening, evaluation, or treatment in the ED at this time. I have discussed the findings, Dx and Tx plan with the patient/family who expressed understanding and agree(s) with the plan. Discharge instructions discussed at length. The patient/family was given strict return precautions who verbalized understanding of the instructions. No further questions at time of discharge.  Disposition: Discharge  Condition: Good  ED Discharge Orders     None        Follow Up: Lavinia Sharps, NP [address] [place] [address] [phone]  Call  to schedule an appointment for close follow up  Terance Hart, MD [address] [place] [address] [phone]  Call  as needed    This chart was dictated using voice recognition software.  Despite best efforts to proofread,  errors can occur which can change the documentation meaning.    Nira Conn, MD 12/10/22 [phone]

## 2022-12-12 DIAGNOSIS — Z760 Encounter for issue of repeat prescription: Secondary | ICD-10-CM | POA: Diagnosis not present

## 2022-12-13 ENCOUNTER — Other Ambulatory Visit: Payer: Self-pay

## 2022-12-13 ENCOUNTER — Ambulatory Visit
Admission: RE | Admit: 2022-12-13 | Discharge: 2022-12-13 | Disposition: A | Discharge status: Home / Self Care | Source: Ambulatory Visit | Attending: Radiation Oncology | Admitting: Radiation Oncology

## 2022-12-13 ENCOUNTER — Ambulatory Visit

## 2022-12-13 ENCOUNTER — Other Ambulatory Visit (HOSPITAL_COMMUNITY): Payer: Self-pay

## 2022-12-13 DIAGNOSIS — C61 Malignant neoplasm of prostate: Secondary | ICD-10-CM | POA: Diagnosis not present

## 2022-12-13 DIAGNOSIS — Z510 Encounter for antineoplastic radiation therapy: Secondary | ICD-10-CM | POA: Diagnosis not present

## 2022-12-13 DIAGNOSIS — Z191 Hormone sensitive malignancy status: Secondary | ICD-10-CM | POA: Diagnosis not present

## 2022-12-13 LAB — RAD ONC ARIA SESSION SUMMARY
Course Elapsed Days: 46
Plan Fractions Treated to Date: 7
Plan Prescribed Dose Per Fraction: 2 Gy
Plan Total Fractions Prescribed: 15
Plan Total Prescribed Dose: 30 Gy
Reference Point Dosage Given to Date: 14 Gy
Reference Point Session Dosage Given: 2 Gy
Session Number: 32

## 2022-12-13 MED ORDER — TAMSULOSIN HCL 0.4 MG PO CAPS
0.4000 mg | ORAL_CAPSULE | Freq: Two times a day (BID) | ORAL | 0 refills | Status: DC
  Filled 2022-12-13: qty 30, 15d supply, fill #0

## 2022-12-13 MED ORDER — LOSARTAN POTASSIUM-HCTZ 100-25 MG PO TABS
ORAL_TABLET | Freq: Every day | ORAL | 0 refills | Status: DC
  Filled 2022-12-13: qty 30, 30d supply, fill #0

## 2022-12-13 NOTE — Progress Notes (Signed)
Patient is currently staying at [facility] in [place].   RN provided additional resources for [employer] and housing.  Pt does now have a cell phone and will use bus pass provided for transportation to complete treatment.    RN spoke with financial advocates to get patient applied for [employer] to provide assistance with medications.  Patient will need a letter of support from [employer].  RN notified patient, he is aware and will provide letter.

## 2022-12-14 ENCOUNTER — Other Ambulatory Visit: Payer: Self-pay

## 2022-12-14 ENCOUNTER — Encounter: Payer: Self-pay | Admitting: Radiation Oncology

## 2022-12-14 ENCOUNTER — Ambulatory Visit

## 2022-12-14 ENCOUNTER — Ambulatory Visit
Admission: RE | Admit: 2022-12-14 | Discharge: 2022-12-14 | Disposition: A | Discharge status: Home / Self Care | Source: Ambulatory Visit | Attending: Radiation Oncology | Admitting: Radiation Oncology

## 2022-12-14 DIAGNOSIS — Z191 Hormone sensitive malignancy status: Secondary | ICD-10-CM | POA: Diagnosis not present

## 2022-12-14 DIAGNOSIS — C61 Malignant neoplasm of prostate: Secondary | ICD-10-CM | POA: Diagnosis not present

## 2022-12-14 DIAGNOSIS — Z510 Encounter for antineoplastic radiation therapy: Secondary | ICD-10-CM | POA: Diagnosis not present

## 2022-12-14 LAB — RAD ONC ARIA SESSION SUMMARY
Course Elapsed Days: 47
Plan Fractions Treated to Date: 8
Plan Prescribed Dose Per Fraction: 2 Gy
Plan Total Fractions Prescribed: 15
Plan Total Prescribed Dose: 30 Gy
Reference Point Dosage Given to Date: 16 Gy
Reference Point Session Dosage Given: 2 Gy
Session Number: 33

## 2022-12-14 NOTE — Progress Notes (Signed)
 Pt is approved for the $1000 [employer] grant.  

## 2022-12-14 NOTE — Progress Notes (Signed)
RN provided letter from patient to financial advocates for application of [employer].    CSW provided additional bus pass for patient due to needing transportation from [place] in addition to [place] due to residing at [facility] in [place].

## 2022-12-14 NOTE — Progress Notes (Signed)
Pt has also been approved for the $200 prostate grant.

## 2022-12-15 ENCOUNTER — Other Ambulatory Visit: Payer: Self-pay

## 2022-12-15 ENCOUNTER — Ambulatory Visit

## 2022-12-15 ENCOUNTER — Ambulatory Visit
Admission: RE | Admit: 2022-12-15 | Discharge: 2022-12-15 | Disposition: A | Discharge status: Home / Self Care | Source: Ambulatory Visit | Attending: Radiation Oncology | Admitting: Radiation Oncology

## 2022-12-15 DIAGNOSIS — C61 Malignant neoplasm of prostate: Secondary | ICD-10-CM | POA: Diagnosis not present

## 2022-12-15 DIAGNOSIS — Z510 Encounter for antineoplastic radiation therapy: Secondary | ICD-10-CM | POA: Diagnosis not present

## 2022-12-15 DIAGNOSIS — Z191 Hormone sensitive malignancy status: Secondary | ICD-10-CM | POA: Diagnosis not present

## 2022-12-15 LAB — RAD ONC ARIA SESSION SUMMARY
Course Elapsed Days: 48
Plan Fractions Treated to Date: 9
Plan Prescribed Dose Per Fraction: 2 Gy
Plan Total Fractions Prescribed: 15
Plan Total Prescribed Dose: 30 Gy
Reference Point Dosage Given to Date: 18 Gy
Reference Point Session Dosage Given: 2 Gy
Session Number: 34

## 2022-12-16 ENCOUNTER — Other Ambulatory Visit: Payer: Self-pay

## 2022-12-16 ENCOUNTER — Ambulatory Visit
Admission: RE | Admit: 2022-12-16 | Discharge: 2022-12-16 | Disposition: A | Discharge status: Home / Self Care | Source: Ambulatory Visit | Attending: Radiation Oncology | Admitting: Radiation Oncology

## 2022-12-16 ENCOUNTER — Ambulatory Visit

## 2022-12-16 DIAGNOSIS — Z191 Hormone sensitive malignancy status: Secondary | ICD-10-CM | POA: Diagnosis not present

## 2022-12-16 DIAGNOSIS — C61 Malignant neoplasm of prostate: Secondary | ICD-10-CM | POA: Diagnosis not present

## 2022-12-16 DIAGNOSIS — Z510 Encounter for antineoplastic radiation therapy: Secondary | ICD-10-CM | POA: Diagnosis not present

## 2022-12-16 LAB — RAD ONC ARIA SESSION SUMMARY
Course Elapsed Days: 49
Plan Fractions Treated to Date: 10
Plan Prescribed Dose Per Fraction: 2 Gy
Plan Total Fractions Prescribed: 15
Plan Total Prescribed Dose: 30 Gy
Reference Point Dosage Given to Date: 20 Gy
Reference Point Session Dosage Given: 2 Gy
Session Number: 35

## 2022-12-16 LAB — AMB RESULTS CONSOLE CBG: Glucose: 114

## 2022-12-17 ENCOUNTER — Ambulatory Visit

## 2022-12-20 ENCOUNTER — Other Ambulatory Visit: Payer: Self-pay

## 2022-12-20 ENCOUNTER — Ambulatory Visit
Admission: RE | Admit: 2022-12-20 | Discharge: 2022-12-20 | Disposition: A | Discharge status: Home / Self Care | Source: Ambulatory Visit | Attending: Radiation Oncology | Admitting: Radiation Oncology

## 2022-12-20 DIAGNOSIS — C61 Malignant neoplasm of prostate: Secondary | ICD-10-CM | POA: Diagnosis not present

## 2022-12-20 DIAGNOSIS — Z510 Encounter for antineoplastic radiation therapy: Secondary | ICD-10-CM | POA: Diagnosis not present

## 2022-12-20 DIAGNOSIS — Z191 Hormone sensitive malignancy status: Secondary | ICD-10-CM | POA: Diagnosis not present

## 2022-12-20 LAB — RAD ONC ARIA SESSION SUMMARY
Course Elapsed Days: 53
Plan Fractions Treated to Date: 11
Plan Prescribed Dose Per Fraction: 2 Gy
Plan Total Fractions Prescribed: 15
Plan Total Prescribed Dose: 30 Gy
Reference Point Dosage Given to Date: 22 Gy
Reference Point Session Dosage Given: 2 Gy
Session Number: 36

## 2022-12-21 ENCOUNTER — Ambulatory Visit
Admission: RE | Admit: 2022-12-21 | Discharge: 2022-12-21 | Discharge status: Home / Self Care | Source: Ambulatory Visit | Attending: Radiation Oncology

## 2022-12-21 ENCOUNTER — Other Ambulatory Visit: Payer: Self-pay

## 2022-12-21 DIAGNOSIS — M79604 Pain in right leg: Secondary | ICD-10-CM | POA: Diagnosis not present

## 2022-12-21 DIAGNOSIS — R0789 Other chest pain: Secondary | ICD-10-CM | POA: Diagnosis not present

## 2022-12-21 DIAGNOSIS — Z510 Encounter for antineoplastic radiation therapy: Secondary | ICD-10-CM | POA: Diagnosis not present

## 2022-12-21 DIAGNOSIS — Z191 Hormone sensitive malignancy status: Secondary | ICD-10-CM | POA: Diagnosis not present

## 2022-12-21 DIAGNOSIS — M79605 Pain in left leg: Secondary | ICD-10-CM | POA: Diagnosis not present

## 2022-12-21 DIAGNOSIS — Z5321 Procedure and treatment not carried out due to patient leaving prior to being seen by health care provider: Secondary | ICD-10-CM | POA: Diagnosis not present

## 2022-12-21 DIAGNOSIS — M7989 Other specified soft tissue disorders: Secondary | ICD-10-CM | POA: Diagnosis not present

## 2022-12-21 DIAGNOSIS — C61 Malignant neoplasm of prostate: Secondary | ICD-10-CM | POA: Diagnosis not present

## 2022-12-21 LAB — RAD ONC ARIA SESSION SUMMARY
Course Elapsed Days: 54
Plan Fractions Treated to Date: 12
Plan Prescribed Dose Per Fraction: 2 Gy
Plan Total Fractions Prescribed: 15
Plan Total Prescribed Dose: 30 Gy
Reference Point Dosage Given to Date: 24 Gy
Reference Point Session Dosage Given: 2 Gy
Session Number: 37

## 2022-12-22 ENCOUNTER — Other Ambulatory Visit: Payer: Self-pay

## 2022-12-22 ENCOUNTER — Ambulatory Visit
Admission: RE | Admit: 2022-12-22 | Discharge: 2022-12-22 | Disposition: A | Discharge status: Home / Self Care | Source: Ambulatory Visit | Attending: Radiation Oncology | Admitting: Radiation Oncology

## 2022-12-22 ENCOUNTER — Other Ambulatory Visit (HOSPITAL_COMMUNITY): Payer: Self-pay

## 2022-12-22 DIAGNOSIS — Z191 Hormone sensitive malignancy status: Secondary | ICD-10-CM | POA: Diagnosis not present

## 2022-12-22 DIAGNOSIS — R079 Chest pain, unspecified: Secondary | ICD-10-CM | POA: Diagnosis not present

## 2022-12-22 DIAGNOSIS — C61 Malignant neoplasm of prostate: Secondary | ICD-10-CM | POA: Diagnosis not present

## 2022-12-22 DIAGNOSIS — Z510 Encounter for antineoplastic radiation therapy: Secondary | ICD-10-CM | POA: Diagnosis not present

## 2022-12-22 LAB — RAD ONC ARIA SESSION SUMMARY
Course Elapsed Days: 55
Plan Fractions Treated to Date: 13
Plan Prescribed Dose Per Fraction: 2 Gy
Plan Total Fractions Prescribed: 15
Plan Total Prescribed Dose: 30 Gy
Reference Point Dosage Given to Date: 26 Gy
Reference Point Session Dosage Given: 2 Gy
Session Number: 38

## 2022-12-23 ENCOUNTER — Inpatient Hospital Stay: Admitting: Licensed Clinical Social Worker

## 2022-12-23 ENCOUNTER — Ambulatory Visit
Admission: RE | Admit: 2022-12-23 | Discharge: 2022-12-23 | Disposition: A | Discharge status: Home / Self Care | Source: Ambulatory Visit | Attending: Radiation Oncology | Admitting: Radiation Oncology

## 2022-12-23 ENCOUNTER — Other Ambulatory Visit: Payer: Self-pay

## 2022-12-23 ENCOUNTER — Ambulatory Visit

## 2022-12-23 DIAGNOSIS — Z191 Hormone sensitive malignancy status: Secondary | ICD-10-CM | POA: Diagnosis not present

## 2022-12-23 DIAGNOSIS — Z510 Encounter for antineoplastic radiation therapy: Secondary | ICD-10-CM | POA: Diagnosis not present

## 2022-12-23 DIAGNOSIS — C61 Malignant neoplasm of prostate: Secondary | ICD-10-CM | POA: Diagnosis not present

## 2022-12-23 LAB — RAD ONC ARIA SESSION SUMMARY
Course Elapsed Days: 56
Plan Fractions Treated to Date: 14
Plan Prescribed Dose Per Fraction: 2 Gy
Plan Total Fractions Prescribed: 15
Plan Total Prescribed Dose: 30 Gy
Reference Point Dosage Given to Date: 28 Gy
Reference Point Session Dosage Given: 2 Gy
Session Number: 39

## 2022-12-23 NOTE — Progress Notes (Signed)
RN contacted [facility] to schedule patient for ADT injection, and 3 month post treatment follow up.   RN was transferred to Loney Laurence to review for scheduling.  Voicemail left for call back.

## 2022-12-23 NOTE — Progress Notes (Signed)
[facility] Clinical Social Work  Initial Assessment   Shaun Morgan is a 62 y.o. year old male presenting alone. Clinical Social Work was referred by nurse navigator for assessment of psychosocial needs.   SDOH (Social Determinants of Health) assessments performed: No SDOH Interventions    Flowsheet Row Community Documentation from 12/16/2022 in [facility]  SDOH Interventions   Food Insecurity Interventions Intervention Not Indicated  Housing Interventions Other (Comment)  Transportation Interventions Other (Comment)  Utilities Interventions Intervention Not Indicated       SDOH Screenings   Food Insecurity: No Food Insecurity (12/16/2022)  Housing: Medium Risk (12/16/2022)  Transportation Needs: Unmet Transportation Needs (12/16/2022)  Utilities: Not At Risk (12/16/2022)  Financial Resource Strain: Medium Risk (07/18/2018)   Received from [facility] visits prior to 07/31/2022.  Tobacco Use: Medium Risk (09/07/2022)     Distress Screen completed: No    07/13/2022    9:00 AM  ONCBCN DISTRESS SCREENING  Screening Type Initial Screening  Distress experienced in past week (1-10) 4  Emotional problem type Nervousness/Anxiety  Physical Problem type Pain;Changes in urination;Constipation/diarrhea;Tingling hands/feet;Swollen arms/legs      Family/Social Information:  Housing Arrangement: Patient is currently housed at [facility].  Family members/support persons in your life? Patient denied any current support persons.  Transportation concerns: yes, patient [place] relies on [place] with [place]. Patient may have challenges accessing resources if he does not have funds for [place] transportation or if there is not an accessible bus line by agencies.   Employment: Unemployed Patient recently returned to the community from being incarcerated for over a year. Patient hopes to get approved for SSDI.  Income source: No  income Financial concerns: Yes, current concerns Type of concern: Rent/ mortgage, Phone, Transportation, and Food Food access concerns: yes. Patient is currently waiting for [facility] Benefit Card. Patient is not permitted to bring food items into the shelter, therefor has to consume items or hide them near by. Religious or spiritual practice: Not known Services Currently in place:  Patient is currently working with legal aide to obtain items that he had prior to being incarcerated.  Coping/ Adjustment to diagnosis: Patient understands treatment plan and what happens next? yes Concerns about diagnosis and/or treatment: I'm not especially worried about anything Patient reported stressors: Housing, Actuary, English as a second language teacher, Arts administrator, Children, and Depression Hopes and/or priorities: Patient hopes to regain pre-incarceration functioning.  Current coping skills/ strengths: Ability for insight , Average or above average intelligence , Capable of independent living , Communication skills , and General fund of knowledge     SUMMARY: Current SDOH Barriers:  Financial constraints related to re-entering the community, Limited social support, English as a second language teacher, Housing barriers, Mental Health Concerns , and Social Isolation  Clinical Social Work Clinical Goal(s):  Explore community resource options for unmet needs related to:  Housing  and Transportation  Interventions: Discussed common feeling and emotions when being diagnosed with cancer, and the importance of support during treatment Informed patient of the support team roles and support services at [facility] Provided CSW contact information and encouraged patient to call with any questions or concerns Referred patient to [employer], Provided patient with information about [employer] Low income housing, and provided a food pantry bag.   Follow Up Plan: Patient will come to support center after radiation. Patient verbalizes understanding of  plan: Yes    Marguerita Merles, LCSWA Clinical Social Worker [facility]

## 2022-12-24 ENCOUNTER — Other Ambulatory Visit: Payer: Self-pay

## 2022-12-24 ENCOUNTER — Ambulatory Visit
Admission: RE | Admit: 2022-12-24 | Discharge: 2022-12-24 | Disposition: A | Discharge status: Home / Self Care | Source: Ambulatory Visit | Attending: Radiation Oncology | Admitting: Radiation Oncology

## 2022-12-24 ENCOUNTER — Inpatient Hospital Stay: Admitting: Licensed Clinical Social Worker

## 2022-12-24 ENCOUNTER — Ambulatory Visit
Admission: RE | Admit: 2022-12-24 | Discharge: 2022-12-24 | Disposition: A | Discharge status: Home / Self Care | Source: Ambulatory Visit | Attending: Radiation Oncology

## 2022-12-24 ENCOUNTER — Other Ambulatory Visit (HOSPITAL_COMMUNITY): Payer: Self-pay

## 2022-12-24 ENCOUNTER — Encounter: Payer: Self-pay | Admitting: Urology

## 2022-12-24 DIAGNOSIS — Z510 Encounter for antineoplastic radiation therapy: Secondary | ICD-10-CM | POA: Diagnosis not present

## 2022-12-24 DIAGNOSIS — Z191 Hormone sensitive malignancy status: Secondary | ICD-10-CM | POA: Diagnosis not present

## 2022-12-24 DIAGNOSIS — C61 Malignant neoplasm of prostate: Secondary | ICD-10-CM | POA: Diagnosis not present

## 2022-12-24 LAB — RAD ONC ARIA SESSION SUMMARY
Course Elapsed Days: 57
Plan Fractions Treated to Date: 15
Plan Prescribed Dose Per Fraction: 2 Gy
Plan Total Fractions Prescribed: 15
Plan Total Prescribed Dose: 30 Gy
Reference Point Dosage Given to Date: 30 Gy
Reference Point Session Dosage Given: 2 Gy
Session Number: 40

## 2022-12-24 NOTE — Progress Notes (Signed)
[facility] CSW Progress Note  Clinical Child psychotherapist submitted referral for the [facility] FIT program for re-entry.     Marguerita Merles, LCSWA Clinical Social Worker [facility]

## 2022-12-24 NOTE — Progress Notes (Signed)
[facility] Clinical Social Work  Clinical Social Work was referred by Statistician for assessment of psychosocial needs.  Clinical Social Worker met with patient to offer support and assess for needs.  CSW followed up on if client will be able to get to referral agency for clothing. Patient received a bag from food pantry. CSW offered to send referral in for re-entry agencies, client agreed to plan.    Marguerita Merles, LCSWA Clinical Social Worker [facility]

## 2022-12-24 NOTE — Progress Notes (Signed)
RN meet with patient after final radiation treatment.  Pt aware that we are awaiting call back from [facility] regarding follow up for ADT, PSA check and MD follow up.    Pt will also meet with CSW this morning to review additional resources.

## 2022-12-27 ENCOUNTER — Other Ambulatory Visit: Payer: Self-pay | Admitting: Urology

## 2022-12-27 ENCOUNTER — Telehealth: Payer: Self-pay

## 2022-12-27 MED ORDER — CALCIUM CARB-CHOLECALCIFEROL 600-10 MG-MCG PO TABS
1.0000 | ORAL_TABLET | Freq: Two times a day (BID) | ORAL | 2 refills | Status: AC
Start: 2022-12-27 — End: ?
  Filled 2022-12-27 – 2022-12-30 (×2): qty 60, 30d supply, fill #0
  Filled 2023-07-15: qty 60, 30d supply, fill #1
  Filled 2023-08-25: qty 180, 90d supply, fill #2
  Filled 2023-08-29: qty 60, 30d supply, fill #2

## 2022-12-27 MED ORDER — TRIPLE ANTIBIOTIC 5-400-5000 EX OINT
TOPICAL_OINTMENT | Freq: Four times a day (QID) | CUTANEOUS | 0 refills | Status: DC
  Filled 2022-12-27: qty 28.3, fill #0
  Filled 2022-12-30: qty 28.4, 10d supply, fill #0

## 2022-12-27 MED ORDER — HYDROCORTISONE 1 % EX CREA
1.0000 | TOPICAL_CREAM | Freq: Two times a day (BID) | CUTANEOUS | 0 refills | Status: AC
  Filled 2022-12-27: qty 56, 19d supply, fill #0
  Filled 2022-12-30: qty 28, 10d supply, fill #0

## 2022-12-27 MED ORDER — BISMUTH SUBSALICYLATE 262 MG PO CHEW
524.0000 mg | CHEWABLE_TABLET | ORAL | 1 refills | Status: DC | PRN
  Filled 2022-12-27: qty 30, fill #0
  Filled 2022-12-30: qty 30, 15d supply, fill #0

## 2022-12-27 MED ORDER — VITAMIN D3 10 MCG (400 UNIT) PO TABS
400.0000 [IU] | ORAL_TABLET | Freq: Every day | ORAL | 2 refills | Status: DC
  Filled 2022-12-27: qty 150, fill #0
  Filled 2022-12-30: qty 150, 150d supply, fill #0

## 2022-12-27 MED ORDER — PHENAZOPYRIDINE HCL 95 MG PO TABS
95.0000 mg | ORAL_TABLET | Freq: Three times a day (TID) | ORAL | 1 refills | Status: DC | PRN
  Filled 2022-12-27 – 2022-12-30 (×2): qty 30, 10d supply, fill #0

## 2022-12-27 MED ORDER — PREPARATION H 1-0.25-14.4-15 % EX CREA
TOPICAL_CREAM | CUTANEOUS | 1 refills | Status: DC
  Filled 2022-12-27: qty 28.4, fill #0
  Filled 2022-12-30: qty 26, 10d supply, fill #0
  Filled 2023-01-04: qty 26, 30d supply, fill #0

## 2022-12-27 NOTE — Telephone Encounter (Signed)
[facility] CSW Progress Note  Clinical Child psychotherapist received email from re-entry program requesting the area patient would receive services. Clinical Social Worker attempted to reach patient to discuss, patient did not answer, left vm requesting call back  Shaun Morgan, [employer] Clinical Social Worker [facility]

## 2022-12-28 ENCOUNTER — Other Ambulatory Visit (HOSPITAL_COMMUNITY): Payer: Self-pay

## 2022-12-28 NOTE — Progress Notes (Signed)
[facility] CSW Progress Note  Clinical Social Worker  was contacted by E. Wilson of the [employer] with the [employer]  to follow up on referral. CSW was informed that a requirement of the FIT program is to have your PCP moved to their agency, the patient declined, making him ineligible of the re-entry program.   Marguerita Merles, [employer] Clinical Social Worker [facility]

## 2022-12-30 ENCOUNTER — Other Ambulatory Visit (HOSPITAL_COMMUNITY): Payer: Self-pay

## 2022-12-30 NOTE — Progress Notes (Signed)
RN left message with [facility] scheduling requesting call back to review upcoming need for patient to have ADT scheduled.

## 2023-01-03 ENCOUNTER — Encounter: Payer: Self-pay | Admitting: *Deleted

## 2023-01-03 ENCOUNTER — Other Ambulatory Visit (HOSPITAL_COMMUNITY): Payer: Self-pay

## 2023-01-03 NOTE — Progress Notes (Signed)
Pt attended 12/16/22 screening event where his b/p was 126/85 and his blood sugar was 114. During pt's chart review for his follow up, we could see both Nadiyah Abdul LCSW and Deberah Castle, RN as well as Dr. Margaretmary Dys, all from the [facility]  have been working frequently with the pt to get him not only cancer health care and follow up but also SDOH support. On the event registration form he completed, he listed the [facility] as his home address, [address], and listed Lavinia Sharps NP as his PCP. In-basket messages sent today to Ms Glade Lloyd, Nurse Lucious Groves, and NP Placey to offer support in addressing pt's multiple SDOH needs, also being addressed by the Cancer Center support team, over the past several weeks. There are no notes by Chales Abrahams Placey currently visible in [facility],because she does not document in EPIC, to know the last time she has seen the pt or if he has any future appt with her. The Cancer team support notes indicate they are currently working with [facility] team physicians for ongoing post radiation support and f/u.  01/04/23 addended info from listed PCP Lavinia Sharps NP at [facility]: note to both Cancer center LCSW and CM RN: "He can see CM at the [facility] as well." ma

## 2023-01-04 ENCOUNTER — Other Ambulatory Visit (HOSPITAL_COMMUNITY): Payer: Self-pay

## 2023-01-04 NOTE — Progress Notes (Signed)
RN received call from [facility] to follow up with previous request to schedule patient for ADT.   RN spoke with Shaun Morgan and confirmed that patient was no longer incarcerated and utilizes bus transportation for appointments at this time.   Shaun Morgan will have staff get patient scheduled for ADT.  RN will continue to follow to ensure coordination of care.

## 2023-01-07 ENCOUNTER — Emergency Department (HOSPITAL_COMMUNITY)
Admission: EM | Admit: 2023-01-07 | Discharge: 2023-01-07 | Disposition: A | Discharge status: Home / Self Care | Attending: Emergency Medicine | Admitting: Emergency Medicine

## 2023-01-07 ENCOUNTER — Other Ambulatory Visit: Payer: Self-pay

## 2023-01-07 ENCOUNTER — Encounter (HOSPITAL_COMMUNITY): Payer: Self-pay

## 2023-01-07 DIAGNOSIS — K6289 Other specified diseases of anus and rectum: Secondary | ICD-10-CM | POA: Diagnosis not present

## 2023-01-07 DIAGNOSIS — Z8546 Personal history of malignant neoplasm of prostate: Secondary | ICD-10-CM | POA: Insufficient documentation

## 2023-01-07 DIAGNOSIS — D649 Anemia, unspecified: Secondary | ICD-10-CM | POA: Insufficient documentation

## 2023-01-07 HISTORY — DX: Polyneuropathy, unspecified: G62.9

## 2023-01-07 LAB — TYPE AND SCREEN
ABO/RH(D): B POS
Antibody Screen: NEGATIVE

## 2023-01-07 LAB — CBC
HCT: 31.1 % — ABNORMAL LOW (ref 39.0–52.0)
Hemoglobin: 9.8 g/dL — ABNORMAL LOW (ref 13.0–17.0)
MCH: 32.7 pg (ref 26.0–34.0)
MCHC: 31.5 g/dL (ref 30.0–36.0)
MCV: 103.7 fL — ABNORMAL HIGH (ref 80.0–100.0)
Platelets: 141 10*3/uL — ABNORMAL LOW (ref 150–400)
RBC: 3 MIL/uL — ABNORMAL LOW (ref 4.22–5.81)
RDW: 12.8 % (ref 11.5–15.5)
WBC: 4.2 10*3/uL (ref 4.0–10.5)
nRBC: 0 % (ref 0.0–0.2)

## 2023-01-07 LAB — COMPREHENSIVE METABOLIC PANEL
ALT: 71 U/L — ABNORMAL HIGH (ref 0–44)
AST: 52 U/L — ABNORMAL HIGH (ref 15–41)
Albumin: 3.4 g/dL — ABNORMAL LOW (ref 3.5–5.0)
Alkaline Phosphatase: 80 U/L (ref 38–126)
Anion gap: 9 (ref 5–15)
BUN: 21 mg/dL (ref 8–23)
CO2: 23 mmol/L (ref 22–32)
Calcium: 9.2 mg/dL (ref 8.9–10.3)
Chloride: 107 mmol/L (ref 98–111)
Creatinine, Ser: 1.43 mg/dL — ABNORMAL HIGH (ref 0.61–1.24)
GFR, Estimated: 56 mL/min — ABNORMAL LOW (ref >60)
Glucose, Bld: 113 mg/dL — ABNORMAL HIGH (ref 70–99)
Potassium: 4 mmol/L (ref 3.5–5.1)
Sodium: 139 mmol/L (ref 135–145)
Total Bilirubin: 0.5 mg/dL (ref 0.3–1.2)
Total Protein: 7.2 g/dL (ref 6.5–8.1)

## 2023-01-07 LAB — POC OCCULT BLOOD, ED: Fecal Occult Bld: NEGATIVE

## 2023-01-07 MED ORDER — PRAMOXINE HCL (PERIANAL) 1 % EX FOAM: 1.0000 | Freq: Three times a day (TID) | CUTANEOUS | 0 refills | Status: DC | PRN

## 2023-01-07 MED ORDER — POLYETHYLENE GLYCOL 3350 17 G PO PACK: 17.0000 g | PACK | Freq: Every day | ORAL | 0 refills | Status: DC

## 2023-01-07 MED ORDER — LIDOCAINE 5 % EX OINT: 1.0000 | TOPICAL_OINTMENT | CUTANEOUS | 0 refills | Status: AC | PRN

## 2023-01-07 NOTE — ED Triage Notes (Signed)
Pt presents with black stools and diarrhea that started on 8/5. Pt denies N/V or abd pain. Denies anticoagulant use.

## 2023-01-07 NOTE — ED Provider Notes (Signed)
[place] EMERGENCY DEPARTMENT AT [facility] Provider Note   CSN: [id number] Arrival date & time: 01/07/23  1244     History  Chief Complaint  Patient presents with   GI Bleeding    Shaun Morgan is a 62 y.o. male.  HPI   Pt has been having pain in his rectal area.  Sx started a couple of days ago.  He has noticed some intermittent red and then dark color to his stool.  No vomiting or fever. No abd pain.  No history of GI bleed.  NO diarrhea.  Pt has been taking miralax.  Pt does have history of prostate cancer. Finished radiation tx July 25th.  Home Medications Prior to Admission medications   Medication Sig Start Date End Date Taking? Authorizing Provider  albuterol (PROVENTIL HFA;VENTOLIN HFA) 108 (90 BASE) MCG/ACT inhaler Inhale 2 puffs into the lungs every 6 (six) hours as needed for wheezing or shortness of breath.   Yes [provider]  bismuth subsalicylate (PEPTO-BISMOL) 262 MG chewable tablet Chew 2 tablets (524 mg total) by mouth as needed. 12/27/22  Yes Bruning, Ashlyn, PA-C  Calcium Carb-Cholecalciferol (CALCIUM + VITAMIN D3) 600-10 MG-MCG TABS Take 1 tablet by mouth 2 (two) times daily with a meal. 12/27/22  Yes Bruning, Ashlyn, PA-C  gabapentin (NEURONTIN) 300 MG capsule Take 300 mg by mouth 3 (three) times daily.   Yes [provider]  hydrocortisone cream 1 % Apply 1 Application topically 2 (two) times daily. 12/27/22  Yes Bruning, Ashlyn, PA-C  lidocaine (XYLOCAINE) 5 % ointment Apply 1 Application topically as needed. 01/07/23  Yes Linwood Dibbles, MD  losartan-hydrochlorothiazide (HYZAAR) 100-25 MG tablet Take 1 tablet by mouth daily.   Yes [provider]  neomycin-bacitracin-polymyxin (NEOSPORIN) [phone] ointment Apply topically 4 (four) times daily. 12/27/22  Yes Bruning, Ashlyn, PA-C  polyethylene glycol (MIRALAX) 17 g packet Take 17 g by mouth daily. 01/07/23  Yes Linwood Dibbles, MD  Pramox-PE-Glycerin-Petrolatum (PREPARATION H)  1-0.25-14.4-15 % CREA Use as directed 12/27/22  Yes Bruning, Ashlyn, PA-C  pramoxine (PROCTOFOAM) 1 % foam Place 1 Application rectally 3 (three) times daily as needed for anal itching. 01/07/23  Yes Linwood Dibbles, MD  tamsulosin (FLOMAX) 0.4 MG CAPS capsule Take 0.4 mg by mouth in the morning and at bedtime.   Yes [provider]  phenazopyridine (AZO-STANDARD) 95 MG tablet Take 1 tablet (95 mg total) by mouth 3 (three) times daily as needed for pain. Patient not taking: Reported on 01/07/2023 12/27/22   Marcello Fennel, PA-C      Allergies    Acetaminophen    Review of Systems   Review of Systems  Physical Exam Updated Vital Signs BP 109/73 (BP Location: Right Arm)   Pulse (!) 102   Temp 98.8 F (37.1 C) (Oral)   Resp 20   Ht 1.753 m (5\' 9" )   Wt 100.2 kg   SpO2 99%   BMI 32.64 kg/m  Physical Exam Vitals and nursing note reviewed.  Constitutional:      General: He is not in acute distress.    Appearance: He is well-developed.  HENT:     Head: Normocephalic and atraumatic.     Right Ear: External ear normal.     Left Ear: External ear normal.  Eyes:     General: No scleral icterus.       Right eye: No discharge.        Left eye: No discharge.     Conjunctiva/sclera: Conjunctivae normal.  Neck:     Trachea: No tracheal deviation.  Cardiovascular:     Rate and Rhythm: Normal rate and regular rhythm.  Pulmonary:     Effort: Pulmonary effort is normal. No respiratory distress.     Breath sounds: Normal breath sounds. No stridor. No wheezing or rales.  Abdominal:     General: Bowel sounds are normal. There is no distension.     Palpations: Abdomen is soft.     Tenderness: There is no abdominal tenderness. There is no guarding or rebound.  Genitourinary:    Comments: Ttp rectal area, no mass Musculoskeletal:        General: No tenderness or deformity.     Cervical back: Neck supple.  Skin:    General: Skin is warm and dry.     Findings: No rash.  Neurological:      General: No focal deficit present.     Mental Status: He is alert.     Cranial Nerves: No cranial nerve deficit, dysarthria or facial asymmetry.     Sensory: No sensory deficit.     Motor: No abnormal muscle tone or seizure activity.     Coordination: Coordination normal.  Psychiatric:        Mood and Affect: Mood normal.     ED Results / Procedures / Treatments   Labs (all labs ordered are listed, but only abnormal results are displayed) Labs Reviewed  COMPREHENSIVE METABOLIC PANEL - Abnormal; Notable for the following components:      Result Value   Glucose, Bld 113 (*)    Creatinine, Ser 1.43 (*)    Albumin 3.4 (*)    AST 52 (*)    ALT 71 (*)    GFR, Estimated 56 (*)    All other components within normal limits  CBC - Abnormal; Notable for the following components:   RBC 3.00 (*)    Hemoglobin 9.8 (*)    HCT 31.1 (*)    MCV 103.7 (*)    Platelets 141 (*)    All other components within normal limits  POC OCCULT BLOOD, ED  TYPE AND SCREEN  ABO/RH    EKG None  Radiology No results found.  Procedures Procedures    Medications Ordered in ED Medications - No data to display  ED Course/ Medical Decision Making/ A&P Clinical Course as of 01/07/23 1609  Fri Jan 07, 2023  1504 Hemoccult negative for blood [JK]  1504 CBC(!) Hemoglobin decreased compared to previous [JK]    Clinical Course User Index [JK] Linwood Dibbles, MD                                 Medical Decision Making Problems Addressed: Rectal pain: acute illness or injury that poses a threat to life or bodily functions  Amount and/or Complexity of Data Reviewed Labs: ordered. Decision-making details documented in ED Course.  Risk OTC drugs. Prescription drug management.    patient presented to the ED for evaluation of rectal pain.  Patient is also concerned that he was having problems of intestinal bleeding.  ED workup was reassuring.  Hemoglobin slightly decreased compared to previous however  he Hemoccult is negative.  No signs of active bleeding.  Patient does not appear to have any signs of active GI bleed.  He is not having any fevers or chills.  I doubt proctitis.  It is possible he could have an internal hemorrhoid or fissure.  Will prescribe medications for symptomatic relief.  Outpatient follow-up with GI.        Final Clinical Impression(s) / ED Diagnoses Final diagnoses:  Rectal pain    Rx / DC Orders ED Discharge Orders          Ordered    pramoxine (PROCTOFOAM) 1 % foam  3 times daily PRN        01/07/23 1602    lidocaine (XYLOCAINE) 5 % ointment  As needed        01/07/23 1602    polyethylene glycol (MIRALAX) 17 g packet  Daily        01/07/23 1602              Linwood Dibbles, MD 01/07/23 1609

## 2023-01-07 NOTE — Progress Notes (Signed)
Additional follow up call placed to [facility] to review getting patient scheduled for ADT injection.

## 2023-01-07 NOTE — Discharge Instructions (Signed)
take the medications as prescribed to help with the irritation in the rectal area.  Follow up with a GI doctor for further evaluation

## 2023-01-11 NOTE — Progress Notes (Signed)
RN called to inquire again regarding ADT at [facility].   Per triage nurse, awaiting authorization.  RN notified that initial request was initiated on 7/25.  Triage nurse understood the importance of obtain appointment and will work closely to ensure patient gets scheduled.  Will continue to follow.    RN left message with patient to update on status.

## 2023-01-13 NOTE — Progress Notes (Signed)
RN spoke with [name] at [facility] regarding need for ADT appointment.  Per [name] patient has not been scheduled yet due to concern with insurance.  RN informed [name] that patient had applied for Medicaid.  [name] voiced she would reach out to patient to obtain information.   RN spoke with patient, patient informed that [facility] did call and they are working together to [employer] information and coordinate for an ADT appointment.

## 2023-01-25 NOTE — Progress Notes (Signed)
RN received feedback from [facility] that patient's [facility] is not in network with [facility].    RN left message with patient to update and review possibility of referring to urology in [facility] if he is still residing in [facility].

## 2023-01-27 ENCOUNTER — Other Ambulatory Visit: Payer: Self-pay

## 2023-01-27 DIAGNOSIS — C61 Malignant neoplasm of prostate: Secondary | ICD-10-CM

## 2023-01-27 NOTE — Progress Notes (Addendum)
RN left message for call back to review referral for urology to continue ongoing follow up/care.   RN spoke with patient and he confirmed he is still residing in [facility].  Referral placed for [facility] at [facility].  Patient is aware.

## 2023-01-28 ENCOUNTER — Other Ambulatory Visit: Payer: Self-pay | Admitting: Urology

## 2023-01-28 DIAGNOSIS — C61 Malignant neoplasm of prostate: Secondary | ICD-10-CM

## 2023-01-28 NOTE — Progress Notes (Signed)
  Radiation Oncology         [phone] ________________________________  Name: Shaun Morgan MRN: [id number]  Date: 12/24/2022  DOB: 1960-11-28  End of Treatment Note  Diagnosis:    62 y.o. 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      Indication for treatment:  Curative, Definitive Radiotherapy       Radiation treatment dates:   10/28/22 - 12/24/22  Site/dose:  1. The prostate, seminal vesicles, and pelvic lymph nodes were initially treated to 45 Gy in 25 fractions of 1.8 Gy  2. The prostate only was boosted to 75 Gy with 15 additional fractions of 2.0 Gy; concurrent with ADT which was started 07/21/22.    Beams/energy:  1. The prostate, seminal vesicles, and pelvic lymph nodes were initially treated using VMAT intensity modulated radiotherapy delivering 6 megavolt photons. Image guidance was performed with CB-CT studies prior to each fraction. He was immobilized with a body fix lower extremity mold.  2. the prostate only was boosted using VMAT intensity modulated radiotherapy delivering 6 megavolt photons. Image guidance was performed with CB-CT studies prior to each fraction. He was immobilized with a body fix lower extremity mold.  Narrative: The patient tolerated radiation treatment relatively well with only minor urinary irritation and modest fatigue.    Plan: The patient will receive a call in about one month from the radiation oncology department. He will continue follow up with his urologist, Dr. Laverle Patter, as well.  ________________________________  Artist Pais. Kathrynn Running, M.D.

## 2023-01-30 DIAGNOSIS — Z419 Encounter for procedure for purposes other than remedying health state, unspecified: Secondary | ICD-10-CM | POA: Diagnosis not present

## 2023-02-01 ENCOUNTER — Ambulatory Visit
Admission: RE | Admit: 2023-02-01 | Discharge: 2023-02-01 | Disposition: A | Discharge status: Home / Self Care | Payer: Medicaid Other | Source: Ambulatory Visit | Attending: Radiation Oncology | Admitting: Radiation Oncology

## 2023-02-01 NOTE — Progress Notes (Signed)
  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    (as documented in provider EOT note)  Pre Treatment IPSS Score: 20 (as documented in the provider consult note)  The patient was not available for call today.   Symptoms of fatigue have improved since completing therapy.  Symptoms of bladder changes have improved since completing therapy. Current symptoms include polyuria, and medications for bladder symptoms include Tamsulosin.  Symptoms of bowel changes have improved since completing therapy. Current symptoms include constipation, and medications for bowel symptoms include Miralax.   Patient complains of being able to feel bulging around the Space Oar Gel pack. Denies pain. Patient has an upcoming apt w/ his urologist Dr. Laverle Patter 02/07/2023.  Post Treatment IPSS Score: IPSS Questionnaire (AUA-7): Over the past month.   1)  How often have you had a sensation of not emptying your bladder completely after you finish urinating?  5 - Almost always  2)  How often have you had to urinate again less than two hours after you finished urinating? 5 - Almost always  3)  How often have you found you stopped and started again several times when you urinated?  0 - Not at all  4) How difficult have you found it to postpone urination?  4 - More than half the time  5) How often have you had a weak urinary stream?  3 - About half the time  6) How often have you had to push or strain to begin urination?  1 - Less than 1 time in 5  7) How many times did you most typically get up to urinate from the time you went to bed until the time you got up in the morning?  5 - 5+ times  Total score:  23. Which indicates severe symptoms  0-7 mildly symptomatic   8-19 moderately  symptomatic   20-35 severely symptomatic    Patient has a scheduled follow up visit with his urologist, Dr. Laverle Patter, on 02/07/2023  for ongoing surveillance. He was counseled that PSA levels will be drawn in the urology office, and was reassured that additional time is expected to improve bowel and bladder symptoms. He was encouraged to call back with concerns or questions regarding radiation.   This concludes the interaction.  Ruel Favors, LPN

## 2023-02-02 DIAGNOSIS — G629 Polyneuropathy, unspecified: Secondary | ICD-10-CM | POA: Diagnosis not present

## 2023-02-02 DIAGNOSIS — E7800 Pure hypercholesterolemia, unspecified: Secondary | ICD-10-CM | POA: Diagnosis not present

## 2023-02-02 DIAGNOSIS — Z1159 Encounter for screening for other viral diseases: Secondary | ICD-10-CM | POA: Diagnosis not present

## 2023-02-02 DIAGNOSIS — D539 Nutritional anemia, unspecified: Secondary | ICD-10-CM | POA: Diagnosis not present

## 2023-02-02 DIAGNOSIS — M25512 Pain in left shoulder: Secondary | ICD-10-CM | POA: Diagnosis not present

## 2023-02-02 DIAGNOSIS — K5909 Other constipation: Secondary | ICD-10-CM | POA: Diagnosis not present

## 2023-02-02 DIAGNOSIS — B1920 Unspecified viral hepatitis C without hepatic coma: Secondary | ICD-10-CM | POA: Diagnosis not present

## 2023-02-02 DIAGNOSIS — I10 Essential (primary) hypertension: Secondary | ICD-10-CM | POA: Diagnosis not present

## 2023-02-02 DIAGNOSIS — M129 Arthropathy, unspecified: Secondary | ICD-10-CM | POA: Diagnosis not present

## 2023-02-02 DIAGNOSIS — Z79899 Other long term (current) drug therapy: Secondary | ICD-10-CM | POA: Diagnosis not present

## 2023-02-02 DIAGNOSIS — E559 Vitamin D deficiency, unspecified: Secondary | ICD-10-CM | POA: Diagnosis not present

## 2023-02-02 DIAGNOSIS — C61 Malignant neoplasm of prostate: Secondary | ICD-10-CM | POA: Diagnosis not present

## 2023-02-02 DIAGNOSIS — R7302 Impaired glucose tolerance (oral): Secondary | ICD-10-CM | POA: Diagnosis not present

## 2023-02-02 DIAGNOSIS — R5383 Other fatigue: Secondary | ICD-10-CM | POA: Diagnosis not present

## 2023-02-07 ENCOUNTER — Ambulatory Visit (INDEPENDENT_AMBULATORY_CARE_PROVIDER_SITE_OTHER): Payer: Medicaid Other | Admitting: Urology

## 2023-02-07 ENCOUNTER — Encounter: Payer: Self-pay | Admitting: Urology

## 2023-02-07 VITALS — BP 108/71 | HR 91 | Ht 70.0 in | Wt 218.0 lb

## 2023-02-07 DIAGNOSIS — N401 Enlarged prostate with lower urinary tract symptoms: Secondary | ICD-10-CM | POA: Diagnosis not present

## 2023-02-07 DIAGNOSIS — C61 Malignant neoplasm of prostate: Secondary | ICD-10-CM | POA: Diagnosis not present

## 2023-02-07 DIAGNOSIS — N138 Other obstructive and reflux uropathy: Secondary | ICD-10-CM | POA: Diagnosis not present

## 2023-02-07 LAB — URINALYSIS, ROUTINE W REFLEX MICROSCOPIC
Bilirubin, UA: NEGATIVE
Glucose, UA: NEGATIVE
Ketones, UA: NEGATIVE
Leukocytes,UA: NEGATIVE
Nitrite, UA: NEGATIVE
Protein,UA: NEGATIVE
RBC, UA: NEGATIVE
Specific Gravity, UA: 1.025 (ref 1.005–1.030)
Urobilinogen, Ur: 0.2 mg/dL (ref 0.2–1.0)
pH, UA: 6 (ref 5.0–7.5)

## 2023-02-07 LAB — BLADDER SCAN AMB NON-IMAGING

## 2023-02-07 NOTE — Progress Notes (Signed)
Assessment: 1. Malignant neoplasm of prostate (HCC); PSA 27.7; GG 4; high risk disease; IMRT + LT ADT   2. BPH with obstruction/lower urinary tract symptoms     Plan: I personally reviewed the patient's chart including provider notes, labs and imaging results. I reviewed his records from [facility]. PSA today Continue tamsulosin 0.8 mg nightly Need to get patient's insurance information for approval for Eligard Recommend ADT for total of 2 years due to high risk prostate cancer Advised patient to contact the office once he receives his insurance information.  Chief Complaint:  Chief Complaint  Patient presents with   Prostate Cancer    History of Present Illness:  Shaun Morgan is a 62 y.o. male who is seen in consultation from Margaretmary Dys, MD  for evaluation of prostate cancer. He was found to have an elevated PSA of 27.7 and a abnormal prostate exam with firmness on the left.  He underwent a prostate ultrasound and biopsy on 04/02/2022 which demonstrated Gleason 4+4 = 8 adenocarcinoma in 10/12 cores.  Prostate volume measured 48.5 g.  PSMA PET scan from 05/20/2022 was negative for metastatic disease with uptake at the base of the prostate and probable bilateral seminal vesicle involvement. He elected to proceed with ADT and IMRT.  He received a 81-month Eligard on 07/21/2022. He underwent placement of fiducial markers and SpaceOAR on 09/07/2022.  He developed urinary retention postoperatively requiring Foley catheter placement in the emergency department. His Foley was removed on 10/06/2022. He completed his radiation treatments on 12/24/2022.  He presents to establish care here.  He has continued lower urinary tract symptoms including hesitancy, primarily at night, sensation of incomplete emptying, decreased stream, and nocturia every hour.  His symptoms are better during the day.  His dysuria is improving.  No gross hematuria.  He is on tamsulosin 0.8 mg nightly. IPSS = 22  QOL = 6/6 today.    Past Medical History:  Past Medical History:  Diagnosis Date   Asthma    Hepatitis C    per detention nurse   Hypertension    Neuropathy    Syphilis    per detention nurse   Wears partial dentures    upper/lower    Past Surgical History:  Past Surgical History:  Procedure Laterality Date   CARPAL TUNNEL RELEASE     GOLD SEED IMPLANT N/A 09/07/2022   Procedure: GOLD SEED IMPLANT;  Surgeon: Despina Arias, MD;  Location: [facility];  Service: Urology;  Laterality: N/A;  30   SPACE OAR INSTILLATION N/A 09/07/2022   Procedure: SPACE OAR INSTILLATION;  Surgeon: Despina Arias, MD;  Location: [facility];  Service: Urology;  Laterality: N/A;    Allergies:  Allergies  Allergen Reactions   Acetaminophen Other (See Comments)    Hep C    Family History:  No family history on file.  Social History:  Social History   Tobacco Use   Smoking status: Former    Current packs/day: 0.00    Types: Cigarettes, Pipe    Quit date: 10/2021    Years since quitting: 1.2   Tobacco comments:    Patient incarcerated 10/2021, no nicotine products allowed since incarceration per detention center nurse  Substance Use Topics   Alcohol use: No   Drug use: Never    Review of symptoms:  Constitutional:  Negative for unexplained weight loss, night sweats, fever, chills ENT:  Negative for nose bleeds, sinus pain, painful swallowing CV:  Negative  for chest pain, shortness of breath, exercise intolerance, palpitations, loss of consciousness Resp:  Negative for cough, wheezing, shortness of breath GI:  Negative for nausea, vomiting, diarrhea, bloody stools GU:  Positives noted in HPI; otherwise negative for gross hematuria, urinary incontinence Neuro:  Negative for seizures, poor balance, limb weakness, slurred speech Psych:  Negative for lack of energy, depression, anxiety Endocrine:  Negative for polydipsia, polyuria, symptoms of hypoglycemia  (dizziness, hunger, sweating) Hematologic:  Negative for anemia, purpura, petechia, prolonged or excessive bleeding, use of anticoagulants  Allergic:  Negative for difficulty breathing or choking as a result of exposure to anything; no shellfish allergy; no allergic response (rash/itch) to materials, foods  Physical exam: BP 108/71   Pulse 91   Ht 5\' 10"  (1.778 m)   Wt 218 lb (98.9 kg)   BMI 31.28 kg/m  GENERAL APPEARANCE:  Well appearing, well developed, well nourished, NAD HEENT: Atraumatic, Normocephalic, oropharynx clear. NECK: Supple without lymphadenopathy or thyromegaly. LUNGS: Clear to auscultation bilaterally. HEART: Regular Rate and Rhythm without murmurs, gallops, or rubs. ABDOMEN: Soft, non-tender, No Masses. EXTREMITIES: Moves all extremities well.  Without clubbing, cyanosis, or edema. NEUROLOGIC:  Alert and oriented x 3, normal gait, CN II-XII grossly intact.  MENTAL STATUS:  Appropriate. BACK:  Non-tender to palpation.  No CVAT SKIN:  Warm, dry and intact.    Results: U/A:  negative  PVR:  10 ml

## 2023-02-08 LAB — PSA: Prostate Specific Ag, Serum: <0.1 ng/mL (ref 0.0–4.0)

## 2023-02-09 ENCOUNTER — Encounter: Payer: Self-pay | Admitting: Urology

## 2023-02-09 ENCOUNTER — Ambulatory Visit (INDEPENDENT_AMBULATORY_CARE_PROVIDER_SITE_OTHER): Payer: Medicaid Other | Admitting: Urology

## 2023-02-09 DIAGNOSIS — C61 Malignant neoplasm of prostate: Secondary | ICD-10-CM

## 2023-02-09 MED ORDER — LEUPROLIDE ACETATE (6 MONTH) 45 MG ~~LOC~~ KIT
45.0000 mg | PACK | Freq: Once | SUBCUTANEOUS | Status: AC
Start: 2023-02-09 — End: 2023-02-09
  Administered 2023-02-09: 45 mg via SUBCUTANEOUS

## 2023-02-09 NOTE — Progress Notes (Signed)
Eligard SubQ Injection   Due to Prostate Cancer patient is present today for a Eligard Injection.  Medication: Eligard 6 month Dose: 45 mg  Location: right upper outer buttocks Lot: [id number] Exp: 05/2024  Patient tolerated well, no complications were noted  Performed by: Arville Go CMA   PA approval dates:  No PA required, Medicaid.

## 2023-02-14 ENCOUNTER — Encounter: Payer: Self-pay | Admitting: *Deleted

## 2023-02-28 ENCOUNTER — Encounter: Payer: Self-pay | Admitting: *Deleted

## 2023-02-28 NOTE — Progress Notes (Signed)
Pt attended 12/16/22 screening event where his b/p was 126/85 and his blood sugar was 114. Since the initial event f/u documentation 01/03/23 when health equity team member networked with [facility] navigator, pt has been able to get Medicaid, is now living in [place], has been connected with [facility]ist at [employer] and has been keeping in touch with the oncology navigator on a regular basis throughout September. Pt also shared during 60 day f/u call today that he now has a primary care dr at [facility] on [facility] and that his cancer drs and Urology dr and PCP are "all talking to one another." Pt denies any current SDOH needs and has contact with the oncology care management team as indicated. No additional health equity team support indicated at this time.

## 2023-03-01 DIAGNOSIS — Z419 Encounter for procedure for purposes other than remedying health state, unspecified: Secondary | ICD-10-CM | POA: Diagnosis not present

## 2023-03-10 ENCOUNTER — Encounter: Payer: Self-pay | Admitting: *Deleted

## 2023-03-10 ENCOUNTER — Inpatient Hospital Stay: Payer: Medicaid Other | Attending: Radiation Oncology | Admitting: *Deleted

## 2023-03-10 DIAGNOSIS — C61 Malignant neoplasm of prostate: Secondary | ICD-10-CM

## 2023-03-10 NOTE — Progress Notes (Signed)
SCP reviewed and completed. Next PSA labs to be done in December.

## 2023-03-15 ENCOUNTER — Encounter: Payer: Self-pay | Admitting: *Deleted

## 2023-03-15 NOTE — Progress Notes (Signed)
Today this Navigator have talked to Leonette Most at [facility] at [facility] at [facility] to refer pt for a PCP. Leonette Most has pt's info and will call him to set up a PCP appt for him.

## 2023-04-01 DIAGNOSIS — Z419 Encounter for procedure for purposes other than remedying health state, unspecified: Secondary | ICD-10-CM | POA: Diagnosis not present

## 2023-04-06 ENCOUNTER — Encounter: Payer: Self-pay | Admitting: Family Medicine

## 2023-04-06 ENCOUNTER — Ambulatory Visit: Payer: Medicaid Other | Admitting: Family Medicine

## 2023-04-06 VITALS — BP 121/82 | HR 84 | Ht 69.0 in | Wt 232.0 lb

## 2023-04-06 DIAGNOSIS — Z0000 Encounter for general adult medical examination without abnormal findings: Secondary | ICD-10-CM

## 2023-04-06 DIAGNOSIS — I10 Essential (primary) hypertension: Secondary | ICD-10-CM | POA: Diagnosis not present

## 2023-04-06 DIAGNOSIS — E785 Hyperlipidemia, unspecified: Secondary | ICD-10-CM

## 2023-04-06 DIAGNOSIS — G629 Polyneuropathy, unspecified: Secondary | ICD-10-CM

## 2023-04-06 DIAGNOSIS — M5441 Lumbago with sciatica, right side: Secondary | ICD-10-CM

## 2023-04-06 DIAGNOSIS — N401 Enlarged prostate with lower urinary tract symptoms: Secondary | ICD-10-CM | POA: Diagnosis not present

## 2023-04-06 DIAGNOSIS — Z8619 Personal history of other infectious and parasitic diseases: Secondary | ICD-10-CM

## 2023-04-06 DIAGNOSIS — J45909 Unspecified asthma, uncomplicated: Secondary | ICD-10-CM | POA: Insufficient documentation

## 2023-04-06 DIAGNOSIS — N138 Other obstructive and reflux uropathy: Secondary | ICD-10-CM

## 2023-04-06 DIAGNOSIS — B1920 Unspecified viral hepatitis C without hepatic coma: Secondary | ICD-10-CM

## 2023-04-06 DIAGNOSIS — C61 Malignant neoplasm of prostate: Secondary | ICD-10-CM

## 2023-04-06 DIAGNOSIS — M25551 Pain in right hip: Secondary | ICD-10-CM | POA: Diagnosis not present

## 2023-04-06 DIAGNOSIS — M25512 Pain in left shoulder: Secondary | ICD-10-CM

## 2023-04-06 DIAGNOSIS — G8929 Other chronic pain: Secondary | ICD-10-CM | POA: Diagnosis not present

## 2023-04-06 DIAGNOSIS — J4530 Mild persistent asthma, uncomplicated: Secondary | ICD-10-CM | POA: Diagnosis not present

## 2023-04-06 MED ORDER — FLUTICASONE-SALMETEROL 100-50 MCG/ACT IN AEPB
1.0000 | INHALATION_SPRAY | Freq: Two times a day (BID) | RESPIRATORY_TRACT | 3 refills | Status: AC
Start: 2023-04-06 — End: ?

## 2023-04-06 MED ORDER — TAMSULOSIN HCL 0.4 MG PO CAPS
0.8000 mg | ORAL_CAPSULE | Freq: Every day | ORAL | 1 refills | Status: DC
Start: 2023-04-06 — End: 2023-04-12

## 2023-04-06 NOTE — Assessment & Plan Note (Signed)
Blood pressure is at goal for age and co-morbidities.   Recommendations: losartan-hctz 10-25 mg daily - BP goal <130/80 - monitor and log blood pressures at home - check around the same time each day in a relaxed setting - Limit salt to <2000 mg/day - Follow DASH eating plan (heart healthy diet) - limit alcohol to 2 standard drinks per day for men and 1 per day for women - avoid tobacco products - get at least 2 hours of regular aerobic exercise weekly Patient aware of signs/symptoms requiring further/urgent evaluation. Labs updated today.

## 2023-04-06 NOTE — Assessment & Plan Note (Signed)
Pain reported in lower back and hip. Continue Celebrex -Refer to orthopedics for further evaluation.

## 2023-04-06 NOTE — Assessment & Plan Note (Signed)
Limited range of motion and pain/weakness in the left shoulder for over a year. -Request previous medical records including X-rays. -Refer to orthopedics for further evaluation and management

## 2023-04-06 NOTE — Patient Instructions (Signed)
Thank you for choosing [facility] at [facility] for your Primary Care needs. I am excited for the opportunity to partner with you to meet your health care goals. It was a pleasure meeting you today!  Information on diet, exercise, and health maintenance recommendations are listed below. This is information to help you be sure you are on track for optimal health and monitoring.   Please look over this and let us know if you have any questions or if you have completed any of the health maintenance outside of [facility] so that we can be sure your records are up to date.  ___________________________________________________________  MyChart:  For all urgent or time sensitive needs we ask that you please call the office to avoid delays. Our number is [phone]. MyChart is not constantly monitored and due to the large volume of messages a day, replies may take up to 72 business hours.  MyChart Policy: MyChart allows for you to see your visit notes, after visit summary, provider recommendations, lab and tests results, make an appointment, request refills, and contact your provider or the office for non-urgent questions or concerns. Providers are seeing patients during normal business hours and do not have built in time to review MyChart messages.  We ask that you allow a minimum of 3 business days for responses to [employer]. For this reason, please do not send urgent requests through MyChart. Please call the office at [phone]. New and ongoing conditions may require a visit. We have virtual and in-person visits available for your convenience.  Complex MyChart concerns may require a visit. Your provider may request you schedule a virtual or in-person visit to ensure we are providing the best care possible. MyChart messages sent after 11:00 AM on Friday will not be received by the provider until Monday morning.    Lab and Test Results: You will receive your lab and test  results on MyChart as soon as they are completed and results have been sent by the lab or testing facility. Due to this service, you will receive your results BEFORE your provider.  I review lab and test results each morning prior to seeing patients. Some results require collaboration with other providers to ensure you are receiving the most appropriate care. For this reason, we ask that you please allow a minimum of 3-5 business days from the time that ALL results have been received for your provider to receive and review lab and test results and contact you about these.  Most lab and test result comments from the provider will be sent through MyChart. Your provider may recommend changes to the plan of care, follow-up visits, repeat testing, ask questions, or request an office visit to discuss these results. You may reply directly to this message or call the office to provide information for the provider or set up an appointment. In some instances, you will be called with test results and recommendations. Please let us know if this is preferred and we will make note of this in your chart to provide this for you.    If you have not heard a response to your lab or test results in 5 business days from all results returning to MyChart, please call the office to let us know. We ask that you please avoid calling prior to this time unless there is an emergent concern. Due to high call volumes, this can delay the resulting process.  After Hours: For all non-emergency after hours needs, please  call the office at [phone] and select the option to reach the on-call  service. On-call services are shared between multiple [facility] offices and therefore it will not be possible to speak directly with your provider. On-call providers may provide medical advice and recommendations, but are unable to provide refills for maintenance medications.  For all emergency or urgent medical needs after normal business hours, we  recommend that you seek care at the closest Urgent Care or Emergency Department to ensure appropriate treatment in a timely manner.  [facility] has a 24 hour emergency room located on the ground floor for your convenience.   Urgent Concerns During the Business Day Providers are seeing patients from 8AM to 5PM with a busy schedule and are most often not able to respond to non-urgent calls until the end of the day or the next business day. If you should have URGENT concerns during the day, please call and speak to the nurse or schedule a same day appointment so that we can address your concern without delay.   Thank you, again, for choosing me as your health care partner. I appreciate your trust and look forward to learning more about you!   Lollie Marrow Reola Calkins, DNP, FNP-C  ___________________________________________________________  Health Maintenance Recommendations Screening Testing Mammogram Every 1-2 years based on history and risk factors Starting at age 59 Pap Smear Ages 21-39 every 3 years Ages 77-65 every 5 years with HPV testing More frequent testing may be required based on results and history Colon Cancer Screening Every 1-10 years based on test performed, risk factors, and history Starting at age 24 Bone Density Screening Every 2-10 years based on history Starting at age 51 for women Recommendations for men differ based on medication usage, history, and risk factors AAA Screening One time ultrasound Men 90-69 years old who have ever smoked Lung Cancer Screening Low Dose Lung CT every 12 months Age 79-80 years with a 20 pack-year smoking history who still smoke or who have quit within the last 15 years  Screening Labs Routine  Labs: Complete Blood Count (CBC), Complete Metabolic Panel (CMP), Cholesterol (Lipid Panel) Every 6-12 months based on history and medications May be recommended more frequently based on current conditions or previous results Hemoglobin  A1c Lab Every 3-12 months based on history and previous results Starting at age 53 or earlier with diagnosis of diabetes, high cholesterol, BMI >26, and/or risk factors Frequent monitoring for patients with diabetes to ensure blood sugar control Thyroid Panel  Every 6 months based on history, symptoms, and risk factors May be repeated more often if on medication HIV One time testing for all patients 45 and older May be repeated more frequently for patients with increased risk factors or exposure Hepatitis C One time testing for all patients 62 and older May be repeated more frequently for patients with increased risk factors or exposure Gonorrhea, Chlamydia Every 12 months for all sexually active persons 13-24 years Additional monitoring may be recommended for those who are considered high risk or who have symptoms PSA Men 80-87 years old with risk factors Additional screening may be recommended from age 61-69 based on risk factors, symptoms, and history  Vaccine Recommendations Tetanus Booster All adults every 10 years Flu Vaccine All patients 6 months and older every year COVID Vaccine All patients 12 years and older Initial dosing with booster May recommend additional booster based on age and health history HPV Vaccine 2 doses all patients age 59-26 Dosing may be considered  for patients over 26 Shingles Vaccine (Shingrix) 2 doses all adults 50 years and older Pneumonia (Pneumovax 23) All adults 65 years and older May recommend earlier dosing based on health history Pneumonia (Prevnar 71) All adults 65 years and older Dosed 1 year after Pneumovax 23 Pneumonia (Prevnar 20) All adults 65 years and older (adults 19-64 with certain conditions or risk factors) 1 dose  For those who have not received Prevnar 13 vaccine previously   Additional Screening, Testing, and Vaccinations may be recommended on an individualized basis based on family history, health history, risk  factors, and/or exposure.  __________________________________________________________  Diet Recommendations for All Patients  I recommend that all patients maintain a diet low in saturated fats, carbohydrates, and cholesterol. While this can be challenging at first, it is not impossible and small changes can make big differences.  Things to try: Decreasing the amount of soda, sweet tea, and/or juice to one or less per day and replace with water While water is always the first choice, if you do not like water you may consider adding a water additive without sugar to improve the taste other sugar free drinks Replace potatoes with a brightly colored vegetable  Use healthy oils, such as canola oil or olive oil, instead of butter or hard margarine Limit your bread intake to two pieces or less a day Replace regular pasta with low carb pasta options Bake, broil, or grill foods instead of frying Monitor portion sizes  Eat smaller, more frequent meals throughout the day instead of large meals  An important thing to remember is, if you love foods that are not great for your health, you don't have to give them up completely. Instead, allow these foods to be a reward when you have done well. Allowing yourself to still have special treats every once in a while is a nice way to tell yourself thank you for working hard to keep yourself healthy.   Also remember that every day is a new day. If you have a bad day and "fall off the wagon", you can still climb right back up and keep moving along on your journey!  We have resources available to help you!  Some websites that may be helpful include: www.http://www.wall-moore.info/  Www.VeryWellFit.com _____________________________________________________________  Activity Recommendations for All Patients  I recommend that all adults get at least 30 minutes of moderate physical activity that elevates your heart rate at least 5 days out of the week.  Some examples  include: Walking or jogging at a pace that allows you to carry on a conversation Cycling (stationary bike or outdoors) Water aerobics Yoga Weight lifting Dancing If physical limitations prevent you from putting stress on your joints, exercise in a pool or seated in a chair are excellent options.  Do determine your MAXIMUM heart rate for activity: 220 - YOUR AGE = MAX Heart Rate   Remember! Do not push yourself too hard.  Start slowly and build up your pace, speed, weight, time in exercise, etc.  Allow your body to rest between exercise and get good sleep. You will need more water than normal when you are exerting yourself. Do not wait until you are thirsty to drink. Drink with a purpose of getting in at least 8, 8 ounce glasses of water a day plus more depending on how much you exercise and sweat.    If you begin to develop dizziness, chest pain, abdominal pain, jaw pain, shortness of breath, headache, vision changes, lightheadedness, or other concerning symptoms,  stop the activity and allow your body to rest. If your symptoms are severe, seek emergency evaluation immediately. If your symptoms are concerning, but not severe, please let us know so that we can recommend further evaluation.

## 2023-04-06 NOTE — Assessment & Plan Note (Addendum)
Following with oncology and urology (Dr. Pete Glatter) Treatment includes Eligard and tamsulosin 0.8 mg nightly Urology recommending ADT for 2 years due to high risk prostate cancer

## 2023-04-06 NOTE — Assessment & Plan Note (Signed)
Burning sensation in feet and numbness in fingers for over six months. Gabapentin 300mg  TID with limited relief. -Refer to a neurologist for further evaluation and possible nerve conduction studies. -Consider increasing Gabapentin dose.
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
## 2023-04-06 NOTE — Assessment & Plan Note (Signed)
Daily use of Albuterol indicating poor control. Shortness of breath reported. -Adding Wixela for additional management -Patient aware of signs/symptoms requiring further/urgent evaluation.

## 2023-04-07 LAB — HCV RNA QUANT
HCV log10: 6.897 {Log}
Hepatitis C Quantitation: 7890000 [IU]/mL

## 2023-04-07 LAB — LIPID PANEL
Cholesterol: 229 mg/dL — ABNORMAL HIGH (ref 0–200)
HDL: 95.6 mg/dL (ref >39.00)
LDL Cholesterol: 117 mg/dL — ABNORMAL HIGH (ref 0–99)
NonHDL: 132.98
Total CHOL/HDL Ratio: 2
Triglycerides: 82 mg/dL (ref 0.0–149.0)
VLDL: 16.4 mg/dL (ref 0.0–40.0)

## 2023-04-07 LAB — COMPREHENSIVE METABOLIC PANEL
ALT: 53 U/L (ref 0–53)
AST: 50 U/L — ABNORMAL HIGH (ref 0–37)
Albumin: 4.1 g/dL (ref 3.5–5.2)
Alkaline Phosphatase: 110 U/L (ref 39–117)
BUN: 28 mg/dL — ABNORMAL HIGH (ref 6–23)
CO2: 27 meq/L (ref 19–32)
Calcium: 9.8 mg/dL (ref 8.4–10.5)
Chloride: 104 meq/L (ref 96–112)
Creatinine, Ser: 1.61 mg/dL — ABNORMAL HIGH (ref 0.40–1.50)
GFR: 45.8 mL/min — ABNORMAL LOW (ref >60.00)
Glucose, Bld: 88 mg/dL (ref 70–99)
Potassium: 4.6 meq/L (ref 3.5–5.1)
Sodium: 139 meq/L (ref 135–145)
Total Bilirubin: 0.5 mg/dL (ref 0.2–1.2)
Total Protein: 7.7 g/dL (ref 6.0–8.3)

## 2023-04-07 LAB — CBC WITH DIFFERENTIAL/PLATELET
Basophils Absolute: 0.1 10*3/uL (ref 0.0–0.1)
Basophils Relative: 1.9 % (ref 0.0–3.0)
Eosinophils Absolute: 0.1 10*3/uL (ref 0.0–0.7)
Eosinophils Relative: 1.6 % (ref 0.0–5.0)
HCT: 34.4 % — ABNORMAL LOW (ref 39.0–52.0)
Hemoglobin: 11.5 g/dL — ABNORMAL LOW (ref 13.0–17.0)
Lymphocytes Relative: 24.3 % (ref 12.0–46.0)
Lymphs Abs: 1 10*3/uL (ref 0.7–4.0)
MCHC: 33.3 g/dL (ref 30.0–36.0)
MCV: 99.5 fL (ref 78.0–100.0)
Monocytes Absolute: 0.5 10*3/uL (ref 0.1–1.0)
Monocytes Relative: 12.4 % — ABNORMAL HIGH (ref 3.0–12.0)
Neutro Abs: 2.5 10*3/uL (ref 1.4–7.7)
Neutrophils Relative %: 59.8 % (ref 43.0–77.0)
Platelets: 174 10*3/uL (ref 150.0–400.0)
RBC: 3.45 Mil/uL — ABNORMAL LOW (ref 4.22–5.81)
RDW: 14.2 % (ref 11.5–15.5)
WBC: 4.1 10*3/uL (ref 4.0–10.5)

## 2023-04-07 LAB — TSH: TSH: 1.11 u[IU]/mL (ref 0.35–5.50)

## 2023-04-07 LAB — B12 AND FOLATE PANEL
Folate: 12.2 ng/mL (ref >5.9)
Vitamin B-12: 310 pg/mL (ref 211–911)

## 2023-04-07 LAB — HEMOGLOBIN A1C: Hgb A1c MFr Bld: 4.8 % (ref 4.6–6.5)

## 2023-04-08 MED ORDER — SIMVASTATIN 10 MG PO TABS
10.0000 mg | ORAL_TABLET | Freq: Every day | ORAL | 1 refills | Status: DC
Start: 2023-04-08 — End: 2023-05-24

## 2023-04-08 NOTE — Progress Notes (Signed)
Kidney function has declined some. I will compare to labs from [facility] when we get them. Please stay well hydrated. Recommend minimizing the Celebrex as it can be irritating to your kidneys  Cholesterol is high. Based on overall risk, would recommend starting cholesterol medication. Sending in rosuvastatin. We will recheck labs at next follow-up.  Heart healthy lifestyle encouraged.   Hepatitis C markers are elevated - referral sent to infectious disease  Follow-up in 3 months   The 10-year ASCVD risk score (Arnett DK, et al., 2019) is: 18%   Values used to calculate the score:     Age: 62 years     Sex: Male     Is Non-Hispanic African American: Yes     Diabetic: No     Tobacco smoker: Yes     Systolic Blood Pressure: 121 mmHg     Is BP treated: Yes     HDL Cholesterol: 95.6 mg/dL     Total Cholesterol: 229 mg/dL

## 2023-04-08 NOTE — Addendum Note (Signed)
Addended by: Hyman Hopes B on: 04/08/2023 01:43 PM   Modules accepted: Orders

## 2023-04-08 NOTE — Addendum Note (Signed)
Addended by: Hyman Hopes B on: 04/08/2023 01:45 PM   Modules accepted: Orders

## 2023-04-10 LAB — HEPATITIS C ANTIBODY: Hepatitis C Ab: REACTIVE — AB

## 2023-04-10 LAB — HEPATITIS C RNA QUANTITATIVE
HCV Quantitative Log: 6.79 {Log} — ABNORMAL HIGH
HCV RNA, PCR, QN: 6180000 [IU]/mL — ABNORMAL HIGH

## 2023-04-11 ENCOUNTER — Other Ambulatory Visit (HOSPITAL_COMMUNITY): Payer: Self-pay

## 2023-04-11 ENCOUNTER — Telehealth: Payer: Self-pay

## 2023-04-11 NOTE — Telephone Encounter (Signed)
[facility] Patient Product/process development scientist completed.    The patient is insured through [employer] and has a $4.00 copay.  We will continue to follow to see if copay assistance is needed.  Clearance Coots, CPhT Specialty Pharmacy Patient [facility] Phone: [phone] Fax:  [phone]

## 2023-04-12 ENCOUNTER — Other Ambulatory Visit: Payer: Self-pay | Admitting: Neurology

## 2023-04-12 DIAGNOSIS — C61 Malignant neoplasm of prostate: Secondary | ICD-10-CM

## 2023-04-12 DIAGNOSIS — N138 Other obstructive and reflux uropathy: Secondary | ICD-10-CM

## 2023-04-12 MED ORDER — TAMSULOSIN HCL 0.4 MG PO CAPS
0.8000 mg | ORAL_CAPSULE | Freq: Every day | ORAL | 1 refills | Status: DC
Start: 2023-04-12 — End: 2024-02-21

## 2023-04-14 ENCOUNTER — Other Ambulatory Visit: Payer: Self-pay

## 2023-04-14 ENCOUNTER — Encounter: Payer: Self-pay | Admitting: Infectious Diseases

## 2023-04-14 ENCOUNTER — Ambulatory Visit (INDEPENDENT_AMBULATORY_CARE_PROVIDER_SITE_OTHER): Payer: Medicaid Other | Admitting: Infectious Diseases

## 2023-04-14 VITALS — BP 120/81 | HR 94 | Temp 97.9°F | Wt 229.0 lb

## 2023-04-14 DIAGNOSIS — B182 Chronic viral hepatitis C: Secondary | ICD-10-CM

## 2023-04-14 NOTE — Progress Notes (Signed)
Patient Name: Shaun Morgan  Date of Birth: 08-23-1960  MRN: [id number]  PCP: Clayborne Dana, NP  Referring Provider: Clayborne Dana, NP, Ph#: [phone]   Subjective   Subjective:  CC: New patient - initial evaluation and management of chronic hepatitis C infection.    History of Present Illness   Mr. Quigg is here today for initial evaluation of chronic hepatitis C infection. He says that he was known to have Hepatitis C since 1992, has never received treatment for the condition. He denies any liver-related complications such as swelling of abdomen/legs, skin changes, or bleeding. They have been physically active, but recent rotator cuff issues have temporarily halted their exercise routine.   The patient is also being treated for prostate cancer and has recently completed radiation therapy. They are currently on Flomax for urinary symptoms, Simvastatin for cholesterol, Gabapentin for nerve pain, Lisinopril Hydrochlorothiazide for blood pressure, and Advair for asthma symptoms. They have recently quit smoking and transitioned to vaping in an effort to avoid cigars. The patient has also recently adopted a vegan diet in an effort to manage their cholesterol levels without medication. They have a history of alcohol use but have reduced their intake to a couple of beers per week.   The patient has also reported a recent onset of nosebleeds, which have been ongoing for two days. They have been using a copper bracelet for arthritis and recently tried a copper hat, which they suspect may be related to the nosebleeds.  The patient denies any history of heart attack or stroke. They have been experiencing fatigue and have been advised to take Tylenol for headaches. They are currently awaiting further tests to assess their liver function and the impact of Hepatitis C on their liver.     Hep C RNA leel 11/06 at PCP visit revealed 6,180,000 copies   Fibrosis 4 Score = 2.41 (Indeterminate)         Interpretation for patients with HCV          <1.45       -  F0-F1 (Low risk)          1.45-3.25 -  Indeterminate           >3.25      -  F3-F4 (High risk)     Validated for ages 9-65   Review of Systems  HENT:  Positive for nosebleeds.   All other systems reviewed and are negative.    Past Medical History:  Diagnosis Date   Asthma    Hepatitis C    per detention nurse   Hypertension    Neuropathy    Syphilis    per detention nurse   Wears partial dentures    upper/lower    Outpatient Medications Prior to Visit  Medication Sig Dispense Refill   albuterol (PROVENTIL HFA;VENTOLIN HFA) 108 (90 BASE) MCG/ACT inhaler Inhale 2 puffs into the lungs every 6 (six) hours as needed for wheezing or shortness of breath.     bismuth subsalicylate (PEPTO-BISMOL) 262 MG chewable tablet Chew 2 tablets (524 mg total) by mouth as needed. 30 tablet 1   Calcium Carb-Cholecalciferol (CALCIUM + VITAMIN D3) 600-10 MG-MCG TABS Take 1 tablet by mouth 2 (two) times daily with a meal. 60 tablet 2   fluticasone-salmeterol (WIXELA INHUB) 100-50 MCG/ACT AEPB Inhale 1 puff into the lungs 2 (two) times daily. 1 each 3   gabapentin (NEURONTIN) 300 MG capsule Take 300 mg by mouth 3 (three) times daily.  hydrocortisone cream 1 % Apply 1 Application topically 2 (two) times daily. 30 g 0   losartan-hydrochlorothiazide (HYZAAR) 100-25 MG tablet Take 1 tablet by mouth daily.     simvastatin (ZOCOR) 10 MG tablet Take 1 tablet (10 mg total) by mouth daily. 90 tablet 1   tamsulosin (FLOMAX) 0.4 MG CAPS capsule Take 2 capsules (0.8 mg total) by mouth daily after supper. 180 capsule 1   celecoxib (CELEBREX) 200 MG capsule Take 200 mg by mouth daily. (Patient not taking: Reported on 04/14/2023)     lidocaine (XYLOCAINE) 5 % ointment Apply 1 Application topically as needed. (Patient not taking: Reported on 04/14/2023) 35.44 g 0   neomycin-bacitracin-polymyxin (NEOSPORIN) [phone] ointment Apply topically 4 (four)  times daily. (Patient not taking: Reported on 04/14/2023) 28.4 g 0   phenazopyridine (AZO-STANDARD) 95 MG tablet Take 1 tablet (95 mg total) by mouth 3 (three) times daily as needed for pain. (Patient not taking: Reported on 04/14/2023) 30 tablet 1   polyethylene glycol (MIRALAX) 17 g packet Take 17 g by mouth daily. (Patient not taking: Reported on 04/14/2023) 14 each 0   Pramox-PE-Glycerin-Petrolatum (PREPARATION H) 1-0.25-14.4-15 % CREA Use as directed (Patient not taking: Reported on 04/14/2023) 26 g 1   pramoxine (PROCTOFOAM) 1 % foam Place 1 Application rectally 3 (three) times daily as needed for anal itching. (Patient not taking: Reported on 04/14/2023) 15 g 0   No facility-administered medications prior to visit.     Allergies  Allergen Reactions   Acetaminophen Other (See Comments)    Hep C    Social History   Tobacco Use   Smoking status: Former    Current packs/day: 0.00    Types: Cigarettes, Pipe, E-cigarettes    Quit date: 10/2021    Years since quitting: 1.4   Tobacco comments:    Patient incarcerated 10/2021, no nicotine products allowed since incarceration per detention center nurse  Substance Use Topics   Alcohol use: Yes    Alcohol/week: 6.0 standard drinks of alcohol    Types: 6 Standard drinks or equivalent per week   Drug use: Never    Family History  Problem Relation Age of Onset   Epilepsy Mother    Heart disease Father        Objective   Objective:   Vitals:   04/14/23 0842  BP: 120/81  Pulse: 94  Temp: 97.9 F (36.6 C)  TempSrc: Oral  SpO2: 96%  Weight: 229 lb (103.9 kg)   Body mass index is 33.82 kg/m.  Constitutional: in no apparent distress, well developed and well nourished, and oriented times 3 Eyes: anicteric Cardiovascular: Cor RRR and No murmurs Respiratory: clear Gastrointestinal: Bowel sounds are normal, liver is not enlarged, spleen is not enlarged Musculoskeletal: peripheral pulses normal, no pedal edema, no clubbing  or cyanosis Skin: negative for - jaundice, spider hemangioma, telangiectasia, palmar erythema, ecchymosis and atrophy; no porphyria cutanea tarda Lymphatic: no cervical lymphadenopathy      Assessment & Plan:     Hepatitis C Chronic infection since 1992, no prior treatment. Mechanism of transmission unknown. Discussed the evolution of treatment options and the current oral regimen to offer typically > 95% cure. No signs of liver disease or cirrhosis reported. PET scan done in Dec 2023 without any metabolic activity in the liver  FIB4 score is in the indeterminate range regarding fibrosis assessment.  -Fibrotest, INR, Genotype, hep b serologies today -Pneumovax at return visit given cancer and liver disease  -Elastography of liver ordered  -  Plan to initiate oral antiviral therapy pending insurance approval and test results.  Hyperlipidemia On Simvastatin for primary prevention. Discussed potential interaction with Hepatitis C treatment. -Hold Simvastatin during Hepatitis C treatment to reduce risk of side effects.  Hypertension On Lisinopril-Hydrochlorothiazide. No issues reported. -Continue current management.  Chronic Obstructive Pulmonary Disease (COPD) On Advair. Recently quit smoking and transitioned to vaping. -Continue Advair. Encourage cessation of vaping when ready.  Prostate Cancer Completing treatments and follow up with oncology team. On Flomax. No issues reported. -Continue management per oncology .  General Health Maintenance -Check Hepatitis B status and administer booster vaccine if needed. -Advise moderation in alcohol consumption due to Hepatitis C and potential liver inflammation. -Agree with diet changes as long as he continues to feel good. May need B12 supplement and focus on adequate protein sources w/o meat especially with return to exercise  -Advise use of Tylenol for headache, limiting to two extra strength twice a day.       Orders Placed This  Encounter  Procedures   US ABDOMEN RUQ W/ELASTOGRAPHY    Standing Status:   Future    Standing Expiration Date:   10/12/2023    Order Specific Question:   Reason for Exam (SYMPTOM  OR DIAGNOSIS REQUIRED)    Answer:   hep c    Order Specific Question:   Preferred imaging location?    Answer:   [facility]   Hepatitis C genotype   Liver Fibrosis, FibroTest-ActiTest   Hepatitis B surface antigen   Hepatitis B Core Antibody, total   Hepatitis B surface antibody,qualitative   Protime-INR    No orders of the defined types were placed in this encounter.   Return in about 6 weeks (around 05/26/2023).  Rexene Alberts, MSN, NP-C [facility]  [email or web address] Pager: [phone] Office: [phone] [facility] Main Line: [phone] *Secure Chat Communication Welcome   Discussed the use of AI scribe software for clinical note transcription with the patient, who gave verbal consent to proceed.

## 2023-04-14 NOTE — Patient Instructions (Signed)
Nice to meet you today!    We need to get a little more information about your hepatitis c infection before we start your treatment. I anticipate that we can get you started in a few weeks after we submit approval to your insurance to ensure payment. We may need to place referral for an ultrasound and/or gastroenterology if your blood work indicates more damage to the liver than expected.     ABOUT HEPATITIS C VIRUS:  Chronic Hepatitis C is the most common blood-borne infection in the [place], affecting approximately 3 million people.  It is the leading cause of cirrhosis, liver cancer, and end stage liver disease requiring transplantation when this infection goes untreated for many years  The majority of people who are infected are unaware because there are not many early symptoms that are specific to this and often go undiagnosed until a specific blood test is drawn.   The hepatitis c virus is passed primarily through direct exposure of contaminated blood or body fluids. It is most efficiently transmitted through repeated exposure to infected blood.  Risk for sexual transmission is very low but is possible if there is high frequency of unprotected sexual activity with known hepatitis c partner or multiple partners of known status.  Over time, approximately 60-70% of people can develop some degree of liver disease. Cirrhosis occurs in 10-20% of those with chronic infection. 1-5% will get liver cancer, which has a very high rate of death.   Approximately 15-25% clear the infection without medication (usually in the first 6 months of becoming exposed to virus)  Newer medications provide over 95% cure rate when taken as prescribed    IN GENERAL ABOUT DIET  Persons living with chronic hepatitis c infection should eat a diet to maintain a healthy weight and avoid nutritional deficiencies.   Completely avoiding alcohol is the best decision for your liver health. If unable to do so  please limit alcohol to as little as possible to less than 1 standard drink a day - this is very irritating to your liver.  Limit tylenol use to less than 2,000 mg daily (two extra strength tablets only twice a day)  If you have cirrhosis of the liver please take no more than 1,000 mg tylenol a day  Patients with cirrhosis should not have protein restriction; we recommend a protein intake of approximately 1.2-1.5 g/kg/day.   For patients with cirrhosis and hepatic encephalopathy, the American Association for the Study of Liver Diseases (AASLD) recommended protein intake is 1.2-1.5 g/kg/day.  If you experience ascites (fluid accumulation in the abdomen associated with severe liver damage / cirrhosis) please limit sodium intake to < 2000 mg a day    UNTIL YOU HAVE BEEN TREATED AND CURED:  Use condoms with all sexual encounters or practice abstinence to avoid sexual transmission   No sharing of razors, toothbrushes, nail clippers or anything that could potentially have blood on it.   If you cut yourself please clean and cover any wounds or open sores to others do not come into contact with your blood.   If blood spills onto item/surface please clean with 1:10 bleach solution and allow to dry, EVEN if it is dried blood.    GENERAL HELPFUL HINTS ON HCV THERAPY:  1. Stay well-hydrated.  2. Notify the [facility] of any changes in your other over-the-counter/herbal or prescription medications.  3. If you miss a dose of your medication, take the missed dose as soon as you remember. Return  to your regular time/dose schedule the next day.   4.  Do not stop taking your medications without first talking with your healthcare provider.  5.  You will see our pharmacist-specialist within the first 2 weeks of starting your medication to monitor for any possible side effects.  6.  You will have blood work once during treatment 4 weeks after your first pill. Again soon after treatment is completed  and one final lab 3 months after your last pill to ensure cure!   TIPS TO BE SUCCESSFUL WITH DAILY MEDICATION USE:  1. Set a reminder on your phone  2. Try filling out a pill box for the week - pick a day and put one pill for every day during the week so you know right away if you missed a pill.   3. Have a trusted family member ask you about your medications.   4. Smartphone app    Medication we would like to use for you will be :   [place] Instructions:  Take Epclusa, one tablet daily with or without food. You should take it at approximately the same time every day. Your treatment will be for 12 weeks. Do not miss a dose.    Do not run out of Epclusa! If you are down to one week of medication left and have not heard about your next shipment, please let us know as soon as possible. You will be given 28 days of medication at a time and provided with 2 refills.   If you need to start a new medication, prescription from your doctor or over the counter medication, you need to contact us to make sure it does not interfere with Epclusa. There are several medications that can interfere with Epclusa and can make you sick or make the medication not work.  If you need to take a medication for acid reflux, your choices are as follows: TUMS or Rolaids separated at least four hours from [place].  Pepcid (famotidine) 40mg  up to twice per day administered with Epclusa or 12 hours apart from [place].    Tylenol (acetominophen) and Advil (ibuprofen) are safe to take with Epclusa if needed for headache, fever, pain.   DO NOT stop Epclusa unless instructed to by your provider. If you are hospitalized while taking this medication please bring it with you to the hospital to avoid interruption of therapy. Every pill is important!  The most common side effects associated with Epclusa  include:  Fatigue Headache Nausea Diarrhea Insomnia

## 2023-04-15 ENCOUNTER — Encounter: Payer: Self-pay | Admitting: Orthopedic Surgery

## 2023-04-15 ENCOUNTER — Other Ambulatory Visit: Payer: Self-pay

## 2023-04-15 ENCOUNTER — Ambulatory Visit (INDEPENDENT_AMBULATORY_CARE_PROVIDER_SITE_OTHER): Payer: Medicaid Other | Admitting: Orthopedic Surgery

## 2023-04-15 VITALS — BP 119/84 | HR 85 | Ht 69.0 in | Wt 229.0 lb

## 2023-04-15 DIAGNOSIS — M5441 Lumbago with sciatica, right side: Secondary | ICD-10-CM

## 2023-04-15 DIAGNOSIS — G8929 Other chronic pain: Secondary | ICD-10-CM

## 2023-04-15 DIAGNOSIS — M19012 Primary osteoarthritis, left shoulder: Secondary | ICD-10-CM

## 2023-04-15 NOTE — Progress Notes (Signed)
Orthopedic Spine Surgery Office Note  Assessment: Patient is a 62 y.o. male with 2 issues:  1) left shoulder pain consistent with osteoarthritis 2) low back pain that radiates into bilateral buttock and along the right lateral leg, suspect radiculopathy   Plan: -Explained that initially conservative treatment is tried as a significant number of patients may experience relief with these treatment modalities. Discussed that the conservative treatments include:  -activity modification  -physical therapy  -over the counter pain medications  -medrol dosepak  -lumbar steroid injections -Patient has tried conservative treatments for over 6 weeks, so recommended MRI of the lumbar spine to evaluate further -Talked about treatment options for shoulder osteoarthritis and patient wanted to proceed with a intra-articular injection which was done today in the office -Patient has tried: Tylenol, gabapentin, lumbar steroid injections -Will plan to refer him to Dr. August Saucer if he is still having shoulder pain at her next visit to discuss possible shoulder arthroplasty -Patient should return to office in 4 weeks, x-rays at next visit: None   Left shoulder injection note: After discussing the risks, benefits, alternatives of left shoulder intra-articular injection, patient elected to proceed.  The posterior soft spot underneath the acromion was prepped with an alcohol based prep.  The patient was in the seated position.  The skin was anesthetized over the posterior soft spot with alcohol based prep.  A 20-gauge needle was used to inject 1 cc of bupivacaine, 1 cc of lidocaine, 1 cc of Depo-Medrol under standard sterile technique.  Needle was withdrawn and Band-Aid was applied.  Patient tolerated procedure well.  ___________________________________________________________________________   History:  Patient is a 62 y.o. male who presents today for lumbar spine.  Patient has had over 5 years of low back pain that  radiates into the right lower extremity.  It also radiates into the left buttock.  He feels the pain in the right lower extremity along the lateral aspect of the thigh and leg.  He describes it as a burning sensation.  He said that has gotten worse within the last 2 months.  He has previously gotten lumbar steroid injections which she found helpful.  His last 1 was several years ago and he is interested in potentially doing the treatment again.  There was no trauma or injury that preceded the onset of his pain.  He also has had 3 years of left shoulder pain.  He does not recall any trauma or injury to the shoulder.  Pain is worse with motion.  He does not have pain at rest.  He says he is has difficulty with overhead activity as result of the pain in his shoulder.  He said he cannot lift his arm above shoulder height.   Weakness: Yes, left shoulder feels weaker.  No other weakness noted Symptoms of imbalance: Denies Paresthesias and numbness: Denies Bowel or bladder incontinence: Denies Saddle anesthesia: Denies  Treatments tried: Tylenol, gabapentin, lumbar steroid injections  Review of systems: Denies fevers and chills, night sweats, unexplained weight loss, history of cancer.  Has had pain that wakes him at night  Past medical history: History of prostate cancer HLD HTN Anxiety Hepatitis C Neuropathy Chronic pain  Allergies: NKDA  Past surgical history:  Carpal tunnel release  Social history: Denies use of nicotine product (smoking, vaping, patches, smokeless) Alcohol use: Yes, 10 beers per week Denies recreational drug use   Physical Exam:  BMI of 33.8  General: no acute distress, appears stated age Neurologic: alert, answering questions appropriately, following commands  Respiratory: unlabored breathing on room air, symmetric chest rise Psychiatric: appropriate affect, normal cadence to speech   MSK (spine):  -Strength exam      Left  Right EHL     5/5  5/5 TA    5/5  5/5 GSC    5/5  5/5 Knee extension  5/5  5/5 Hip flexion   5/5  4/5  -Sensory exam    Sensation intact to light touch in L3-S1 nerve distributions of bilateral lower extremities  -Achilles DTR: 2/4 on the left, 2/4 on the right -Patellar tendon DTR: 2/4 on the left, 2/4 on the right  -Straight leg raise: negative bilaterally -Femoral nerve stretch test: negative bilaterally -Clonus: no beats bilaterally  -Right hip exam: no pain through range of motion, negative FADIR, negative FABER, positive stinchfield -Left hip exam: no pain through range of motion  Imaging: XRs of the lumbar spine from 04/15/2023 were independently reviewed and interpreted, showing spondylolisthesis at L4/5 that shifts approximately 1mm between flexion and extension views.  Disc height loss at L5/S1 with anterior osteophyte formation.  No other significant degenerative changes seen.  No fracture or dislocation noted.  CT scan of the abdomen and pelvis from 07/08/2021 was independent reviewed and interpreted, showing spondylolisthesis at L4/5.  Vacuum disc phenomenon at L5/S1 with subchondral cyst formation and endplate sclerosis.  Foraminal stenosis seen bilaterally at L5/S1.  Significant facet arthropathy at L4/5 and L5/S1.  XRs of the left shoulder from 12/10/2022 were independently reviewed and interpreted, showing no fracture or dislocation.  Significant joint space narrowing and subchondral sclerosis with osteophyte formation.    Patient name: Shaun Morgan Patient MRN: [id number] Date of visit: 04/15/23

## 2023-04-21 ENCOUNTER — Encounter (HOSPITAL_COMMUNITY): Payer: Self-pay | Admitting: Interventional Radiology

## 2023-04-21 ENCOUNTER — Ambulatory Visit (HOSPITAL_COMMUNITY)
Admission: RE | Admit: 2023-04-21 | Discharge: 2023-04-21 | Disposition: A | Discharge status: Home / Self Care | Payer: Medicaid Other | Source: Ambulatory Visit | Attending: Infectious Diseases | Admitting: Infectious Diseases

## 2023-04-21 DIAGNOSIS — B182 Chronic viral hepatitis C: Secondary | ICD-10-CM | POA: Diagnosis not present

## 2023-04-21 LAB — PROTIME-INR
INR: 1.1
Prothrombin Time: 11.3 s (ref 9.0–11.5)

## 2023-04-21 LAB — HEPATITIS B CORE ANTIBODY, TOTAL: Hep B Core Total Ab: NONREACTIVE

## 2023-04-21 LAB — LIVER FIBROSIS, FIBROTEST-ACTITEST
ALT: 45 U/L (ref 9–46)
Alpha-2-Macroglobulin: 294 mg/dL — ABNORMAL HIGH (ref 106–279)
Apolipoprotein A1: 251 mg/dL — ABNORMAL HIGH (ref 94–176)
Bilirubin: 0.6 mg/dL (ref 0.2–1.2)
Fibrosis Score: 0.24
GGT: 45 U/L (ref 3–70)
Haptoglobin: 140 mg/dL (ref 43–212)
Necroinflammat ACT Score: 0.24
Reference ID: 5212518

## 2023-04-21 LAB — HEPATITIS B SURFACE ANTIGEN: Hepatitis B Surface Ag: NONREACTIVE

## 2023-04-21 LAB — HEPATITIS C GENOTYPE

## 2023-04-21 LAB — HEPATITIS B SURFACE ANTIBODY,QUALITATIVE: Hep B S Ab: NONREACTIVE

## 2023-04-27 ENCOUNTER — Emergency Department (HOSPITAL_COMMUNITY)
Admission: EM | Admit: 2023-04-27 | Discharge: 2023-04-27 | Disposition: A | Discharge status: Home / Self Care | Payer: Medicaid Other

## 2023-04-27 ENCOUNTER — Other Ambulatory Visit: Payer: Self-pay

## 2023-04-27 ENCOUNTER — Encounter (HOSPITAL_COMMUNITY): Payer: Self-pay | Admitting: Emergency Medicine

## 2023-04-27 DIAGNOSIS — I10 Essential (primary) hypertension: Secondary | ICD-10-CM | POA: Diagnosis not present

## 2023-04-27 DIAGNOSIS — D649 Anemia, unspecified: Secondary | ICD-10-CM | POA: Insufficient documentation

## 2023-04-27 DIAGNOSIS — K921 Melena: Secondary | ICD-10-CM | POA: Insufficient documentation

## 2023-04-27 LAB — BASIC METABOLIC PANEL
Anion gap: 9 (ref 5–15)
BUN: 28 mg/dL — ABNORMAL HIGH (ref 8–23)
CO2: 19 mmol/L — ABNORMAL LOW (ref 22–32)
Calcium: 9.2 mg/dL (ref 8.9–10.3)
Chloride: 105 mmol/L (ref 98–111)
Creatinine, Ser: 1.67 mg/dL — ABNORMAL HIGH (ref 0.61–1.24)
GFR, Estimated: 46 mL/min — ABNORMAL LOW (ref >60)
Glucose, Bld: 105 mg/dL — ABNORMAL HIGH (ref 70–99)
Potassium: 4.3 mmol/L (ref 3.5–5.1)
Sodium: 133 mmol/L — ABNORMAL LOW (ref 135–145)

## 2023-04-27 LAB — TYPE AND SCREEN
ABO/RH(D): B POS
Antibody Screen: NEGATIVE

## 2023-04-27 LAB — CBC WITH DIFFERENTIAL/PLATELET
Abs Immature Granulocytes: 0.02 10*3/uL (ref 0.00–0.07)
Basophils Absolute: 0 10*3/uL (ref 0.0–0.1)
Basophils Relative: 1 %
Eosinophils Absolute: 0.1 10*3/uL (ref 0.0–0.5)
Eosinophils Relative: 1 %
HCT: 33.1 % — ABNORMAL LOW (ref 39.0–52.0)
Hemoglobin: 10.5 g/dL — ABNORMAL LOW (ref 13.0–17.0)
Immature Granulocytes: 1 %
Lymphocytes Relative: 26 %
Lymphs Abs: 1.1 10*3/uL (ref 0.7–4.0)
MCH: 31.8 pg (ref 26.0–34.0)
MCHC: 31.7 g/dL (ref 30.0–36.0)
MCV: 100.3 fL — ABNORMAL HIGH (ref 80.0–100.0)
Monocytes Absolute: 0.5 10*3/uL (ref 0.1–1.0)
Monocytes Relative: 12 %
Neutro Abs: 2.5 10*3/uL (ref 1.7–7.7)
Neutrophils Relative %: 59 %
Platelets: 153 10*3/uL (ref 150–400)
RBC: 3.3 MIL/uL — ABNORMAL LOW (ref 4.22–5.81)
RDW: 12.2 % (ref 11.5–15.5)
WBC: 4.2 10*3/uL (ref 4.0–10.5)
nRBC: 0 % (ref 0.0–0.2)

## 2023-04-27 LAB — PROTIME-INR
INR: 1.1 (ref 0.8–1.2)
Prothrombin Time: 14.2 s (ref 11.4–15.2)

## 2023-04-27 LAB — APTT: aPTT: 29 s (ref 24–36)

## 2023-04-27 LAB — POC OCCULT BLOOD, ED: Fecal Occult Bld: NEGATIVE

## 2023-04-27 NOTE — Discharge Instructions (Addendum)
Stop drinking beet juice to see if your symptoms subside.  Please follow-up with with your primary doctor and with gastroenterology.  Return immediately if develop fevers, chills, lightheadedness, dizziness, passout, abdominal pain, nausea vomiting persistent bright red blood in stool or dark black tarry stools.  You may also return if develop any new or worsening symptoms that are concerning to you.

## 2023-04-27 NOTE — ED Triage Notes (Signed)
PT reports bright red and dark colored stools that started yesterday. Pt reports feeling lightheaded and diaphoretic.

## 2023-04-27 NOTE — ED Provider Notes (Signed)
EMERGENCY DEPARTMENT AT [facility] Provider Note   CSN: [id number] Arrival date & time: 04/27/23  1814     History  Chief Complaint  Patient presents with   GI Bleeding    Shaun Morgan is a 62 y.o. male.  62 year old male present emergency department with bright red-colored stool.  Reported drinking beet juice the past 2 days.  Today noted dark stool with what appeared to be blood in it.  No abdominal pain.  No fevers no chills.  Not on blood thinners.  Triage note states patient feeling lightheaded and diaphoretic.  Denies those symptoms to me.        Home Medications Prior to Admission medications   Medication Sig Start Date End Date Taking? Authorizing Provider  albuterol (PROVENTIL HFA;VENTOLIN HFA) 108 (90 BASE) MCG/ACT inhaler Inhale 2 puffs into the lungs every 6 (six) hours as needed for wheezing or shortness of breath.    [provider]  bismuth subsalicylate (PEPTO-BISMOL) 262 MG chewable tablet Chew 2 tablets (524 mg total) by mouth as needed. 12/27/22   Bruning, Ashlyn, PA-C  Calcium Carb-Cholecalciferol (CALCIUM + VITAMIN D3) 600-10 MG-MCG TABS Take 1 tablet by mouth 2 (two) times daily with a meal. 12/27/22   Bruning, Ashlyn, PA-C  celecoxib (CELEBREX) 200 MG capsule Take 200 mg by mouth daily. Patient not taking: Reported on 04/14/2023 03/01/23   [provider]  fluticasone-salmeterol (WIXELA INHUB) 100-50 MCG/ACT AEPB Inhale 1 puff into the lungs 2 (two) times daily. 04/06/23   Clayborne Dana, NP  gabapentin (NEURONTIN) 300 MG capsule Take 300 mg by mouth 3 (three) times daily.    [provider]  hydrocortisone cream 1 % Apply 1 Application topically 2 (two) times daily. 12/27/22   Bruning, Ashlyn, PA-C  lidocaine (XYLOCAINE) 5 % ointment Apply 1 Application topically as needed. Patient not taking: Reported on 04/14/2023 01/07/23   Linwood Dibbles, MD  losartan-hydrochlorothiazide (HYZAAR) 100-25 MG tablet Take 1  tablet by mouth daily.    [provider]  neomycin-bacitracin-polymyxin (NEOSPORIN) [phone] ointment Apply topically 4 (four) times daily. Patient not taking: Reported on 04/14/2023 12/27/22   Bruning, Ashlyn, PA-C  phenazopyridine (AZO-STANDARD) 95 MG tablet Take 1 tablet (95 mg total) by mouth 3 (three) times daily as needed for pain. Patient not taking: Reported on 04/14/2023 12/27/22   Bruning, Ashlyn, PA-C  polyethylene glycol (MIRALAX) 17 g packet Take 17 g by mouth daily. Patient not taking: Reported on 04/14/2023 01/07/23   Linwood Dibbles, MD  Pramox-PE-Glycerin-Petrolatum (PREPARATION H) 1-0.25-14.4-15 % CREA Use as directed Patient not taking: Reported on 04/14/2023 12/27/22   Bruning, Ashlyn, PA-C  pramoxine (PROCTOFOAM) 1 % foam Place 1 Application rectally 3 (three) times daily as needed for anal itching. Patient not taking: Reported on 04/14/2023 01/07/23   Linwood Dibbles, MD  simvastatin (ZOCOR) 10 MG tablet Take 1 tablet (10 mg total) by mouth daily. 04/08/23   Clayborne Dana, NP  tamsulosin (FLOMAX) 0.4 MG CAPS capsule Take 2 capsules (0.8 mg total) by mouth daily after supper. 04/12/23   Clayborne Dana, NP      Allergies    Acetaminophen    Review of Systems   Review of Systems  Physical Exam Updated Vital Signs BP 119/82   Pulse 74   Temp 97.8 F (36.6 C)   Resp 16   SpO2 99%  Physical Exam Vitals and nursing note reviewed.  Constitutional:      General: He is not in  acute distress.    Appearance: He is not toxic-appearing.  HENT:     Head: Normocephalic.     Nose: Nose normal.     Mouth/Throat:     Mouth: Mucous membranes are moist.  Eyes:     Conjunctiva/sclera: Conjunctivae normal.  Cardiovascular:     Rate and Rhythm: Normal rate and regular rhythm.  Pulmonary:     Effort: Pulmonary effort is normal.  Abdominal:     General: Abdomen is flat. There is no distension.     Tenderness: There is no abdominal tenderness. There is no guarding or rebound.   Genitourinary:    Rectum: Normal. Guaiac result negative.  Musculoskeletal:        General: Normal range of motion.  Skin:    General: Skin is warm and dry.     Capillary Refill: Capillary refill takes less than 2 seconds.  Neurological:     General: No focal deficit present.     Mental Status: He is alert and oriented to person, place, and time.  Psychiatric:        Mood and Affect: Mood normal.        Behavior: Behavior normal.     ED Results / Procedures / Treatments   Labs (all labs ordered are listed, but only abnormal results are displayed) Labs Reviewed  CBC WITH DIFFERENTIAL/PLATELET - Abnormal; Notable for the following components:      Result Value   RBC 3.30 (*)    Hemoglobin 10.5 (*)    HCT 33.1 (*)    MCV 100.3 (*)    All other components within normal limits  BASIC METABOLIC PANEL - Abnormal; Notable for the following components:   Sodium 133 (*)    CO2 19 (*)    Glucose, Bld 105 (*)    BUN 28 (*)    Creatinine, Ser 1.67 (*)    GFR, Estimated 46 (*)    All other components within normal limits  PROTIME-INR  APTT  POC OCCULT BLOOD, ED  TYPE AND SCREEN    EKG EKG Interpretation Date/Time:  Wednesday April 27 2023 18:27:24 EST Ventricular Rate:  85 PR Interval:  162 QRS Duration:  75 QT Interval:  362 QTC Calculation: 431 R Axis:   43  Text Interpretation: Sinus rhythm Abnormal R-wave progression, early transition Confirmed by Estanislado Pandy [phone]) on 04/27/2023 7:36:56 PM  Radiology No results found.  Procedures Procedures    Medications Ordered in ED Medications - No data to display  ED Course/ Medical Decision Making/ A&P Clinical Course as of 04/27/23 1958  Wed Apr 27, 2023  1936 CBC with Differential(!) Anemia noted.  Appears to be at his baseline. [TY]  1936 APTT No significant coagulopathy [TY]  1936 Protime-INR No significant coagulopathy [TY]  1950 Basic metabolic panel(!) Basic metabolic panel appears to be at his  baseline. [TY]    Clinical Course User Index [TY] Coral Spikes, DO                                 Medical Decision Making Is a well-appearing 62 year old male presenting to the emergency department for possible blood in stool.  Is afebrile nontachycardic hemodynamically stable.  Physical exam with normal external anus, digital rectal exam with no overt masses.  Stool guaiac negative.  Labs ordered out of abundance of caution given patient's advanced age and history of prostate cancer.  However I suspect discolored stool  is secondary to a beet juice has been drinking for the past 2 days.  Discussed follow-up with primary doctor/GI and cessation of beet juice.  Stable for discharge at this time.  Amount and/or Complexity of Data Reviewed External Data Reviewed:     Details: Per chart review has history of prostate cancer.  Does not appear to have colonoscopy on file. Labs: ordered. Decision-making details documented in ED Course. Radiology:     Details: Considered imaging however vitals physical exam and negative Hemoccult.  Low suspicion for acute intra-abdominal pathology.  Risk Decision regarding hospitalization. Diagnosis or treatment significantly limited by social determinants of health. Risk Details: Poor health literacy.           Final Clinical Impression(s) / ED Diagnoses Final diagnoses:  None    Rx / DC Orders ED Discharge Orders     None         Coral Spikes, DO 04/27/23 1958

## 2023-05-02 ENCOUNTER — Ambulatory Visit (INDEPENDENT_AMBULATORY_CARE_PROVIDER_SITE_OTHER): Payer: Medicaid Other | Admitting: Neurology

## 2023-05-02 ENCOUNTER — Encounter: Payer: Self-pay | Admitting: Neurology

## 2023-05-02 VITALS — BP 125/84 | HR 84 | Ht 69.0 in | Wt 228.0 lb

## 2023-05-02 DIAGNOSIS — M5441 Lumbago with sciatica, right side: Secondary | ICD-10-CM

## 2023-05-02 DIAGNOSIS — G8929 Other chronic pain: Secondary | ICD-10-CM

## 2023-05-02 DIAGNOSIS — G629 Polyneuropathy, unspecified: Secondary | ICD-10-CM

## 2023-05-02 NOTE — Patient Instructions (Signed)
[facility] [phone] 

## 2023-05-02 NOTE — Progress Notes (Signed)
[facility] Neurology Division Clinic Note - Initial Visit   Date: 05/02/2023   Shaun Morgan MRN: [id number] DOB: 08-21-1960   Dear Hyman Hopes, NP:  Thank you for your kind referral of Shaun Morgan for consultation of bilateral feet pain. Although his history is well known to you, please allow [place] to reiterate it for the purpose of our medical record. The patient was accompanied to the clinic by self.    Shaun Morgan is a 62 y.o. right-handed male with hypertension, hepatatis C, hyperlipidemia, and prostate cancer presenting for evaluation of bilateral feet pain.   IMPRESSION/PLAN: Bilateral feet pain, predominately involving the soles of the feet.  Exam shows intact distal sensation with absent Achilles reflexes bilaterally.  ?S1 radiculopathy vs neuropathy.  MRI lumbar spine has been ordered and imaging center has attempted to contact the patient.  He was encouraged to return their call to schedule testing.  Based on these results, NCS/EMG of the legs can be performed.  In the meantime, I also suggest that he start PT for low back strengthening.  He will continue gabapentin 300mg  TID.   Further recommendations pending results.  ------------------------------------------------------------- History of present illness: For the past 3 months, he has noticed burning sensation involving the soles of the feet, which is constant.  No specific triggers or alleviating factors.  He denies weakness.  Balance is fair.  He walks unassisted. He endorses right sided low back pain which radiates down the back of the leg.  He sees orthopeadics for this and MRI lumbar spine has been ordered.    He was drinking 12-pack weekly for many years and stopped this a month ago.  He also has hepatitis C and will be starting therapy for this. No history of diabetes.    Out-side paper records, electronic medical record, and images have been reviewed where available and summarized  as:   Lab Results  Component Value Date   VITAMINB12 310 04/06/2023   Lab Results  Component Value Date   TSH 1.11 04/06/2023   No results found for: "ESRSEDRATE", "POCTSEDRATE"  Past Medical History:  Diagnosis Date   Asthma    Hepatitis C    per detention nurse   Hypertension    Neuropathy    Syphilis    per detention nurse   Wears partial dentures    upper/lower    Past Surgical History:  Procedure Laterality Date   CARPAL TUNNEL RELEASE     GOLD SEED IMPLANT N/A 09/07/2022   Procedure: GOLD SEED IMPLANT;  Surgeon: Despina Arias, MD;  Location: [facility];  Service: Urology;  Laterality: N/A;  30   SPACE OAR INSTILLATION N/A 09/07/2022   Procedure: SPACE OAR INSTILLATION;  Surgeon: Despina Arias, MD;  Location: [facility];  Service: Urology;  Laterality: N/A;     Medications:  Outpatient Encounter Medications as of 05/02/2023  Medication Sig   albuterol (PROVENTIL HFA;VENTOLIN HFA) 108 (90 BASE) MCG/ACT inhaler Inhale 2 puffs into the lungs every 6 (six) hours as needed for wheezing or shortness of breath.   bismuth subsalicylate (PEPTO-BISMOL) 262 MG chewable tablet Chew 2 tablets (524 mg total) by mouth as needed.   Calcium Carb-Cholecalciferol (CALCIUM + VITAMIN D3) 600-10 MG-MCG TABS Take 1 tablet by mouth 2 (two) times daily with a meal.   fluticasone-salmeterol (WIXELA INHUB) 100-50 MCG/ACT AEPB Inhale 1 puff into the lungs 2 (two) times daily.   gabapentin (NEURONTIN) 300 MG capsule Take 300 mg  by mouth 3 (three) times daily.   hydrocortisone cream 1 % Apply 1 Application topically 2 (two) times daily.   lidocaine (XYLOCAINE) 5 % ointment Apply 1 Application topically as needed.   losartan-hydrochlorothiazide (HYZAAR) 100-25 MG tablet Take 1 tablet by mouth daily.   simvastatin (ZOCOR) 10 MG tablet Take 1 tablet (10 mg total) by mouth daily.   tamsulosin (FLOMAX) 0.4 MG CAPS capsule Take 2 capsules (0.8 mg total) by mouth daily  after supper.   celecoxib (CELEBREX) 200 MG capsule Take 200 mg by mouth daily. (Patient not taking: Reported on 04/14/2023)   neomycin-bacitracin-polymyxin (NEOSPORIN) [phone] ointment Apply topically 4 (four) times daily. (Patient not taking: Reported on 04/14/2023)   phenazopyridine (AZO-STANDARD) 95 MG tablet Take 1 tablet (95 mg total) by mouth 3 (three) times daily as needed for pain. (Patient not taking: Reported on 04/14/2023)   polyethylene glycol (MIRALAX) 17 g packet Take 17 g by mouth daily. (Patient not taking: Reported on 04/14/2023)   Pramox-PE-Glycerin-Petrolatum (PREPARATION H) 1-0.25-14.4-15 % CREA Use as directed (Patient not taking: Reported on 04/14/2023)   pramoxine (PROCTOFOAM) 1 % foam Place 1 Application rectally 3 (three) times daily as needed for anal itching. (Patient not taking: Reported on 04/14/2023)   No facility-administered encounter medications on file as of 05/02/2023.    Allergies:  Allergies  Allergen Reactions   Acetaminophen Other (See Comments)    Hep C    Family History: Family History  Problem Relation Age of Onset   Epilepsy Mother    Heart disease Father     Social History: Social History   Tobacco Use   Smoking status: Former    Current packs/day: 0.00    Types: Cigarettes, E-cigarettes, Pipe    Quit date: 10/2021    Years since quitting: 1.5   Tobacco comments:    Patient incarcerated 10/2021, no nicotine products allowed since incarceration per detention center nurse        05/02/23- patient vapes non nocotine   Substance Use Topics   Alcohol use: Yes    Alcohol/week: 6.0 standard drinks of alcohol    Types: 6 Standard drinks or equivalent per week   Drug use: Never   Social History   Social History Narrative   ** Merged History Encounter **       Are you right handed or left handed? Right Handed    Are you currently employed ? 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 Yes   Who  lives with you?    What type of home do you live in: 1 story or 2 story? Lives in a one story home         Vital Signs:  BP 125/84   Pulse 84   Ht 5\' 9"  (1.753 m)   Wt 228 lb (103.4 kg)   SpO2 97%   BMI 33.67 kg/m   Neurological Exam: MENTAL STATUS including orientation to time, place, person, recent and remote memory, attention span and concentration, language, and fund of knowledge is normal.  Speech is not dysarthric.  CRANIAL NERVES: II:  No visual field defects.     III-IV-VI: Pupils equal round and reactive to light.  Normal conjugate, extra-ocular eye movements in all directions of gaze.  No nystagmus.  No ptosis.   V:  Normal facial sensation.    VII:  Normal facial symmetry and movements.   VIII:  Normal hearing and vestibular function.  IX-X:  Normal palatal movement.   XI:  Normal shoulder shrug and head rotation.   XII:  Normal tongue strength and range of motion, no deviation or fasciculation.  MOTOR:  No atrophy, fasciculations or abnormal movements.  No pronator drift.   Upper Extremity:  Right  Left  Deltoid  5/5   5/5   Biceps  5/5   5/5   Triceps  5/5   5/5   Wrist extensors  5/5   5/5   Wrist flexors  5/5   5/5   Finger extensors  5/5   5/5   Finger flexors  5/5   5/5   Dorsal interossei  5/5   5/5   Abductor pollicis  5/5   5/5   Tone (Ashworth scale)  0  0   Lower Extremity:  Right  Left  Hip flexors  5/5   5/5   Knee flexors  5/5   5/5   Knee extensors  5/5   5/5   Dorsiflexors  5/5   5/5   Plantarflexors  5/5   5/5   Toe extensors  5/5   5/5   Toe flexors  5/5   5/5   Tone (Ashworth scale)  0  0   MSRs:                                           Right        Left brachioradialis 2+  2+  biceps 2+  2+  triceps 2+  2+  patellar 2+  2+  ankle jerk 0  0  Hoffman no  no  plantar response down  down   SENSORY:  Normal and symmetric perception of light touch, pinprick, vibration, and temperature.  Romberg's sign absent.    COORDINATION/GAIT: Normal finger-to- nose-finger.  Intact rapid alternating movements bilaterally.  Gait narrow based and stable. He can stand on toes.  He is unable to stand on heels or perform stressed gait.    Thank you for allowing me to participate in patient's care.  If I can answer any additional questions, I would be pleased to do so.    Sincerely,    Yusif Gnau K. Allena Katz, DO

## 2023-05-03 ENCOUNTER — Other Ambulatory Visit: Payer: Self-pay

## 2023-05-03 ENCOUNTER — Telehealth: Payer: Self-pay

## 2023-05-03 ENCOUNTER — Other Ambulatory Visit (HOSPITAL_COMMUNITY): Payer: Self-pay

## 2023-05-03 MED ORDER — SOFOSBUVIR-VELPATASVIR 400-100 MG PO TABS
1.0000 | ORAL_TABLET | Freq: Every day | ORAL | 2 refills | Status: DC
  Filled 2023-05-03 (×2): qty 28, 28d supply, fill #0
  Filled 2023-05-26: qty 28, 28d supply, fill #1
  Filled 2023-06-15: qty 28, 28d supply, fill #2

## 2023-05-03 NOTE — Addendum Note (Signed)
Addended by: Blanchard Kelch on: 05/03/2023 01:59 PM   Modules accepted: Orders

## 2023-05-03 NOTE — Telephone Encounter (Signed)
-----   Message from [place] sent at 05/03/2023  1:50 PM EST ----- Please call Mr Shaun Morgan to let him know that his ultrasound looks very good - no concerning findings with his liver at all. This is all consistent with his labs.   We should be able to get his medication approval started and he will hear from our pharmacy team once we have an update - thank you!

## 2023-05-03 NOTE — Progress Notes (Signed)
Would monitor combination with simvastatin but otherwise looks good!

## 2023-05-03 NOTE — Telephone Encounter (Signed)
Spoke with patient regarding [place] results. No questions at this time. Understands pharmacy team will be calling him about medication once they hear back from insurance. Shaun Morgan, RMA

## 2023-05-03 NOTE — Progress Notes (Addendum)
Specialty Pharmacy Initial Fill Coordination Note  Shaun Morgan is a 62 y.o. male contacted today regarding initial fill of specialty medication(s) Sofosbuvir-Velpatasvir   Patient requested Courier to Provider Office   Delivery date: 05/05/23   Verified address: [address] [place] [address]   Medication will be filled on 05/04/23.   Patient is aware of 4.00 copayment.  Please put on AR/ACCT

## 2023-05-03 NOTE — Telephone Encounter (Signed)
[facility] Patient Advocate Encounter  Prior Authorization for Dorita Fray has been approved.    PA# [id number] Effective dates: 05/03/23 through 07/26/23  Patients co-pay is $4.00.   [facility] will continue to follow.  Clearance Coots, CPhT Specialty Pharmacy Patient [facility] for [facility] Phone: [phone] Fax:  [phone]

## 2023-05-03 NOTE — Progress Notes (Signed)
Genotype 1a, F1 fibrosis chronic hepatitis C infection.  Rx sent to [facility] for Epclusa x 12 weeks for treatment.   Shaun Morgan will you look behind me that to ensure no drug interactions?

## 2023-05-03 NOTE — Telephone Encounter (Signed)
[facility] Patient Advocate Encounter   Received notification from [facility] that prior authorization for Shaun Morgan is required.   PA submitted on 05/03/23 Key [id number] Status is pending    [facility] will continue to follow.   Clearance Coots, CPhT Specialty Pharmacy Patient [facility] for Infectious Disease Phone: [phone] Fax:  [phone]

## 2023-05-04 ENCOUNTER — Other Ambulatory Visit: Payer: Self-pay

## 2023-05-05 ENCOUNTER — Telehealth: Payer: Self-pay

## 2023-05-05 NOTE — Telephone Encounter (Signed)
[facility] Patient Advocate Encounter  Patient's medications EPCLUSA have been couriered to [facility] from [facility] and will be picked up at [facility].  Kae Heller, CPhT Specialty Pharmacy Patient [facility] for [facility] Phone: [phone] Fax:  [phone]

## 2023-05-09 ENCOUNTER — Ambulatory Visit: Payer: Medicaid Other | Admitting: Pharmacist

## 2023-05-09 ENCOUNTER — Ambulatory Visit: Payer: Medicaid Other | Admitting: Physical Therapy

## 2023-05-09 ENCOUNTER — Telehealth: Payer: Self-pay

## 2023-05-09 ENCOUNTER — Other Ambulatory Visit: Payer: Self-pay

## 2023-05-09 ENCOUNTER — Other Ambulatory Visit: Payer: Self-pay | Admitting: Pharmacist

## 2023-05-09 DIAGNOSIS — B182 Chronic viral hepatitis C: Secondary | ICD-10-CM

## 2023-05-09 NOTE — Therapy (Incomplete)
OUTPATIENT PHYSICAL THERAPY THORACOLUMBAR EVALUATION   Patient Name: Shaun Morgan MRN: [id number] DOB:Sep 12, 1960, 62 y.o., male Today's Date: 05/09/2023  END OF SESSION:   Past Medical History:  Diagnosis Date   Asthma    Hepatitis C    per detention nurse   Hypertension    Neuropathy    Syphilis    per detention nurse   Wears partial dentures    upper/lower   Past Surgical History:  Procedure Laterality Date   CARPAL TUNNEL RELEASE     GOLD SEED IMPLANT N/A 09/07/2022   Procedure: GOLD SEED IMPLANT;  Surgeon: Despina Arias, MD;  Location: [facility];  Service: Urology;  Laterality: N/A;  30   SPACE OAR INSTILLATION N/A 09/07/2022   Procedure: SPACE OAR INSTILLATION;  Surgeon: Despina Arias, MD;  Location: [facility];  Service: Urology;  Laterality: N/A;   Patient Active Problem List   Diagnosis Date Noted   Chronic left shoulder pain 04/06/2023   Neuropathy 04/06/2023   Chronic right hip pain 04/06/2023   Chronic right-sided low back pain with right-sided sciatica 04/06/2023   Asthma    Hypertension    BPH with obstruction/lower urinary tract symptoms 02/07/2023   Malignant neoplasm of prostate (HCC) 07/12/2022    PCP: ***  REFERRING PROVIDER: Glendale Chard, DO  REFERRING DIAG: G62.9 (ICD-10-CM) - Neuropathy M54.41,G89.29 (ICD-10-CM) - Chronic right-sided low back pain with right-sided sciatica  Rationale for Evaluation and Treatment: Rehabilitation  THERAPY DIAG:  No diagnosis found.  ONSET DATE: 05/02/2023 (referral date)  SUBJECTIVE:                                                                                                                                                                                           SUBJECTIVE STATEMENT: ***  PERTINENT HISTORY:  ***hypertension, hepatatis C, hyperlipidemia, and prostate cancer  PAIN:  Are you having pain? {OPRCPAIN:27236}  PRECAUTIONS: {Therapy  precautions:24002}  RED FLAGS: {PT Red Flags:29287}   WEIGHT BEARING RESTRICTIONS: {Yes ***/No:24003}  FALLS:  Has patient fallen in last 6 months? {fallsyesno:27318}  LIVING ENVIRONMENT: Lives with: {[facility] lives with:25569::"lives with their family"} Lives in: {Lives in:25570} Stairs: {opstairs:27293} Has following equipment at home: {Assistive devices:23999}  OCCUPATION: ***  PLOF: {PLOF:24004}  PATIENT GOALS: ***  NEXT MD VISIT: ***  OBJECTIVE:  Note: Objective measures were completed at Evaluation unless otherwise noted.  DIAGNOSTIC FINDINGS:  ***  PATIENT SURVEYS:  {rehab surveys:24030}  SCREENING FOR RED FLAGS: Bowel or bladder incontinence: {Yes/No:304960894} Spinal tumors: {Yes/No:304960894} Cauda equina syndrome: {Yes/No:304960894} Compression fracture: {Yes/No:304960894} Abdominal aneurysm: {Yes/No:304960894}  COGNITION: Overall cognitive status: {cognition:24006}  SENSATION: {sensation:27233}  MUSCLE LENGTH: Hamstrings: Right *** deg; Left *** deg Maisie Fus test: Right *** deg; Left *** deg  POSTURE: {posture:25561}  PALPATION: ***  LUMBAR ROM:   AROM eval  Flexion   Extension   Right lateral flexion   Left lateral flexion   Right rotation   Left rotation    (Blank rows = not tested)  LOWER EXTREMITY ROM:     {AROM/PROM:27142}  Right eval Left eval  Hip flexion    Hip extension    Hip abduction    Hip adduction    Hip internal rotation    Hip external rotation    Knee flexion    Knee extension    Ankle dorsiflexion    Ankle plantarflexion    Ankle inversion    Ankle eversion     (Blank rows = not tested)  LOWER EXTREMITY MMT:    MMT Right eval Left eval  Hip flexion    Hip extension    Hip abduction    Hip adduction    Hip internal rotation    Hip external rotation    Knee flexion    Knee extension    Ankle dorsiflexion    Ankle plantarflexion    Ankle inversion    Ankle eversion     (Blank rows = not  tested)  LUMBAR SPECIAL TESTS:  {lumbar special test:25242}  FUNCTIONAL TESTS:  {Functional tests:24029}  GAIT: Distance walked: *** Assistive device utilized: {Assistive devices:23999} Level of assistance: {Levels of assistance:24026} Comments: ***  TODAY'S TREATMENT:                                                                                                                              ***    PATIENT EDUCATION:  Education details: *** Person educated: {Person educated:25204} Education method: {Education Method:25205} Education comprehension: {Education Comprehension:25206}  HOME EXERCISE PROGRAM: ***  ASSESSMENT:  CLINICAL IMPRESSION: Patient is a *** year old *** referred to Neuro OPPT for***.   Pt's PMH is significant for: *** The following deficits were present during the exam: ***. Based on ***, pt is an incr risk for falls. Pt would benefit from skilled PT to address these impairments and functional limitations to maximize functional mobility independence   OBJECTIVE IMPAIRMENTS: {opptimpairments:25111}.   ACTIVITY LIMITATIONS: {activitylimitations:27494}  PARTICIPATION LIMITATIONS: {participationrestrictions:25113}  PERSONAL FACTORS: {Personal factors:25162} are also affecting patient's functional outcome.   REHAB POTENTIAL: {rehabpotential:25112}  CLINICAL DECISION MAKING: {clinical decision making:25114}  EVALUATION COMPLEXITY: {Evaluation complexity:25115}   GOALS: Goals reviewed with patient? {yes/no:20286}  SHORT TERM GOALS: Target date: ***  *** Baseline: Goal status: INITIAL  2.  *** Baseline:  Goal status: INITIAL  3.  *** Baseline:  Goal status: INITIAL  4.  *** Baseline:  Goal status: INITIAL  5.  *** Baseline:  Goal status: INITIAL  6.  *** Baseline:  Goal status: INITIAL  LONG TERM GOALS: Target date: ***  *** Baseline:  Goal status: INITIAL  2.  *** Baseline:  Goal status: INITIAL  3.  *** Baseline:   Goal status: INITIAL  4.  *** Baseline:  Goal status: INITIAL  5.  *** Baseline:  Goal status: INITIAL  6.  *** Baseline:  Goal status: INITIAL  PLAN:  PT FREQUENCY: {rehab frequency:25116}  PT DURATION: {rehab duration:25117}  PLANNED INTERVENTIONS: {rehab planned interventions:25118::"97110-Therapeutic exercises","97530- Therapeutic [id number]- Neuromuscular re-education","97535- Self JXBJ","47829- Manual therapy"}.  PLAN FOR NEXT SESSION: ***   Peter Congo, PT Peter Congo, PT, DPT, [facility] [address] [place], [place]  [address] Phone:  [phone] Fax:  [phone]  05/09/2023, 9:16 AM

## 2023-05-09 NOTE — Progress Notes (Signed)
Specialty Pharmacy Initiation Note   Shaun Morgan is a 62 y.o. male who will be followed by the specialty pharmacy service for RxSp Hepatitis C    Review of administration, indication, effectiveness, safety, potential side effects, storage/disposable, and missed dose instructions occurred today for patient's specialty medication(s) Sofosbuvir-Velpatasvir     Patient/Caregiver did not have any additional questions or concerns.   Patient's therapy is appropriate to: Initiate    Goals Addressed             This Visit's Progress    Achieve virologic cure as evidenced by SVR       Patient is initiating therapy. Patient will be evaluated at upcoming provider appointment to assess progress.      Comply with lab assessments       Patient is initiating therapy. Patient will adhere to provider and/or lab appointments.      Minimize and address adverse drug events/drug interactions       Patient is on track. Patient will be evaluated at upcoming provider appointment to assess progress.         Caitlyne Ingham L. Woods Gangemi, PharmD, BCIDP, AAHIVP, CPP Clinical Pharmacist Practitioner Infectious Diseases Clinical Pharmacist [facility] 05/09/2023, 1:37 PM

## 2023-05-09 NOTE — Progress Notes (Addendum)
Patient is approved to receive Epclusa x 12 weeks for chronic Hepatitis C infection. Counseled patient to take Epclusa daily with or without food. Encouraged patient not to miss any doses and explained how their chance of cure could go down with each dose missed. Counseled patient on what to do if dose is missed - if it is closer to the missed dose take immediately; if closer to next dose skip dose and take the next dose at the usual time.   Counseled patient on common side effects such as headache, fatigue, and nausea and that these normally decrease with time. I reviewed patient medications and found a drug interaction between Epclusa and simvastatin. He is on a low dose of 10 mg but states that he is already having myalgias. No history of heart attack or stroke. Will have him hold simvastatin while on Epclusa therapy. Discussed with patient that there are several drug interactions including acid suppressants. Instructed patient to call clinic if he wishes to start a new medication during course of therapy. Also advised patient to call if he experiences any side effects. Patient will follow-up with me in the pharmacy clinic on 06/02/23.  Adarrius Graeff L. Hilliary Jock, PharmD, BCIDP, AAHIVP, CPP Clinical Pharmacist Practitioner Infectious Diseases Clinical Pharmacist [facility] 05/09/2023, 1:28 PM

## 2023-05-09 NOTE — Telephone Encounter (Signed)
Medication was picked up at [facility] on  05/09/23 @ 11:15AM

## 2023-05-11 ENCOUNTER — Encounter: Payer: Self-pay | Admitting: Orthopedic Surgery

## 2023-05-11 ENCOUNTER — Ambulatory Visit: Payer: Medicaid Other | Admitting: Neurology

## 2023-05-12 ENCOUNTER — Ambulatory Visit: Payer: Medicaid Other | Admitting: Urology

## 2023-05-12 ENCOUNTER — Other Ambulatory Visit: Payer: Self-pay | Admitting: Urology

## 2023-05-12 ENCOUNTER — Encounter: Payer: Self-pay | Admitting: Urology

## 2023-05-12 VITALS — BP 116/77 | HR 80 | Ht 69.0 in | Wt 222.0 lb

## 2023-05-12 DIAGNOSIS — N401 Enlarged prostate with lower urinary tract symptoms: Secondary | ICD-10-CM

## 2023-05-12 DIAGNOSIS — N138 Other obstructive and reflux uropathy: Secondary | ICD-10-CM

## 2023-05-12 DIAGNOSIS — R351 Nocturia: Secondary | ICD-10-CM | POA: Diagnosis not present

## 2023-05-12 DIAGNOSIS — C61 Malignant neoplasm of prostate: Secondary | ICD-10-CM | POA: Diagnosis not present

## 2023-05-12 LAB — BLADDER SCAN AMB NON-IMAGING

## 2023-05-12 MED ORDER — SOLIFENACIN SUCCINATE 5 MG PO TABS: 5.0000 mg | ORAL_TABLET | Freq: Every evening | ORAL | 11 refills | Status: DC

## 2023-05-12 NOTE — Progress Notes (Signed)
Assessment: 1. Malignant neoplasm of prostate (HCC); high risk; s/p XRT, on ADT   2. BPH with obstruction/lower urinary tract symptoms   3. Nocturia     Plan: PSA today Continue tamsulosin 0.8 mg nightly Trial of solifenacin 5 mg nightly.  Rx sent. Recommend continuing ADT for total of 2 years due to high risk prostate cancer - next dose due after 08/08/23 Continue daily calcium and vitamin D supplementation. Return to office for Eligard after 08/08/23  Chief Complaint:  Chief Complaint  Patient presents with   Prostate Cancer    History of Present Illness:  Shaun Morgan is a 63 y.o. male who is seen for continued evaluation of high risk prostate cancer. He was found to have an elevated PSA of 27.7 and a abnormal prostate exam with firmness on the left.  He underwent a prostate ultrasound and biopsy on 04/02/2022 which demonstrated Gleason 4+4 = 8 adenocarcinoma in 10/12 cores.  Prostate volume measured 48.5 g.  PSMA PET scan from 05/20/2022 was negative for metastatic disease with uptake at the base of the prostate and probable bilateral seminal vesicle involvement. He elected to proceed with ADT and IMRT.  He received a 58-month Eligard on 07/21/2022. He underwent placement of fiducial markers and SpaceOAR on 09/07/2022.  He developed urinary retention postoperatively requiring Foley catheter placement in the emergency department. His Foley was removed on 10/06/2022. He completed his radiation treatments on 12/24/2022. He was given a 6 month Eligard on 02/09/23.  PSA data: 9/24 <0.1  At his visit in 9/24, he continued with lower urinary tract symptoms including hesitancy, primarily at night, sensation of incomplete emptying, decreased stream, and nocturia every hour.  His dysuria was improving.  No gross hematuria.  He continued on tamsulosin 0.8 mg nightly. IPSS = 22 QOL = 6/6.  He returns today for scheduled follow-up.  He continues with lower urinary tract symptoms,  primarily nocturia x 5.  He reports voiding with a good stream.  No daytime symptoms.  No dysuria or gross hematuria. IPSS = 17 today. He is tolerating androgen deprivation therapy well.  No significant hot flashes.  He has a good appetite.  No bone pain or weight loss.   Portions of the above documentation were copied from a prior visit for review purposes only.  Past Medical History:  Past Medical History:  Diagnosis Date   Asthma    Hepatitis C    per detention nurse   Hypertension    Neuropathy    Syphilis    per detention nurse   Wears partial dentures    upper/lower    Past Surgical History:  Past Surgical History:  Procedure Laterality Date   CARPAL TUNNEL RELEASE     GOLD SEED IMPLANT N/A 09/07/2022   Procedure: GOLD SEED IMPLANT;  Surgeon: Despina Arias, MD;  Location: [facility];  Service: Urology;  Laterality: N/A;  30   SPACE OAR INSTILLATION N/A 09/07/2022   Procedure: SPACE OAR INSTILLATION;  Surgeon: Despina Arias, MD;  Location: [facility];  Service: Urology;  Laterality: N/A;    Allergies:  Allergies  Allergen Reactions   Acetaminophen Other (See Comments)    Hep C    Family History:  Family History  Problem Relation Age of Onset   Epilepsy Mother    Heart disease Father     Social History:  Social History   Tobacco Use   Smoking status: Former    Current packs/day: 0.00  Types: Cigarettes, E-cigarettes, Pipe    Quit date: 10/2021    Years since quitting: 1.5   Tobacco comments:    Patient incarcerated 10/2021, no nicotine products allowed since incarceration per detention center nurse        05/02/23- patient vapes non nocotine   Substance Use Topics   Alcohol use: Yes    Alcohol/week: 6.0 standard drinks of alcohol    Types: 6 Standard drinks or equivalent per week   Drug use: Never    ROS: Constitutional:  Negative for fever, chills, weight loss CV: Negative for chest pain, previous MI,  hypertension Respiratory:  Negative for shortness of breath, wheezing, sleep apnea, frequent cough GI:  Negative for nausea, vomiting, bloody stool, GERD  Physical exam: BP 116/77   Pulse 80   Ht 5\' 9"  (1.753 m)   Wt 222 lb (100.7 kg)   BMI 32.78 kg/m  GENERAL APPEARANCE:  Well appearing, well developed, well nourished, NAD HEENT:  Atraumatic, normocephalic, oropharynx clear NECK:  Supple without lymphadenopathy or thyromegaly ABDOMEN:  Soft, non-tender, no masses EXTREMITIES:  Moves all extremities well, without clubbing, cyanosis, or edema NEUROLOGIC:  Alert and oriented x 3, normal gait, CN II-XII grossly intact MENTAL STATUS:  appropriate BACK:  Non-tender to palpation, No CVAT SKIN:  Warm, dry, and intact  Results: U/A:  negative  PVR: 25 ml

## 2023-05-13 ENCOUNTER — Ambulatory Visit: Payer: Medicaid Other | Attending: Neurology

## 2023-05-13 ENCOUNTER — Encounter: Payer: Self-pay | Admitting: Urology

## 2023-05-13 DIAGNOSIS — G8929 Other chronic pain: Secondary | ICD-10-CM | POA: Insufficient documentation

## 2023-05-13 DIAGNOSIS — R293 Abnormal posture: Secondary | ICD-10-CM | POA: Diagnosis present

## 2023-05-13 DIAGNOSIS — G629 Polyneuropathy, unspecified: Secondary | ICD-10-CM | POA: Diagnosis not present

## 2023-05-13 DIAGNOSIS — M5441 Lumbago with sciatica, right side: Secondary | ICD-10-CM | POA: Insufficient documentation

## 2023-05-13 DIAGNOSIS — M6281 Muscle weakness (generalized): Secondary | ICD-10-CM | POA: Insufficient documentation

## 2023-05-13 LAB — URINALYSIS, ROUTINE W REFLEX MICROSCOPIC
Bilirubin, UA: NEGATIVE
Glucose, UA: NEGATIVE
Ketones, UA: NEGATIVE
Leukocytes,UA: NEGATIVE
Nitrite, UA: NEGATIVE
Protein,UA: NEGATIVE
Specific Gravity, UA: 1.02 (ref 1.005–1.030)
Urobilinogen, Ur: 0.2 mg/dL (ref 0.2–1.0)
pH, UA: 5.5 (ref 5.0–7.5)

## 2023-05-13 LAB — PSA: Prostate Specific Ag, Serum: <0.1 ng/mL (ref 0.0–4.0)

## 2023-05-13 NOTE — Therapy (Signed)
OUTPATIENT PHYSICAL THERAPY THORACOLUMBAR EVALUATION   Patient Name: Shaun Morgan MRN: [id number] DOB:13-May-1961, 62 y.o., male Today's Date: 05/13/2023  END OF SESSION:  PT End of Session - 05/13/23 0739     Visit Number 1    Number of Visits 1   patient requesting to not complete PT at this time   Date for PT Re-Evaluation 05/14/23    Authorization Type [place] medicaid prepaid    PT Start Time 0805   patient in bathroom   PT Stop Time 0839    PT Time Calculation (min) 34 min    Activity Tolerance Patient tolerated treatment well;Patient limited by pain    Behavior During Therapy [facility] for tasks assessed/performed             Past Medical History:  Diagnosis Date   Asthma    Hepatitis C    per detention nurse   Hypertension    Neuropathy    Syphilis    per detention nurse   Wears partial dentures    upper/lower   Past Surgical History:  Procedure Laterality Date   CARPAL TUNNEL RELEASE     GOLD SEED IMPLANT N/A 09/07/2022   Procedure: GOLD SEED IMPLANT;  Surgeon: Despina Arias, MD;  [facility];  Service: Urology;  Laterality: N/A;  30   SPACE OAR INSTILLATION N/A 09/07/2022   Procedure: SPACE OAR INSTILLATION;  Surgeon: Despina Arias, MD;  Location: [facility];  Service: Urology;  Laterality: N/A;   Patient Active Problem List   Diagnosis Date Noted   Nocturia 05/12/2023   Chronic left shoulder pain 04/06/2023   Neuropathy 04/06/2023   Chronic right hip pain 04/06/2023   Chronic right-sided low back pain with right-sided sciatica 04/06/2023   Asthma    Hypertension    BPH with obstruction/lower urinary tract symptoms 02/07/2023   Malignant neoplasm of prostate (HCC) 07/12/2022    PCP: Hyman Hopes, NP  REFERRING PROVIDER: Glendale Chard, DO  REFERRING DIAG: G62.9 (ICD-10-CM) - Neuropathy M54.41,G89.29 (ICD-10-CM) - Chronic right-sided low back pain with right-sided sciatica  Rationale for Evaluation and  Treatment: Rehabilitation  THERAPY DIAG:  Muscle weakness (generalized) - Plan: PT plan of care cert/re-cert  Abnormal posture - Plan: PT plan of care cert/re-cert  ONSET DATE: 05/02/2023 (referral date)  SUBJECTIVE:                                                                                                                                                                                           SUBJECTIVE STATEMENT: Patient arrives to clinic alone, no AD. When asked what brings  him to physical therapy, he responds "I don't know." Patient reporting that his back "healed itself" and he no longer has pain. Does report L shoulder pain for which he says he's going to have sx. With prompting, patient then reporting neuropathy in R LE. This started in the end of 2023. He reports that prostate CA dx occurred before the back pain. Reports that gabapentin helps "30-40%."   PERTINENT HISTORY:  hypertension, hepatatis C, hyperlipidemia, and prostate cancer  PAIN:  Are you having pain? Yes: NPRS scale: 4/10 Pain location: R LE Pain description: "constant burning" Aggravating factors: eating something spicy, walking Relieving factors: reports no relieving factors  PRECAUTIONS: Fall  RED FLAGS: Bowel or bladder incontinence: No   WEIGHT BEARING RESTRICTIONS: No  FALLS:  Has patient fallen in last 6 months? Yes. Number of falls 2, fell down stairs  LIVING ENVIRONMENT: Lives with: lives with their family and is homeless   OCCUPATION: truck driver  PLOF: Independent  PATIENT GOALS: "take away some of this pain"  OBJECTIVE:  Note: Objective measures were completed at Evaluation unless otherwise noted.  DIAGNOSTIC FINDINGS:  Lumbar xray from 11/15 still not resulted at time of eval  Lumbar MRI scheduled for 12/16   PATIENT SURVEYS:  Modified Oswestry 60%   SCREENING FOR RED FLAGS: Bowel or bladder incontinence: No Spinal tumors: awaiting imaging Cauda equina syndrome:  No Compression fracture: awaiting imaging Abdominal aneurysm: No  COGNITION: Overall cognitive status: Within functional limits for tasks assessed     SENSATION: Light touch: Impaired  and R LE  POSTURE: rounded shoulders, forward head, increased thoracic kyphosis, posterior pelvic tilt, and flexed trunk   LUMBAR ROM:   AROM eval  Flexion   Extension   Right lateral flexion   Left lateral flexion   Right rotation   Left rotation    (Blank rows = not tested)  LOWER EXTREMITY MMT:    MMT Right eval Left eval  Hip flexion 3- (P!) 4  Hip extension    Hip abduction 4 4  Hip adduction 4 4  Hip internal rotation    Hip external rotation    Knee flexion 4- 4+  Knee extension 3+ 4+  Ankle dorsiflexion 5 5  Ankle plantarflexion    Ankle inversion    Ankle eversion     (Blank rows = not tested)  LUMBAR SPECIAL TESTS:  Slump test: Negative, Quadrant test: Negative, and Stork standing: Positive  FUNCTIONAL TESTS:   [facility] PT Assessment - 05/13/23 0001       Standardized Balance Assessment   Standardized Balance Assessment 10 meter walk test    10 Meter Walk .61m/s              GAIT: Distance walked: clinic Assistive device utilized: None Level of assistance: Complete Independence Comments: slight trendelenburg  TODAY'S TREATMENT:  N/a eval     PATIENT EDUCATION:  Education details: PT POC, exam findings Person educated: Patient Education method: Explanation Education comprehension: verbalized understanding and needs further education  ASSESSMENT:  CLINICAL IMPRESSION: Patient is a 62 year old male referred to Neuro OPPT for neuropathy and chronic R LBP with sciatica. Slump test and quadrant testing was negative on evaluation, but stork-stance was positive. He denies radicular symptoms in distribution of the sciatic nerve, but does  demonstrate profoundly weak proximal muscles given patients stature. Discussed benefit of PT to assist with strengthening, limit risk for falls, minimize pain and improve overall QOL. His Oswestry score indicated a 60% disability related to his back pain. However, patient declining to participate in PT at this time as he wants to pursue shoulder surgery.       CLINICAL DECISION MAKING: Stable/uncomplicated  EVALUATION COMPLEXITY: Low   GOALS: Not indicated as patient does not wish to participate in PT at this time  PLAN:  PT FREQUENCY: one time visit  PT DURATION: other: 1x visit   PLANNED INTERVENTIONS: 97164- PT Re-evaluation, 97110-Therapeutic exercises, 97530- Therapeutic activity, 97112- Neuromuscular re-education, 97535- Self Care, 13086- Manual therapy, [phone]- Gait training, [phone]- Orthotic Fit/training, Patient/Family education, Balance training, Stair training, Taping, Dry Needling, Joint mobilization, Joint manipulation, Spinal manipulation, Spinal mobilization, Vestibular training, Visual/preceptual remediation/compensation, DME instructions, Cryotherapy, and Moist heat.   Westley Foots, PT Westley Foots, PT, DPT, CBIS 05/13/2023, 9:39 AM   For all possible CPT codes, reference the Planned Interventions line above.     Check all conditions that are expected to impact treatment: {Conditions expected to impact treatment:Musculoskeletal disorders, Neurological condition and/or seizures, Ongoing dialysis or cancer treatment, and Social determinants of health   If treatment provided at initial evaluation, no treatment charged due to lack of authorization.

## 2023-05-16 ENCOUNTER — Ambulatory Visit
Admission: RE | Admit: 2023-05-16 | Discharge: 2023-05-16 | Disposition: A | Discharge status: Home / Self Care | Payer: Medicaid Other | Source: Ambulatory Visit | Attending: Orthopedic Surgery | Admitting: Orthopedic Surgery

## 2023-05-16 DIAGNOSIS — G8929 Other chronic pain: Secondary | ICD-10-CM

## 2023-05-19 ENCOUNTER — Ambulatory Visit: Payer: Medicaid Other | Admitting: Infectious Diseases

## 2023-05-24 ENCOUNTER — Other Ambulatory Visit: Payer: Self-pay

## 2023-05-24 ENCOUNTER — Ambulatory Visit (INDEPENDENT_AMBULATORY_CARE_PROVIDER_SITE_OTHER): Payer: Medicaid Other | Admitting: Family Medicine

## 2023-05-24 VITALS — BP 130/79 | HR 80 | Ht 69.0 in | Wt 230.0 lb

## 2023-05-24 DIAGNOSIS — B351 Tinea unguium: Secondary | ICD-10-CM

## 2023-05-24 DIAGNOSIS — E782 Mixed hyperlipidemia: Secondary | ICD-10-CM

## 2023-05-24 DIAGNOSIS — B353 Tinea pedis: Secondary | ICD-10-CM

## 2023-05-24 LAB — COMPREHENSIVE METABOLIC PANEL
ALT: 16 U/L (ref 0–53)
AST: 21 U/L (ref 0–37)
Albumin: 4.1 g/dL (ref 3.5–5.2)
Alkaline Phosphatase: 87 U/L (ref 39–117)
BUN: 24 mg/dL — ABNORMAL HIGH (ref 6–23)
CO2: 27 meq/L (ref 19–32)
Calcium: 9.4 mg/dL (ref 8.4–10.5)
Chloride: 104 meq/L (ref 96–112)
Creatinine, Ser: 1.26 mg/dL (ref 0.40–1.50)
GFR: 61.41 mL/min (ref >60.00)
Glucose, Bld: 99 mg/dL (ref 70–99)
Potassium: 4.2 meq/L (ref 3.5–5.1)
Sodium: 137 meq/L (ref 135–145)
Total Bilirubin: 0.5 mg/dL (ref 0.2–1.2)
Total Protein: 7.5 g/dL (ref 6.0–8.3)

## 2023-05-24 LAB — LIPID PANEL
Cholesterol: 227 mg/dL — ABNORMAL HIGH (ref 0–200)
HDL: 75 mg/dL (ref >39.00)
LDL Cholesterol: 138 mg/dL — ABNORMAL HIGH (ref 0–99)
NonHDL: 152.44
Total CHOL/HDL Ratio: 3
Triglycerides: 73 mg/dL (ref 0.0–149.0)
VLDL: 14.6 mg/dL (ref 0.0–40.0)

## 2023-05-24 MED ORDER — CLOTRIMAZOLE-BETAMETHASONE 1-0.05 % EX CREA: 1.0000 | TOPICAL_CREAM | Freq: Every day | CUTANEOUS | 0 refills | Status: DC

## 2023-05-24 NOTE — Progress Notes (Signed)
Acute Office Visit  Subjective:     Patient ID: Shaun Morgan, male    DOB: 1960-09-14, 62 y.o.   MRN: [id number]  Chief Complaint  Patient presents with   Foot Problem    HPI Patient is in today for foot problems.   Discussed the use of AI scribe software for clinical note transcription with the patient, who gave verbal consent to proceed.  History of Present Illness   The patient, with a history of liver disease and hepatitis C, presents with a chronic foot condition that has been ongoing for over thirty years. They describe it as a fungal infection that started small and has since grown, affecting primarily the right foot. The condition manifests as flaky, dry skin on top of the toes and between them, causing the skin to break and bleed when the patient attempts to peel it off. The toenails on the right foot are also affected by the fungus, leading to discomfort when wearing shoes.  The patient has previously been prescribed oral antifungal medication by a nurse practitioner, which provided temporary relief but the condition returned once the medication was discontinued. They have also tried over-the-counter antifungal creams such as Lotrimin, which soothes the pain and temporarily clears up the dryness, but the condition inevitably returns.  The patient has been diligent in changing socks daily, but this has not prevented the recurrence of the fungal infection. They express a desire to see a foot specialist for further management of this chronic condition.  In addition to the foot condition, the patient is currently on medication for liver disease (Epclusa) prescribed by an infectious disease specialist for the management of hepatitis C. They have an upcoming appointment with the specialist in January. The patient also mentions a change in diet and exercise habits, since they had to discontinue the statin.           ROS All review of systems negative except what is  listed in the HPI      Objective:    BP 130/79   Pulse 80   Ht 5\' 9"  (1.753 m)   Wt 230 lb (104.3 kg)   SpO2 100%   BMI 33.97 kg/m    Physical Exam Vitals reviewed.  Constitutional:      Appearance: Normal appearance.  Feet:     Right foot:     Skin integrity: Dry skin present.     Toenail Condition: Fungal disease present.    Left foot:     Skin integrity: Dry skin present.     Toenail Condition: Fungal disease present.    Comments: Bilateral feet with dry skin, flaking rash, right worse than left Skin:    General: Skin is warm and dry.  Neurological:     Mental Status: He is alert and oriented to person, place, and time.     Results for orders placed or performed in visit on 05/24/23  Comprehensive metabolic panel  Result Value Ref Range   Sodium 137 135 - 145 mEq/L   Potassium 4.2 3.5 - 5.1 mEq/L   Chloride 104 96 - 112 mEq/L   CO2 27 19 - 32 mEq/L   Glucose, Bld 99 70 - 99 mg/dL   BUN 24 (H) 6 - 23 mg/dL   Creatinine, Ser 4.09 0.40 - 1.50 mg/dL   Total Bilirubin 0.5 0.2 - 1.2 mg/dL   Alkaline Phosphatase 87 39 - 117 U/L   AST 21 0 - 37 U/L   ALT 16 0 -  53 U/L   Total Protein 7.5 6.0 - 8.3 g/dL   Albumin 4.1 3.5 - 5.2 g/dL   GFR 16.10 >96.04 mL/min   Calcium 9.4 8.4 - 10.5 mg/dL  Lipid panel  Result Value Ref Range   Cholesterol 227 (H) 0 - 200 mg/dL   Triglycerides 54.0 0.0 - 149.0 mg/dL   HDL 98.11 >91.47 mg/dL   VLDL 82.9 0.0 - 56.2 mg/dL   LDL Cholesterol 130 (H) 0 - 99 mg/dL   Total CHOL/HDL Ratio 3    NonHDL 152.44         Assessment & Plan:   Problem List Items Addressed This Visit       Active Problems   Mixed hyperlipidemia   Previously on Simvastatin, but discontinued due to potential interaction with Epclusa. Patient reports dietary changes to manage cholesterol levels. -Check cholesterol levels today.       Relevant Orders   Comprehensive metabolic panel (Completed)   Lipid panel (Completed)   Other Visit Diagnoses        Onychomycosis    -  Primary   Relevant Medications   clotrimazole-betamethasone (LOTRISONE) cream   Other Relevant Orders   Ambulatory referral to Podiatry     Tinea pedis of both feet       Relevant Medications   clotrimazole-betamethasone (LOTRISONE) cream   Other Relevant Orders   Ambulatory referral to Podiatry         Tinea Pedis and Onychomycosis Chronic, severe fungal infection of the feet and toenails, predominantly on the right foot. Previous oral antifungal treatment had temporary effect. Topical antifungal creams provide temporary relief. -Refer to podiatry for further management and nail care. -Prescribe Lotrisone cream to help with the itching and dryness.     Meds ordered this encounter  Medications   clotrimazole-betamethasone (LOTRISONE) cream    Sig: Apply 1 Application topically daily.    Dispense:  30 g    Refill:  0    Supervising Provider:   Danise Edge A [[id number]]    Return if symptoms worsen or fail to improve.  Clayborne Dana, NP

## 2023-05-25 ENCOUNTER — Other Ambulatory Visit: Payer: Self-pay

## 2023-05-25 ENCOUNTER — Encounter: Payer: Self-pay | Admitting: Family Medicine

## 2023-05-25 ENCOUNTER — Emergency Department (HOSPITAL_COMMUNITY): Payer: Medicaid Other

## 2023-05-25 ENCOUNTER — Emergency Department (HOSPITAL_COMMUNITY)
Admission: EM | Admit: 2023-05-25 | Discharge: 2023-05-25 | Disposition: A | Discharge status: Home / Self Care | Payer: Medicaid Other | Attending: Emergency Medicine | Admitting: Emergency Medicine

## 2023-05-25 ENCOUNTER — Encounter (HOSPITAL_COMMUNITY): Payer: Self-pay

## 2023-05-25 DIAGNOSIS — Z8546 Personal history of malignant neoplasm of prostate: Secondary | ICD-10-CM | POA: Diagnosis not present

## 2023-05-25 DIAGNOSIS — E782 Mixed hyperlipidemia: Secondary | ICD-10-CM | POA: Insufficient documentation

## 2023-05-25 DIAGNOSIS — Z1152 Encounter for screening for COVID-19: Secondary | ICD-10-CM | POA: Insufficient documentation

## 2023-05-25 DIAGNOSIS — R599 Enlarged lymph nodes, unspecified: Secondary | ICD-10-CM | POA: Insufficient documentation

## 2023-05-25 LAB — CBC WITH DIFFERENTIAL/PLATELET
Abs Immature Granulocytes: 0.01 10*3/uL (ref 0.00–0.07)
Basophils Absolute: 0 10*3/uL (ref 0.0–0.1)
Basophils Relative: 1 %
Eosinophils Absolute: 0 10*3/uL (ref 0.0–0.5)
Eosinophils Relative: 1 %
HCT: 34.3 % — ABNORMAL LOW (ref 39.0–52.0)
Hemoglobin: 11.1 g/dL — ABNORMAL LOW (ref 13.0–17.0)
Immature Granulocytes: 0 %
Lymphocytes Relative: 20 %
Lymphs Abs: 0.8 10*3/uL (ref 0.7–4.0)
MCH: 32.6 pg (ref 26.0–34.0)
MCHC: 32.4 g/dL (ref 30.0–36.0)
MCV: 100.6 fL — ABNORMAL HIGH (ref 80.0–100.0)
Monocytes Absolute: 0.5 10*3/uL (ref 0.1–1.0)
Monocytes Relative: 13 %
Neutro Abs: 2.5 10*3/uL (ref 1.7–7.7)
Neutrophils Relative %: 65 %
Platelets: 146 10*3/uL — ABNORMAL LOW (ref 150–400)
RBC: 3.41 MIL/uL — ABNORMAL LOW (ref 4.22–5.81)
RDW: 11.9 % (ref 11.5–15.5)
WBC: 3.8 10*3/uL — ABNORMAL LOW (ref 4.0–10.5)
nRBC: 0 % (ref 0.0–0.2)

## 2023-05-25 LAB — I-STAT CHEM 8, ED
BUN: 25 mg/dL — ABNORMAL HIGH (ref 8–23)
Calcium, Ion: 1.21 mmol/L (ref 1.15–1.40)
Chloride: 106 mmol/L (ref 98–111)
Creatinine, Ser: 1.6 mg/dL — ABNORMAL HIGH (ref 0.61–1.24)
Glucose, Bld: 93 mg/dL (ref 70–99)
HCT: 32 % — ABNORMAL LOW (ref 39.0–52.0)
Hemoglobin: 10.9 g/dL — ABNORMAL LOW (ref 13.0–17.0)
Potassium: 4.1 mmol/L (ref 3.5–5.1)
Sodium: 141 mmol/L (ref 135–145)
TCO2: 26 mmol/L (ref 22–32)

## 2023-05-25 LAB — BASIC METABOLIC PANEL
Anion gap: 7 (ref 5–15)
BUN: 21 mg/dL (ref 8–23)
CO2: 24 mmol/L (ref 22–32)
Calcium: 9.4 mg/dL (ref 8.9–10.3)
Chloride: 106 mmol/L (ref 98–111)
Creatinine, Ser: 1.38 mg/dL — ABNORMAL HIGH (ref 0.61–1.24)
GFR, Estimated: 58 mL/min — ABNORMAL LOW (ref >60)
Glucose, Bld: 95 mg/dL (ref 70–99)
Potassium: 4 mmol/L (ref 3.5–5.1)
Sodium: 137 mmol/L (ref 135–145)

## 2023-05-25 LAB — RESP PANEL BY RT-PCR (RSV, FLU A&B, COVID)  RVPGX2
Influenza A by PCR: NEGATIVE
Influenza B by PCR: NEGATIVE
Resp Syncytial Virus by PCR: NEGATIVE
SARS Coronavirus 2 by RT PCR: NEGATIVE

## 2023-05-25 MED ORDER — IOHEXOL 350 MG/ML SOLN
60.0000 mL | Freq: Once | INTRAVENOUS | Status: AC | PRN
  Administered 2023-05-25: 60 mL via INTRAVENOUS

## 2023-05-25 NOTE — Assessment & Plan Note (Signed)
Previously on Simvastatin, but discontinued due to potential interaction with Epclusa. Patient reports dietary changes to manage cholesterol levels. -Check cholesterol levels today.

## 2023-05-25 NOTE — ED Notes (Signed)
Pt d/c home per EDP order. Discharge summary reviewed, pt verbalizes understanding. NAD.

## 2023-05-25 NOTE — Discharge Instructions (Signed)
Your CT scan showed a couple of swollen lymph nodes.  There are no abnormal findings.  You may have a developing infection like a virus.  There is no specific treatment for this and should resolve on its own.  Contact a health care provider if: You have lymph nodes that: Are still swollen after 2 weeks. Have gotten bigger all of a sudden or the swelling spreads. Are red, painful, or hard. Fluid leaks from the skin near a swollen lymph node. You get a fever, chills, or night sweats. You feel tired. You have a sore throat. Your abdomen hurts. You lose weight without trying.

## 2023-05-25 NOTE — ED Triage Notes (Signed)
Pt came to ED for swollen lymph node on right side, C/O sinus infection. SX started yesterday. Denies fevers.

## 2023-05-25 NOTE — ED Notes (Signed)
Pt provided "ED HAPPY MEAL" 

## 2023-05-25 NOTE — ED Provider Notes (Signed)
EMERGENCY DEPARTMENT AT [facility] Provider Note   CSN: [id number] Arrival date & time: 05/25/23  1237     History  Chief Complaint  Patient presents with   URI    Shaun Morgan is a 62 y.o. male with a past medical history of prostate cancer is currently under active treatment who presents to the emergency department chief complaint of lymphadenopathy.  Patient reports noticing lymph node swollen under the angle of the right mandible starting 24 hours ago.  He denies sore throat jaw pain dental pain pain with swallowing or ear pain.  He does have hot and cold flashes secondary to the testosterone suppressant medication he is currently taking.  He does not have shaking chills or sweats.   URI      Home Medications Prior to Admission medications   Medication Sig Start Date End Date Taking? Authorizing Provider  albuterol (PROVENTIL HFA;VENTOLIN HFA) 108 (90 BASE) MCG/ACT inhaler Inhale 2 puffs into the lungs every 6 (six) hours as needed for wheezing or shortness of breath.    [provider]  bismuth subsalicylate (PEPTO-BISMOL) 262 MG chewable tablet Chew 2 tablets (524 mg total) by mouth as needed. 12/27/22   Bruning, Ashlyn, PA-C  Calcium Carb-Cholecalciferol (CALCIUM + VITAMIN D3) 600-10 MG-MCG TABS Take 1 tablet by mouth 2 (two) times daily with a meal. 12/27/22   Bruning, Ashlyn, PA-C  clotrimazole-betamethasone (LOTRISONE) cream Apply 1 Application topically daily. 05/24/23   Clayborne Dana, NP  fluticasone-salmeterol (WIXELA INHUB) 100-50 MCG/ACT AEPB Inhale 1 puff into the lungs 2 (two) times daily. 04/06/23   Clayborne Dana, NP  gabapentin (NEURONTIN) 300 MG capsule Take 300 mg by mouth 3 (three) times daily.    [provider]  hydrocortisone cream 1 % Apply 1 Application topically 2 (two) times daily. 12/27/22   Bruning, Ashlyn, PA-C  lidocaine (XYLOCAINE) 5 % ointment Apply 1 Application topically as needed. 01/07/23   Linwood Dibbles, MD  losartan-hydrochlorothiazide (HYZAAR) 100-25 MG tablet Take 1 tablet by mouth daily.    [provider]  Sofosbuvir-Velpatasvir (EPCLUSA) 400-100 MG TABS Take 1 tablet by mouth daily. 05/03/23   Blanchard Kelch, NP  solifenacin (VESICARE) 5 MG tablet Take 1 tablet (5 mg total) by mouth at bedtime. 05/12/23   Stoneking, Danford Bad., MD  tamsulosin (FLOMAX) 0.4 MG CAPS capsule Take 2 capsules (0.8 mg total) by mouth daily after supper. 04/12/23   Clayborne Dana, NP      Allergies    Acetaminophen    Review of Systems   Review of Systems  Physical Exam Updated Vital Signs BP 126/85   Pulse 87   Temp (!) 97.4 F (36.3 C)   Resp 18   Ht 5\' 9"  (1.753 m)   Wt 103.9 kg   SpO2 99%   BMI 33.82 kg/m  Physical Exam Vitals and nursing note reviewed.  Constitutional:      General: He is not in acute distress.    Appearance: He is well-developed. He is not diaphoretic.  HENT:     Head: Normocephalic and atraumatic.  Eyes:     General: No scleral icterus.    Conjunctiva/sclera: Conjunctivae normal.  Neck:     Comments: Palpable nodules noted under the angle of the right mandible.  No erythema of the oropharynx, no swelling of the parotid glands, no obvious palpable hypoglossal swelling or blockage of any salivary glands. Cardiovascular:     Rate and Rhythm: Normal rate  and regular rhythm.     Heart sounds: Normal heart sounds.  Pulmonary:     Effort: Pulmonary effort is normal. No respiratory distress.     Breath sounds: Normal breath sounds.  Abdominal:     Palpations: Abdomen is soft.     Tenderness: There is no abdominal tenderness.  Musculoskeletal:     Cervical back: Normal range of motion and neck supple.  Skin:    General: Skin is warm and dry.  Neurological:     Mental Status: He is alert.  Psychiatric:        Behavior: Behavior normal.     ED Results / Procedures / Treatments   Labs (all labs ordered are listed, but only abnormal results are  displayed) Labs Reviewed  CBC WITH DIFFERENTIAL/PLATELET - Abnormal; Notable for the following components:      Result Value   WBC 3.8 (*)    RBC 3.41 (*)    Hemoglobin 11.1 (*)    HCT 34.3 (*)    MCV 100.6 (*)    Platelets 146 (*)    All other components within normal limits  BASIC METABOLIC PANEL - Abnormal; Notable for the following components:   Creatinine, Ser 1.38 (*)    GFR, Estimated 58 (*)    All other components within normal limits  I-STAT CHEM 8, ED - Abnormal; Notable for the following components:   BUN 25 (*)    Creatinine, Ser 1.60 (*)    Hemoglobin 10.9 (*)    HCT 32.0 (*)    All other components within normal limits  RESP PANEL BY RT-PCR (RSV, FLU A&B, COVID)  [id number]    EKG None  Radiology CT Soft Tissue Neck W Contrast Result Date: 05/25/2023 CLINICAL DATA:  Provided history: Thyroid nodule. Additional history provided: The patient reports a swollen lymph node on right side, sinus infection. EXAM: CT NECK WITH CONTRAST TECHNIQUE: Multidetector CT imaging of the neck was performed using the standard protocol following the bolus administration of intravenous contrast. RADIATION DOSE REDUCTION: This exam was performed according to the departmental dose-optimization program which includes automated exposure control, adjustment of the mA and/or kV according to patient size and/or use of iterative reconstruction technique. CONTRAST:  60mL OMNIPAQUE IOHEXOL 350 MG/ML SOLN COMPARISON:  None. FINDINGS: Pharynx and larynx: No appreciable swelling or mass within the oral cavity, pharynx or larynx. Salivary glands: No inflammation, mass, or stone. Thyroid: Unremarkable. Lymph nodes: No pathologically enlarged lymph nodes are identified within the neck. Surgical clips within the left submandibular region. Vascular: The major vascular structures of the neck are patent. Atherosclerotic plaque within proximal right ICA, and within the intracranial internal carotid arteries. Limited  intracranial: No evidence of an acute intracranial abnormality within the field of view. Visualized orbits: No orbital mass or acute orbital finding. Chronic medially displaced fracture deformity of the left lamina papyracea. Mastoids and visualized paranasal sinuses: Portions of the frontal sinuses are excluded from the field of view superiorly. Trace mucosal thickening within the bilateral maxillary sinuses. No significant mastoid effusion. Skeleton: No consolidation within the imaged lung apices. Cervical spondylosis. No acute fracture or aggressive osseous lesion. Upper chest: No consolidation within the imaged lung apices. IMPRESSION: 1. No pathologically enlarged lymph nodes are identified within the neck. 2. Unremarkable CT appearance of the thyroid gland. No appreciable thyroid nodule. 3. Minimal mucosal thickening within the bilateral maxillary sinuses. 4. Chronic medially displaced fracture deformity of the left lamina papyracea. 5. Cervical spondylosis. Electronically Signed   By: Ronaldo Miyamoto  Renette Butters D.O.   On: 05/25/2023 18:48    Procedures Procedures    Medications Ordered in ED Medications  iohexol (OMNIPAQUE) 350 MG/ML injection 60 mL (60 mLs Intravenous Contrast Given 05/25/23 1810)    ED Course/ Medical Decision Making/ A&P Clinical Course as of 05/25/23 1903  Wed May 25, 2023  1612 Patient here for evaluation of mandibular lymphadenopathy.  He has a past medical history of active prostate cancer.  Will obtain imaging of the face and neck. [AH]  1859 CBC with Differential(!) [AH]    Clinical Course User Index [AH] Arthor Captain, PA-C                                 Medical Decision Making Patient here with lymphadenopathy.  Differential diagnosis includes localized infection Thyroglossal duct cyst, metastases. After review of all data points patient appears to have no acute findings.  I ordered labs which shows some mild thrombocytopenia and some mild renal insufficiency  chronic. Remainder of patient's labs are unremarkable.  Patient's respiratory panel is also negative for acute finding.  I visualized interpreted CT soft tissue of the neck which shows no acute findings.  Patient appears otherwise appropriate for discharge.  Symptomatic treatment.  Return precautions given.  Amount and/or Complexity of Data Reviewed Labs: ordered. Decision-making details documented in ED Course. Radiology: ordered.  Risk Prescription drug management.           Final Clinical Impression(s) / ED Diagnoses Final diagnoses:  Swollen lymph nodes    Rx / DC Orders ED Discharge Orders     None         Arthor Captain, PA-C 05/25/23 1903    Lonell Grandchild, MD 05/25/23 2225

## 2023-05-26 ENCOUNTER — Other Ambulatory Visit: Payer: Self-pay

## 2023-05-26 ENCOUNTER — Other Ambulatory Visit (HOSPITAL_COMMUNITY): Payer: Self-pay

## 2023-05-26 NOTE — Progress Notes (Signed)
Specialty Pharmacy Refill Coordination Note  Shaun Morgan is a 62 y.o. male assessed today regarding refills of clinic administered specialty medication(s) Sofosbuvir-Velpatasvir   Clinic requested Courier to Provider Office   Delivery date: 05/30/23   Verified address: [address] [place] [address]   Medication will be filled on 05/27/23.

## 2023-05-26 NOTE — Progress Notes (Signed)
Metabolic panel and kidney function are stable Cholesterol is still elevated and overall risk is elevated. Per infectious disease note, we are holding off on simvastatin while you are doing Epclusa therapy. Not smoking and controlling your blood pressure, getting exercise, and eating a heart healthy diet are very important.   The 10-year ASCVD risk score (Arnett DK, et al., 2019) is: 20.6%   Values used to calculate the score:     Age: 62 years     Sex: Male     Is Non-Hispanic African American: Yes     Diabetic: No     Tobacco smoker: Yes     Systolic Blood Pressure: 126 mmHg     Is BP treated: Yes     HDL Cholesterol: 75 mg/dL     Total Cholesterol: 227 mg/dL

## 2023-05-27 ENCOUNTER — Other Ambulatory Visit: Payer: Self-pay

## 2023-05-30 ENCOUNTER — Telehealth: Payer: Self-pay

## 2023-05-30 ENCOUNTER — Other Ambulatory Visit: Payer: Self-pay

## 2023-05-30 NOTE — Telephone Encounter (Signed)
 [facility] Patient Advocate Encounter  Patient's medications EPCLUSA have been couriered to [facility] from [facility] and will be picked up at [facility].  Kae Heller, CPhT Specialty Pharmacy Patient [facility] Phone: [phone] Fax:  [phone]

## 2023-05-31 NOTE — Progress Notes (Signed)
 HPI: Shaun Morgan is a 62 y.o. male who presents to the [facility] pharmacy clinic for Hepatitis C follow-up.  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   BISMUTH  SUBSALICYLATE (PEPTO-BISMOL) 262 MG CHEWABLE TABLET    Chew 2 tablets (524 mg total) by mouth as needed.   CALCIUM  CARB-CHOLECALCIFEROL  (CALCIUM  + VITAMIN D3) 600-10 MG-MCG TABS    Take 1 tablet by mouth 2 (two) times daily with a meal.   CLOTRIMAZOLE -BETAMETHASONE  (LOTRISONE ) CREAM    Apply 1 Application topically daily.   FLUTICASONE -SALMETEROL (WIXELA INHUB) 100-50 MCG/ACT AEPB    Inhale 1 puff into the lungs 2 (two) times daily.   GABAPENTIN  (NEURONTIN ) 300 MG CAPSULE    Take 300 mg by mouth 3 (three) times daily.   HYDROCORTISONE  CREAM 1 %    Apply 1 Application topically 2 (two) times daily.   LIDOCAINE  (XYLOCAINE ) 5 % OINTMENT    Apply 1 Application topically as needed.   LOSARTAN -HYDROCHLOROTHIAZIDE  (HYZAAR ) 100-25 MG TABLET    Take 1 tablet by mouth daily.   SOFOSBUVIR -VELPATASVIR  (EPCLUSA ) 400-100 MG TABS    Take 1 tablet by mouth daily.   SOLIFENACIN  (VESICARE ) 5 MG TABLET    Take 1 tablet (5 mg total) by mouth at bedtime.    TAMSULOSIN  (FLOMAX ) 0.4 MG CAPS CAPSULE    Take 2 capsules (0.8 mg total) by mouth daily after supper.  Modified Medications   No medications on file  Discontinued Medications   No medications on file    Allergies: Allergies  Allergen Reactions   Acetaminophen  Other (See Comments)    Hep C    Past Medical History: Past Medical History:  Diagnosis Date   Asthma    Hepatitis C    per detention nurse   Hypertension    Neuropathy    Syphilis    per detention nurse   Wears partial dentures    upper/lower    Social History: Social History   Socioeconomic History   Marital status: Single    Spouse name: Not on file   Number of children: Not on file   Years of education: Not on file   Highest education level: Not on file  Occupational History   Not on file  Tobacco Use   Smoking status: Former    Current packs/day: 0.00    Types: Cigarettes, E-cigarettes, Pipe    Quit date: 10/2021    Years since quitting: 1.5   Smokeless tobacco: Not on file   Tobacco comments:    Patient incarcerated 10/2021, no nicotine  products allowed since incarceration per detention center nurse        05/02/23- patient vapes non nocotine   Substance and  Sexual Activity   Alcohol use: Yes    Alcohol/week: 6.0 standard drinks of alcohol    Types: 6 Standard drinks or equivalent per week   Drug use: Never   Sexual activity: Not Currently  Other Topics Concern   Not on file  Social History Narrative   ** Merged History Encounter **       Are you right handed or left handed? Right Handed    Are you currently employed ? 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 Yes   Who lives with you?    What type of home do you live in: 1 story or 2 story? Lives in a one story home        Social Drivers of Health   Financial Resource Strain: Medium Risk (07/18/2018)   Received from [facility] visits prior to 07/31/2022., [facility]  [facility] visits prior to 07/31/2022.   Overall Financial Resource Strain (CARDIA)    Difficulty of Paying Living Expenses: Somewhat hard  Food Insecurity: No Food Insecurity (12/16/2022)   Hunger Vital Sign    Worried About Running Out of Food in the Last Year: Never true    Ran Out of Food in the Last Year: Never true  Transportation Needs: Unmet Transportation Needs (12/16/2022)   PRAPARE - Administrator, Civil Service (Medical): No    Lack of Transportation (Non-Medical): Yes  Physical Activity: Not on file  Stress: Not on file  Social Connections: Not on file    Labs: Hepatitis C Lab Results  Component Value Date   HCVGENOTYPE 1a 04/14/2023   HEPCAB REACTIVE (A) 04/06/2023   HCVRNAPCRQN 6,180,000 (H) 04/06/2023   FIBROSTAGE F0-F1 04/14/2023   Hepatitis B Lab Results  Component Value Date   HEPBSAB NON-REACTIVE 04/14/2023   HEPBSAG NON-REACTIVE 04/14/2023   HEPBCAB NON-REACTIVE 04/14/2023   Hepatitis A No results found for: HAV HIV No results found for: HIV Lab Results  Component Value Date   CREATININE 1.60 (H) 05/25/2023   CREATININE 1.38 (H) 05/25/2023   CREATININE 1.26 05/24/2023   CREATININE 1.67 (H) 04/27/2023   CREATININE 1.61 (H) 04/06/2023   Lab Results  Component Value Date   AST 21 05/24/2023   AST 50 (H) 04/06/2023   AST 52 (H) 01/07/2023   ALT 16 05/24/2023   ALT 45 04/14/2023   ALT 53 04/06/2023   INR 1.1 04/27/2023   INR 1.1 04/14/2023    Assessment: Shaun Morgan presents to clinic today for HCV follow-up. He has been taking Epclusa  for 4 weeks and has not missed any doses.  Provided him with his second month in clinic today; states he has ~3 tablets left which is appropriate. Has been tolerating the medication well. Will check HCV RNA today and follow-up in 8 weeks. States he is continuing to hold simvastatin  while taking Epclusa .   Accepts HBV vaccination today; will receive second dose in one month. Declines flu and COVID  vaccines today. States he received the Tdap booster ~7 years ago and that he would be interested in Shingrix in the future. Will check HAV immunity today as well.    Plan: - Check HCV RNA and HAV antibody  - Continue Epclusa  x 8 weeks - Administer Heplisav 1/2 vaccine  - Follow up on 2/5 for 2/2 Heplisav - Follow up with me on 3/5 at HCV end of treatment   Alan Geralds, PharmD, CPP, BCIDP,  AAHIVP Clinical Pharmacist Practitioner Infectious Diseases Clinical Pharmacist [facility] 05/31/2023, 3:31 PM

## 2023-06-02 ENCOUNTER — Ambulatory Visit (INDEPENDENT_AMBULATORY_CARE_PROVIDER_SITE_OTHER): Payer: Medicaid Other | Admitting: Pharmacist

## 2023-06-02 ENCOUNTER — Other Ambulatory Visit: Payer: Self-pay

## 2023-06-02 DIAGNOSIS — B182 Chronic viral hepatitis C: Secondary | ICD-10-CM | POA: Diagnosis not present

## 2023-06-02 DIAGNOSIS — Z23 Encounter for immunization: Secondary | ICD-10-CM

## 2023-06-03 ENCOUNTER — Ambulatory Visit (INDEPENDENT_AMBULATORY_CARE_PROVIDER_SITE_OTHER): Payer: Medicaid Other | Admitting: Podiatry

## 2023-06-03 ENCOUNTER — Encounter: Payer: Self-pay | Admitting: Podiatry

## 2023-06-03 DIAGNOSIS — B351 Tinea unguium: Secondary | ICD-10-CM

## 2023-06-03 MED ORDER — KETOCONAZOLE 2 % EX CREA: 1.0000 | TOPICAL_CREAM | Freq: Every day | CUTANEOUS | 0 refills | Status: AC

## 2023-06-03 MED ORDER — CICLOPIROX 8 % EX SOLN: Freq: Every day | CUTANEOUS | 2 refills | Status: DC

## 2023-06-03 NOTE — Patient Instructions (Addendum)
 You can use an antiperspirant spray in your feet to help with the moisture.  Athlete's Foot Athlete's foot (tinea pedis) is a fungal infection of the skin on your feet. It often occurs on the skin that is between or underneath the toes. It can also occur on the soles of your feet. The infection can spread from person to person (is contagious). It can also spread when a person's bare feet come in contact with the fungus on shower floors or on items such as shoes. What are the causes? This condition is caused by a fungus that grows in warm, moist places. You can get athlete's foot by sharing shoes, shower stalls, towels, and wet floors with someone who is infected. Not washing your feet or changing your socks often enough can also lead to athlete's foot. What increases the risk? This condition is more likely to develop in: Men. People who have a weak body defense system (immune system). People who have diabetes. People who use public showers, such as at a gym. People who wear heavy-duty shoes, such as youth worker. Seasons with warm, humid weather. What are the signs or symptoms? Symptoms of this condition include: Itchy areas between your toes or on the soles of your feet. White, flaky, or scaly areas between your toes or on the soles of your feet. Very itchy small blisters between your toes or on the soles of your feet. Small cuts in your skin. These cuts can become infected. Thick or discolored toenails. How is this diagnosed? This condition may be diagnosed with a physical exam and a review of your medical history. Your health care provider may also take a skin or toenail sample to examine under a microscope. How is this treated? This condition is treated with antifungal medicines. These may be applied as powders, ointments, or creams. In severe cases, an oral antifungal medicine may be given. Follow these instructions at home: Medicines Apply or take over-the-counter and  prescription medicines only as told by your health care provider. Apply your antifungal medicine as told by your health care provider. Do not stop using the antifungal even if your condition improves. Foot care Do not scratch your feet. Keep your feet dry: Wear cotton or wool socks. Change your socks every day or if they become wet. Wear shoes that allow air to flow, such as sandals or canvas tennis shoes. Wash and dry your feet, including the area between your toes. Also, wash and dry your feet: Every day or as told by your health care provider. After exercising. General instructions Do not let others use towels, shoes, nail clippers, or other personal items that touch your feet. Protect your feet by wearing sandals in wet areas, such as locker rooms and shared showers. Keep all follow-up visits. This is important. If you have diabetes, keep your blood sugar under control. Contact a health care provider if: You have a fever. You have swelling, soreness, warmth, or redness in your foot. Your feet are not getting better with treatment. Your symptoms get worse. You have new symptoms. You have severe pain. Summary Athlete's foot (tinea pedis) is a fungal infection of the skin on your feet. It often occurs on skin that is between or underneath the toes. This condition is caused by a fungus that grows in warm, moist places. Symptoms include white, flaky, or scaly areas between your toes or on the soles of your feet. This condition is treated with antifungal medicines. Keep your feet clean. Always  dry them thoroughly. This information is not intended to replace advice given to you by your health care provider. Make sure you discuss any questions you have with your health care provider. Document Revised: 09/07/2020 Document Reviewed: 09/07/2020 Elsevier Patient Education  2024 [employer].

## 2023-06-03 NOTE — Progress Notes (Signed)
 Subjective:   Patient ID: Shaun [place], male   DOB: 63 y.o.   MRN: [id number]   HPI Chief Complaint  Patient presents with   Nail Problem    RM#14 new pt-onychomycosis and tinea pedis bilateral, R foot and R toenails are worse. Patient states it has been 30 years dealing with these issues.Past treatment aren't effective and symptoms return.    5 male presents the office with above concerns.  He states he started getting fungus about 30 years ago after he went to a dance party in [place].  He states afterwards he thought he developed a ringworm and his feet started to itch.  After that she started getting cracking, peeling skin in between his toes.  He previously did see another provider and he was on oral medication for short-term but then the fungus would come right back.  He states he burned it.  No open lesions.  He also has not dry skin, calluses.   Review of Systems  All other systems reviewed and are negative.  Past Medical History:  Diagnosis Date   Asthma    Hepatitis C    per detention nurse   Hypertension    Neuropathy    Syphilis    per detention nurse   Wears partial dentures    upper/lower    Past Surgical History:  Procedure Laterality Date   CARPAL TUNNEL RELEASE     GOLD SEED IMPLANT N/A 09/07/2022   Procedure: GOLD SEED IMPLANT;  Surgeon: Shaun Arlyss CROME, MD;  Location: [facility];  Service: Urology;  Laterality: N/A;  30   SPACE OAR INSTILLATION N/A 09/07/2022   Procedure: SPACE OAR INSTILLATION;  Surgeon: Shaun Arlyss CROME, MD;  Location: [facility];  Service: Urology;  Laterality: N/A;     Current Outpatient Medications:    albuterol  (PROVENTIL  HFA;VENTOLIN  HFA) 108 (90 BASE) MCG/ACT inhaler, Inhale 2 puffs into the lungs every 6 (six) hours as needed for wheezing or shortness of breath., Disp: , Rfl:    bismuth  subsalicylate (PEPTO-BISMOL) 262 MG chewable tablet, Chew 2 tablets (524 mg total) by mouth as  needed., Disp: 30 tablet, Rfl: 1   Calcium  Carb-Cholecalciferol  (CALCIUM  + VITAMIN D3) 600-10 MG-MCG TABS, Take 1 tablet by mouth 2 (two) times daily with a meal., Disp: 60 tablet, Rfl: 2   ciclopirox  (PENLAC ) 8 % solution, Apply topically at bedtime. Apply over nail and surrounding skin. Apply daily over previous coat. After seven (7) days, may remove with alcohol and continue cycle., Disp: 6.6 mL, Rfl: 2   clotrimazole -betamethasone  (LOTRISONE ) cream, Apply 1 Application topically daily., Disp: 30 g, Rfl: 0   fluticasone -salmeterol (WIXELA INHUB) 100-50 MCG/ACT AEPB, Inhale 1 puff into the lungs 2 (two) times daily., Disp: 1 each, Rfl: 3   gabapentin  (NEURONTIN ) 300 MG capsule, Take 300 mg by mouth 3 (three) times daily., Disp: , Rfl:    hydrocortisone  cream 1 %, Apply 1 Application topically 2 (two) times daily., Disp: 30 g, Rfl: 0   ketoconazole  (NIZORAL ) 2 % cream, Apply 1 Application topically daily., Disp: 60 g, Rfl: 0   lidocaine  (XYLOCAINE ) 5 % ointment, Apply 1 Application topically as needed., Disp: 35.44 g, Rfl: 0   losartan -hydrochlorothiazide  (HYZAAR ) 100-25 MG tablet, Take 1 tablet by mouth daily., Disp: , Rfl:    Sofosbuvir -Velpatasvir  (EPCLUSA ) 400-100 MG TABS, Take 1 tablet by mouth daily., Disp: 28 tablet, Rfl: 2   solifenacin  (VESICARE ) 5 MG tablet, Take 1 tablet (5 mg  total) by mouth at bedtime., Disp: 30 tablet, Rfl: 11   tamsulosin  (FLOMAX ) 0.4 MG CAPS capsule, Take 2 capsules (0.8 mg total) by mouth daily after supper., Disp: 180 capsule, Rfl: 1  Allergies  Allergen Reactions   Acetaminophen  Other (See Comments)    Hep C          Objective:  Physical Exam  General: AAO x3, NAD  Dermatological: Dry, peeling skin interdigitally.  There is dry skin present about both feet.  Hyperkeratotic lesion on the medial aspect of bilateral hallux as well as the posterior aspect left heel.  Nails are hypertrophic, dystrophic with brown discoloration and some subungual debris is  present.  Vascular: Dorsalis Pedis artery and Posterior Tibial artery pedal pulses are 2/4 bilateral with immedate capillary fill time.  There is no pain with calf compression, swelling, warmth, erythema.   Neruologic: Grossly intact via light touch bilateral.  Musculoskeletal: No other areas of discomfort.     Assessment:   63 year old male with tinea pedis, onychomycosis     Plan:  -Treatment options discussed including all alternatives, risks, and complications -Etiology of symptoms were discussed -I did debride the nails and sent in for culture to [facility] -Will try ketoconazole  for the skin as well as Penlac  to the toenails.  Like to hold off on oral antifungal medication given his medical history.  -We discussed moisturizer of the calluses.  As a courtesy debridement any complications or bleeding. -Discussed changing shoes and socks regularly.  He has not been wearing socks with his shoes.  Discussed moisture wicking socks and natural fiber socks.  He can use an antiperspirant spray as well to help with moisture.  No follow-ups on file.  Donnice JONELLE Fees DPM

## 2023-06-04 LAB — HEPATITIS C RNA QUANTITATIVE
HCV Quantitative Log: 2.37 {Log_IU}/mL — ABNORMAL HIGH
HCV RNA, PCR, QN: 233 [IU]/mL — ABNORMAL HIGH

## 2023-06-04 LAB — HEPATITIS A ANTIBODY, TOTAL: Hepatitis A AB,Total: NONREACTIVE

## 2023-06-06 ENCOUNTER — Emergency Department (HOSPITAL_COMMUNITY)
Admission: EM | Admit: 2023-06-06 | Discharge: 2023-06-07 | Disposition: A | Discharge status: Home / Self Care | Payer: Medicaid Other | Attending: Emergency Medicine | Admitting: Emergency Medicine

## 2023-06-06 ENCOUNTER — Other Ambulatory Visit: Payer: Self-pay | Admitting: Family Medicine

## 2023-06-06 ENCOUNTER — Other Ambulatory Visit: Payer: Self-pay

## 2023-06-06 ENCOUNTER — Encounter: Payer: Self-pay | Admitting: Family Medicine

## 2023-06-06 ENCOUNTER — Encounter (HOSPITAL_COMMUNITY): Payer: Self-pay | Admitting: Emergency Medicine

## 2023-06-06 ENCOUNTER — Emergency Department (HOSPITAL_COMMUNITY): Payer: Medicaid Other

## 2023-06-06 DIAGNOSIS — R079 Chest pain, unspecified: Secondary | ICD-10-CM

## 2023-06-06 DIAGNOSIS — Z8546 Personal history of malignant neoplasm of prostate: Secondary | ICD-10-CM | POA: Diagnosis not present

## 2023-06-06 DIAGNOSIS — R0789 Other chest pain: Secondary | ICD-10-CM | POA: Diagnosis not present

## 2023-06-06 DIAGNOSIS — G629 Polyneuropathy, unspecified: Secondary | ICD-10-CM

## 2023-06-06 DIAGNOSIS — Z79899 Other long term (current) drug therapy: Secondary | ICD-10-CM | POA: Insufficient documentation

## 2023-06-06 DIAGNOSIS — I10 Essential (primary) hypertension: Secondary | ICD-10-CM | POA: Insufficient documentation

## 2023-06-06 LAB — CBC
HCT: 32.8 % — ABNORMAL LOW (ref 39.0–52.0)
Hemoglobin: 10.7 g/dL — ABNORMAL LOW (ref 13.0–17.0)
MCH: 32.7 pg (ref 26.0–34.0)
MCHC: 32.6 g/dL (ref 30.0–36.0)
MCV: 100.3 fL — ABNORMAL HIGH (ref 80.0–100.0)
Platelets: 184 10*3/uL (ref 150–400)
RBC: 3.27 MIL/uL — ABNORMAL LOW (ref 4.22–5.81)
RDW: 12.2 % (ref 11.5–15.5)
WBC: 5.3 10*3/uL (ref 4.0–10.5)
nRBC: 0 % (ref 0.0–0.2)

## 2023-06-06 LAB — BASIC METABOLIC PANEL
Anion gap: 8 (ref 5–15)
BUN: 27 mg/dL — ABNORMAL HIGH (ref 8–23)
CO2: 24 mmol/L (ref 22–32)
Calcium: 9.7 mg/dL (ref 8.9–10.3)
Chloride: 108 mmol/L (ref 98–111)
Creatinine, Ser: 1.54 mg/dL — ABNORMAL HIGH (ref 0.61–1.24)
GFR, Estimated: 51 mL/min — ABNORMAL LOW (ref >60)
Glucose, Bld: 111 mg/dL — ABNORMAL HIGH (ref 70–99)
Potassium: 4.2 mmol/L (ref 3.5–5.1)
Sodium: 140 mmol/L (ref 135–145)

## 2023-06-06 LAB — TROPONIN I (HIGH SENSITIVITY): Troponin I (High Sensitivity): 4 ng/L (ref <18)

## 2023-06-06 MED ORDER — GABAPENTIN 300 MG PO CAPS
300.0000 mg | ORAL_CAPSULE | Freq: Three times a day (TID) | ORAL | 3 refills | Status: AC
Start: 2023-06-06 — End: ?

## 2023-06-06 MED ORDER — GABAPENTIN 300 MG PO CAPS: 300.0000 mg | ORAL_CAPSULE | Freq: Three times a day (TID) | ORAL | 3 refills | Status: DC

## 2023-06-06 NOTE — Progress Notes (Signed)
 FYI HCV RNA significantly decreased after one month of treatment but still not undetectable. Patient states he only missed 2-3 doses of his Epclusa.

## 2023-06-06 NOTE — ED Triage Notes (Signed)
 Pt here for L sided chest tightness that is worse with inspiration since 6pm, reports feeling like he needs to have a BM but is not able to go. States "if I could have a BM I think I would feel better". Denies abd pain and NV.

## 2023-06-07 ENCOUNTER — Emergency Department (HOSPITAL_COMMUNITY): Payer: Medicaid Other

## 2023-06-07 DIAGNOSIS — R0789 Other chest pain: Secondary | ICD-10-CM | POA: Diagnosis not present

## 2023-06-07 LAB — HEPATIC FUNCTION PANEL
ALT: 14 U/L (ref 0–44)
AST: 21 U/L (ref 15–41)
Albumin: 3.3 g/dL — ABNORMAL LOW (ref 3.5–5.0)
Alkaline Phosphatase: 79 U/L (ref 38–126)
Bilirubin, Direct: 0.1 mg/dL (ref 0.0–0.2)
Indirect Bilirubin: 0.4 mg/dL (ref 0.3–0.9)
Total Bilirubin: 0.5 mg/dL (ref 0.0–1.2)
Total Protein: 6.8 g/dL (ref 6.5–8.1)

## 2023-06-07 LAB — MAGNESIUM: Magnesium: 1.9 mg/dL (ref 1.7–2.4)

## 2023-06-07 LAB — TROPONIN I (HIGH SENSITIVITY): Troponin I (High Sensitivity): 4 ng/L (ref <18)

## 2023-06-07 LAB — LIPASE, BLOOD: Lipase: 32 U/L (ref 11–51)

## 2023-06-07 MED ORDER — IOHEXOL 350 MG/ML SOLN
65.0000 mL | Freq: Once | INTRAVENOUS | Status: AC | PRN
  Administered 2023-06-07: 65 mL via INTRAVENOUS

## 2023-06-07 MED ORDER — LACTATED RINGERS IV BOLUS
1000.0000 mL | Freq: Once | INTRAVENOUS | Status: AC
  Administered 2023-06-07: 1000 mL via INTRAVENOUS

## 2023-06-07 NOTE — Discharge Instructions (Signed)
 Test results today are reassuring.  A referral was sent for cardiology follow-up.  You should hear from their office in the next couple days to schedule that appointment.  If you do not hear from them, call the telephone number below.  Return to the emergency department for any new or worsening symptoms of concern.

## 2023-06-07 NOTE — ED Provider Notes (Signed)
 [place] Provider Note   CSN: [id number] Arrival date & time: 06/06/23  2111     History  Chief Complaint  Patient presents with   Chest Pain    Sh[place] is a 63 y.o. male.   Chest Pain Patient presents for chest pain.  Medical history includes prostate cancer, HTN, neuropathy, HLD, BPH, chronic pain.  He does not currently see a cardiologist.  Starting today, he had onset of left-sided chest pain.  Pain is worse with deep inspiration.  He felt some bilateral chest tightness.  He denies any significant shortness of breath.  In ED triage, he mentioned wanting to have a bowel movement.  He states that he has been having regular bowel movements, most recently which was today.  He denies any current abdominal discomfort.     Home Medications Prior to Admission medications   Medication Sig Start Date End Date Taking? Authorizing Provider  albuterol  (PROVENTIL  HFA;VENTOLIN  HFA) 108 (90 BASE) MCG/ACT inhaler Inhale 2 puffs into the lungs every 6 (six) hours as needed for wheezing or shortness of breath.    [provider]  bismuth  subsalicylate (PEPTO-BISMOL) 262 MG chewable tablet Chew 2 tablets (524 mg total) by mouth as needed. 12/27/22   Bruning, Ashlyn, PA-C  Calcium  Carb-Cholecalciferol  (CALCIUM  + VITAMIN D3) 600-10 MG-MCG TABS Take 1 tablet by mouth 2 (two) times daily with a meal. 12/27/22   Bruning, Ashlyn, PA-C  ciclopirox  (PENLAC ) 8 % solution Apply topically at bedtime. Apply over nail and surrounding skin. Apply daily over previous coat. After seven (7) days, may remove with alcohol and continue cycle. 06/03/23   Gershon Donnice SAUNDERS, DPM  clotrimazole -betamethasone  (LOTRISONE ) cream Apply 1 Application topically daily. 05/24/23   Almarie Waddell NOVAK, NP  fluticasone -salmeterol (WIXELA INHUB) 100-50 MCG/ACT AEPB Inhale 1 puff into the lungs 2 (two) times daily. 04/06/23   Almarie Waddell NOVAK, NP  gabapentin  (NEURONTIN ) 300 MG  capsule Take 1 capsule (300 mg total) by mouth 3 (three) times daily. 06/06/23   Almarie Waddell NOVAK, NP  hydrocortisone  cream 1 % Apply 1 Application topically 2 (two) times daily. 12/27/22   Bruning, Ashlyn, PA-C  ketoconazole  (NIZORAL ) 2 % cream Apply 1 Application topically daily. 06/03/23   Gershon Donnice SAUNDERS, DPM  lidocaine  (XYLOCAINE ) 5 % ointment Apply 1 Application topically as needed. 01/07/23   Randol Simmonds, MD  losartan -hydrochlorothiazide  (HYZAAR ) 100-25 MG tablet Take 1 tablet by mouth daily.    [provider]  Sofosbuvir -Velpatasvir  (EPCLUSA ) 400-100 MG TABS Take 1 tablet by mouth daily. 05/03/23   Melvenia Corean SAILOR, NP  solifenacin  (VESICARE ) 5 MG tablet Take 1 tablet (5 mg total) by mouth at bedtime. 05/12/23   Stoneking, Adine PARAS., MD  tamsulosin  (FLOMAX ) 0.4 MG CAPS capsule Take 2 capsules (0.8 mg total) by mouth daily after supper. 04/12/23   Almarie Waddell NOVAK, NP      Allergies    Acetaminophen     Review of Systems   Review of Systems  Respiratory:  Positive for chest tightness.   Cardiovascular:  Positive for chest pain.  All other systems reviewed and are negative.   Physical Exam Updated Vital Signs BP 124/85 (BP Location: Left Arm)   Pulse (!) 102   Temp 98.6 F (37 C) (Oral)   Resp 18   Ht 5' 9 (1.753 m)   Wt 104.3 kg   SpO2 96%   BMI 33.97 kg/m  Physical Exam Vitals and nursing note reviewed.  Constitutional:      General: He is not in acute distress.    Appearance: He is well-developed. He is not ill-appearing, toxic-appearing or diaphoretic.  HENT:     Head: Normocephalic and atraumatic.  Eyes:     Conjunctiva/sclera: Conjunctivae normal.  Cardiovascular:     Rate and Rhythm: Normal rate and regular rhythm.     Heart sounds: No murmur heard. Pulmonary:     Effort: Pulmonary effort is normal. No respiratory distress.     Breath sounds: Normal breath sounds. No decreased breath sounds, wheezing, rhonchi or rales.  Chest:     Chest wall: Tenderness  present.  Abdominal:     Palpations: Abdomen is soft.     Tenderness: There is no abdominal tenderness.  Musculoskeletal:        General: No swelling. Normal range of motion.     Cervical back: Normal range of motion and neck supple.  Skin:    General: Skin is warm and dry.     Coloration: Skin is not cyanotic or pale.  Neurological:     General: No focal deficit present.     Mental Status: He is alert and oriented to person, place, and time.  Psychiatric:        Mood and Affect: Mood normal.        Behavior: Behavior normal.     ED Results / Procedures / Treatments   Labs (all labs ordered are listed, but only abnormal results are displayed) Labs Reviewed  BASIC METABOLIC PANEL - Abnormal; Notable for the following components:      Result Value   Glucose, Bld 111 (*)    BUN 27 (*)    Creatinine, Ser 1.54 (*)    GFR, Estimated 51 (*)    All other components within normal limits  CBC - Abnormal; Notable for the following components:   RBC 3.27 (*)    Hemoglobin 10.7 (*)    HCT 32.8 (*)    MCV 100.3 (*)    All other components within normal limits  HEPATIC FUNCTION PANEL - Abnormal; Notable for the following components:   Albumin 3.3 (*)    All other components within normal limits  MAGNESIUM  LIPASE, BLOOD  TROPONIN I (HIGH SENSITIVITY)  TROPONIN I (HIGH SENSITIVITY)    EKG EKG Interpretation Date/Time:  Monday June 06 2023 21:20:51 EST Ventricular Rate:  112 PR Interval:  126 QRS Duration:  70 QT Interval:  334 QTC Calculation: 455 R Axis:   50  Text Interpretation: Sinus tachycardia Confirmed by Melvenia Motto ([id number]) on 06/07/2023 12:28:10 AM  Radiology CT Angio Chest PE W and/or Wo Contrast Result Date: 06/07/2023 CLINICAL DATA:  Left-sided chest tightness worse with inspiration. EXAM: CT ANGIOGRAPHY CHEST WITH CONTRAST TECHNIQUE: Multidetector CT imaging of the chest was performed using the standard protocol during bolus administration of intravenous  contrast. Multiplanar CT image reconstructions and MIPs were obtained to evaluate the vascular anatomy. RADIATION DOSE REDUCTION: This exam was performed according to the departmental dose-optimization program which includes automated exposure control, adjustment of the mA and/or kV according to patient size and/or use of iterative reconstruction technique. CONTRAST:  65mL OMNIPAQUE  IOHEXOL  350 MG/ML SOLN COMPARISON:  Radiographs 06/06/2023 and PET/CT 05/20/2022 FINDINGS: Cardiovascular: Negative for acute pulmonary embolism. No pericardial effusion. Mediastinum/Nodes: Trachea and esophagus are unremarkable. No thoracic adenopathy. Lungs/Pleura: No focal consolidation, pleural effusion, or pneumothorax. Upper Abdomen: No acute abnormality. Musculoskeletal: No acute fracture. Review of the MIP images confirms the above findings. IMPRESSION:  1. Negative for acute pulmonary embolism. 2. No acute abnormality in the chest. Electronically Signed   By: Norman Gatlin M.D.   On: 06/07/2023 02:02   DG Chest 2 View Result Date: 06/06/2023 CLINICAL DATA:  Chest pain onset 5 hours ago. EXAM: CHEST - 2 VIEW COMPARISON:  PA Lat 12/21/2022 FINDINGS: The heart size and mediastinal contours are within normal limits. Both lungs are clear. The visualized skeletal structures are unremarkable. IMPRESSION: No active cardiopulmonary disease.  Unchanged. Electronically Signed   By: Francis Quam M.D.   On: 06/06/2023 22:52    Procedures Procedures    Medications Ordered in ED Medications  lactated ringers  bolus 1,000 mL (0 mLs Intravenous Stopped 06/07/23 0248)  iohexol  (OMNIPAQUE ) 350 MG/ML injection 65 mL (65 mLs Intravenous Contrast Given 06/07/23 0145)    ED Course/ Medical Decision Making/ A&P                                 Medical Decision Making Amount and/or Complexity of Data Reviewed Labs: ordered. Radiology: ordered.  Risk Prescription drug management.   This patient presents to the ED for concern of  chest pain, this involves an extensive number of treatment options, and is a complaint that carries with it a high risk of complications and morbidity.  The differential diagnosis includes ACS, PE, pneumonia, costochondritis, GERD   Co morbidities that complicate the patient evaluation  prostate cancer, HTN, neuropathy, HLD, BPH, chronic pain   Additional history obtained:  Additional history obtained from N/A External records from outside source obtained and reviewed including EMR   Lab Tests:  I Ordered, and personally interpreted labs.  The pertinent results include: Baseline anemia, no leukocytosis, baseline creatinine, normal electrolytes, normal troponins x 2   Imaging Studies ordered:  I ordered imaging studies including chest x-ray, CTA chest I independently visualized and interpreted imaging which showed no acute findings I agree with the radiologist interpretation   Cardiac Monitoring: / EKG:  The patient was maintained on a cardiac monitor.  I personally viewed and interpreted the cardiac monitored which showed an underlying rhythm of: Sinus rhythm   Problem List / ED Course / Critical interventions / Medication management  Patient presents for left-sided chest pain and bilateral chest tightness.  Pain is worsened with deep inspiration.  Vital signs on arrival notable for tachycardia.  On exam, patient is well-appearing.  His breathing is unlabored.  SpO2 is normal on room air.  No wheezing, rubs, murmurs, or rhonchi are noted on auscultation.  He does have some reproducible chest tenderness just left of sternum.  There does appear to be a small subcutaneous nodule there.  He states that his pain is in this area as well as an area of left inframammary line.  Prior to being bedded in the ED, lab work was initiated.  Labs show baseline anemia, no leukocytosis, baseline creatinine, normal electrolytes, normal troponin.  Given his tachycardia and pleuritic pain, CTA of chest  was ordered.  CTA did not show any acute findings.  Patient had improved symptoms while in the ED.  He is agreeable to cardiology outpatient follow-up.  Referral was ordered.  Patient was discharged in stable condition. I ordered medication including IV fluids for hydration Reevaluation of the patient after these medicines showed that the patient improved I have reviewed the patients home medicines and have made adjustments as needed   Social Determinants of Health:  Has PCP  Final Clinical Impression(s) / ED Diagnoses Final diagnoses:  Chest pain, unspecified type    Rx / DC Orders ED Discharge Orders          Ordered    Ambulatory referral to Cardiology       Comments: If you have not heard from the Cardiology office within the next 72 hours please call [phone].   06/07/23 0327              Melvenia Motto, MD 06/07/23 [phone]

## 2023-06-07 NOTE — ED Notes (Signed)
 Pt ambulated to bathroom

## 2023-06-08 NOTE — Progress Notes (Signed)
 No delay - only had 3 tablets left when I saw him. I'm seeing him again on 07/06/23 for second HBV vaccination so will recheck HCV RNA then.

## 2023-06-11 ENCOUNTER — Emergency Department (HOSPITAL_COMMUNITY): Payer: Medicaid Other

## 2023-06-11 ENCOUNTER — Emergency Department (HOSPITAL_COMMUNITY)
Admission: EM | Admit: 2023-06-11 | Discharge: 2023-06-11 | Disposition: A | Discharge status: Home / Self Care | Payer: Medicaid Other | Attending: Emergency Medicine | Admitting: Emergency Medicine

## 2023-06-11 ENCOUNTER — Other Ambulatory Visit: Payer: Self-pay

## 2023-06-11 ENCOUNTER — Encounter (HOSPITAL_COMMUNITY): Payer: Self-pay | Admitting: *Deleted

## 2023-06-11 DIAGNOSIS — R109 Unspecified abdominal pain: Secondary | ICD-10-CM | POA: Diagnosis not present

## 2023-06-11 DIAGNOSIS — R1011 Right upper quadrant pain: Secondary | ICD-10-CM | POA: Diagnosis not present

## 2023-06-11 DIAGNOSIS — K4090 Unilateral inguinal hernia, without obstruction or gangrene, not specified as recurrent: Secondary | ICD-10-CM | POA: Diagnosis not present

## 2023-06-11 DIAGNOSIS — N400 Enlarged prostate without lower urinary tract symptoms: Secondary | ICD-10-CM | POA: Diagnosis not present

## 2023-06-11 LAB — COMPREHENSIVE METABOLIC PANEL
ALT: 13 U/L (ref 0–44)
AST: 22 U/L (ref 15–41)
Albumin: 3.5 g/dL (ref 3.5–5.0)
Alkaline Phosphatase: 81 U/L (ref 38–126)
Anion gap: 12 (ref 5–15)
BUN: 23 mg/dL (ref 8–23)
CO2: 22 mmol/L (ref 22–32)
Calcium: 9.4 mg/dL (ref 8.9–10.3)
Chloride: 105 mmol/L (ref 98–111)
Creatinine, Ser: 1.59 mg/dL — ABNORMAL HIGH (ref 0.61–1.24)
GFR, Estimated: 49 mL/min — ABNORMAL LOW (ref >60)
Glucose, Bld: 138 mg/dL — ABNORMAL HIGH (ref 70–99)
Potassium: 4.3 mmol/L (ref 3.5–5.1)
Sodium: 139 mmol/L (ref 135–145)
Total Bilirubin: 0.5 mg/dL (ref 0.0–1.2)
Total Protein: 7.4 g/dL (ref 6.5–8.1)

## 2023-06-11 LAB — URINALYSIS, ROUTINE W REFLEX MICROSCOPIC
Bilirubin Urine: NEGATIVE
Glucose, UA: NEGATIVE mg/dL
Hgb urine dipstick: NEGATIVE
Ketones, ur: NEGATIVE mg/dL
Leukocytes,Ua: NEGATIVE
Nitrite: NEGATIVE
Protein, ur: NEGATIVE mg/dL
Specific Gravity, Urine: 1.017 (ref 1.005–1.030)
pH: 5 (ref 5.0–8.0)

## 2023-06-11 LAB — CBC
HCT: 33 % — ABNORMAL LOW (ref 39.0–52.0)
Hemoglobin: 10.7 g/dL — ABNORMAL LOW (ref 13.0–17.0)
MCH: 32.4 pg (ref 26.0–34.0)
MCHC: 32.4 g/dL (ref 30.0–36.0)
MCV: 100 fL (ref 80.0–100.0)
Platelets: 169 10*3/uL (ref 150–400)
RBC: 3.3 MIL/uL — ABNORMAL LOW (ref 4.22–5.81)
RDW: 12.2 % (ref 11.5–15.5)
WBC: 5.6 10*3/uL (ref 4.0–10.5)
nRBC: 0 % (ref 0.0–0.2)

## 2023-06-11 LAB — LIPASE, BLOOD: Lipase: 35 U/L (ref 11–51)

## 2023-06-11 MED ORDER — DICYCLOMINE HCL 20 MG PO TABS: 20.0000 mg | ORAL_TABLET | Freq: Two times a day (BID) | ORAL | 0 refills | Status: DC

## 2023-06-11 MED ORDER — FAMOTIDINE 20 MG PO TABS: 20.0000 mg | ORAL_TABLET | Freq: Two times a day (BID) | ORAL | 0 refills | Status: DC

## 2023-06-11 MED ORDER — SUCRALFATE 1 G PO TABS: 1.0000 g | ORAL_TABLET | Freq: Three times a day (TID) | ORAL | 0 refills | Status: DC

## 2023-06-11 MED ORDER — MORPHINE SULFATE (PF) 4 MG/ML IV SOLN
4.0000 mg | Freq: Once | INTRAVENOUS | Status: AC
  Administered 2023-06-11: 4 mg via INTRAVENOUS
  Filled 2023-06-11: qty 1

## 2023-06-11 MED ORDER — ONDANSETRON HCL 4 MG/2ML IJ SOLN
4.0000 mg | Freq: Once | INTRAMUSCULAR | Status: AC
  Administered 2023-06-11: 4 mg via INTRAVENOUS
  Filled 2023-06-11: qty 2

## 2023-06-11 NOTE — ED Triage Notes (Signed)
 The pt is c/o abd pain today no n or v he reports drinking alcohol earlier today frequent use

## 2023-06-11 NOTE — ED Provider Notes (Signed)
 [facility] Emergency Department Provider Note MRN:  [id number]  Arrival date & time: 06/11/23     Chief Complaint   Abdominal Pain   History of Present Illness   Shaun Morgan is a 63 y.o. year-old male presents to the ED with chief complaint of right flank pain.  Patient states that he woke up with the pain.  He denies any associated fevers, chills, nausea, vomiting, or diarrhea.  Denies any dysuria or hematuria.  He states that it feels like his liver is hurting.  He states that he was drinking alcohol yesterday.  States that he has had similar pain in the past.  He denies any treatments prior to arrival.  Denies any prior abdominal surgeries.  History provided by patient.   Review of Systems  Pertinent positive and negative review of systems noted in HPI.    Physical Exam   Vitals:   06/11/23 0349 06/11/23 0350  BP: 125/75 125/75  Pulse:  86  Resp: 14 16  Temp:  98.3 F (36.8 C)  SpO2:  97%    CONSTITUTIONAL:  non toxic-appearing, NAD NEURO:  Alert and oriented x 3, CN 3-12 grossly intact EYES:  eyes equal and reactive ENT/NECK:  Supple, no stridor  CARDIO:  tachycardic, regular rhythm, appears well-perfused  PULM:  No respiratory distress, CTAB GI/GU:  non-distended, no focal abdominal tenderness MSK/SPINE:  No gross deformities, no edema, moves all extremities  SKIN:  no rash, atraumatic   *Additional and/or pertinent findings included in MDM below  Diagnostic and Interventional Summary    EKG Interpretation Date/Time:  Saturday June 11 2023 03:48:15 EST Ventricular Rate:  89 PR Interval:  168 QRS Duration:  81 QT Interval:  366 QTC Calculation: 446 R Axis:   41  Text Interpretation: Sinus rhythm Interpretation limited secondary to artifact Confirmed by Midge Golas ([id number]) on 06/11/2023 3:55:45 AM       Labs Reviewed  COMPREHENSIVE METABOLIC PANEL - Abnormal; Notable for the following components:      Result  Value   Glucose, Bld 138 (*)    Creatinine, Ser 1.59 (*)    GFR, Estimated 49 (*)    All other components within normal limits  CBC - Abnormal; Notable for the following components:   RBC 3.30 (*)    Hemoglobin 10.7 (*)    HCT 33.0 (*)    All other components within normal limits  LIPASE, BLOOD  URINALYSIS, ROUTINE W REFLEX MICROSCOPIC    US  Abdomen Limited RUQ (LIVER/GB)  Final Result    CT Renal Stone Study  Final Result      Medications  morphine  (PF) 4 MG/ML injection 4 mg (4 mg Intravenous Given 06/11/23 0147)  ondansetron  (ZOFRAN ) injection 4 mg (4 mg Intravenous Given 06/11/23 0146)     Procedures  /  Critical Care Procedures  ED Course and Medical Decision Making  I have reviewed the triage vital signs, the nursing notes, and pertinent available records from the EMR.  Social Determinants Affecting Complexity of Care: Patient has no clinically significant social determinants affecting this chief complaint..   ED Course:    Medical Decision Making Patient here with RUQ abdominal pain /flank pain.  The pain was sudden in onset.  He hasn't had any n/v/d.  No dysuria.  Consider KS or gallbladder pathology.   CT renal negative for KS.  Will check US  to assess for gallbladder pathology.   US  negative.  Creatinine is at baseline.  No leukocytosis or  anemia.  UA is negative, doubt UTI.  Lipase is normal, doubt pancreatitis.  Amount and/or Complexity of Data Reviewed Labs: ordered. Radiology: ordered. ECG/medicine tests: ordered.  Risk Prescription drug management.         Consultants: No consultations were needed in caring for this patient.   Treatment and Plan: I considered admission due to patient's initial presentation, but after considering the examination and diagnostic results, patient will not require admission and can be discharged with outpatient follow-up.    Final Clinical Impressions(s) / ED Diagnoses     ICD-10-CM   1. Right  upper quadrant abdominal pain  R10.11       ED Discharge Orders          Ordered    sucralfate  (CARAFATE ) 1 g tablet  3 times daily with meals & bedtime        06/11/23 0523    famotidine  (PEPCID ) 20 MG tablet  2 times daily        06/11/23 0523    dicyclomine  (BENTYL ) 20 MG tablet  2 times daily        06/11/23 9476              Discharge Instructions Discussed with and Provided to Patient:   Discharge Instructions   None      Vicky Charleston, PA-C 06/11/23 0526    Midge Golas, MD 06/11/23 [phone]

## 2023-06-14 ENCOUNTER — Other Ambulatory Visit: Payer: Self-pay

## 2023-06-15 ENCOUNTER — Other Ambulatory Visit: Payer: Self-pay

## 2023-06-15 NOTE — Progress Notes (Signed)
 Specialty Pharmacy Refill Coordination Note  Shaun Morgan is a 63 y.o. male contacted today regarding refills of specialty medication(s) Sofosbuvir -Velpatasvir    Patient requested Courier to Provider Office   Delivery date: 07/04/23   Verified address: [address] [name]   Medication will be filled on 07/01/23.

## 2023-06-20 ENCOUNTER — Encounter: Payer: Self-pay | Admitting: Podiatry

## 2023-06-20 LAB — CULTURE, FUNGUS WITHOUT SMEAR
MICRO NUMBER:: 15915093
SPECIMEN QUALITY:: ADEQUATE

## 2023-06-21 ENCOUNTER — Emergency Department (HOSPITAL_COMMUNITY): Payer: Medicaid Other

## 2023-06-21 ENCOUNTER — Encounter (HOSPITAL_COMMUNITY): Payer: Self-pay

## 2023-06-21 ENCOUNTER — Emergency Department (HOSPITAL_COMMUNITY)
Admission: EM | Admit: 2023-06-21 | Discharge: 2023-06-21 | Disposition: A | Discharge status: Home / Self Care | Payer: Medicaid Other | Attending: Emergency Medicine | Admitting: Emergency Medicine

## 2023-06-21 ENCOUNTER — Other Ambulatory Visit: Payer: Self-pay

## 2023-06-21 DIAGNOSIS — S60511A Abrasion of right hand, initial encounter: Secondary | ICD-10-CM | POA: Insufficient documentation

## 2023-06-21 DIAGNOSIS — M79641 Pain in right hand: Secondary | ICD-10-CM

## 2023-06-21 DIAGNOSIS — W19XXXA Unspecified fall, initial encounter: Secondary | ICD-10-CM | POA: Diagnosis not present

## 2023-06-21 DIAGNOSIS — T07XXXA Unspecified multiple injuries, initial encounter: Secondary | ICD-10-CM

## 2023-06-21 DIAGNOSIS — S60512A Abrasion of left hand, initial encounter: Secondary | ICD-10-CM | POA: Insufficient documentation

## 2023-06-21 DIAGNOSIS — M79642 Pain in left hand: Secondary | ICD-10-CM | POA: Diagnosis not present

## 2023-06-21 DIAGNOSIS — Z23 Encounter for immunization: Secondary | ICD-10-CM | POA: Diagnosis not present

## 2023-06-21 DIAGNOSIS — M19041 Primary osteoarthritis, right hand: Secondary | ICD-10-CM | POA: Diagnosis not present

## 2023-06-21 DIAGNOSIS — S6991XA Unspecified injury of right wrist, hand and finger(s), initial encounter: Secondary | ICD-10-CM | POA: Diagnosis present

## 2023-06-21 DIAGNOSIS — M19042 Primary osteoarthritis, left hand: Secondary | ICD-10-CM | POA: Diagnosis not present

## 2023-06-21 MED ORDER — OXYCODONE HCL 5 MG PO TABS
5.0000 mg | ORAL_TABLET | Freq: Once | ORAL | Status: AC
  Administered 2023-06-21: 5 mg via ORAL
  Filled 2023-06-21: qty 1

## 2023-06-21 MED ORDER — BACITRACIN ZINC 500 UNIT/GM EX OINT
TOPICAL_OINTMENT | Freq: Two times a day (BID) | CUTANEOUS | Status: DC
  Filled 2023-06-21: qty 3.6

## 2023-06-21 MED ORDER — OXYCODONE HCL 5 MG PO TABS: 5.0000 mg | ORAL_TABLET | Freq: Four times a day (QID) | ORAL | 0 refills | Status: DC | PRN

## 2023-06-21 MED ORDER — TETANUS-DIPHTH-ACELL PERTUSSIS 5-2.5-18.5 LF-MCG/0.5 IM SUSY
0.5000 mL | PREFILLED_SYRINGE | Freq: Once | INTRAMUSCULAR | Status: AC
  Administered 2023-06-21: 0.5 mL via INTRAMUSCULAR
  Filled 2023-06-21: qty 0.5

## 2023-06-21 NOTE — ED Provider Notes (Signed)
[place] EMERGENCY DEPARTMENT AT [facility] Provider Note   CSN: [id number] Arrival date & time: 06/21/23  1418     History  Chief Complaint  Patient presents with   Shaun Morgan is a 63 y.o. male here for evaluation mechanical fall.  Patient states he slipped and fell on the ice yesterday.  Landed on his hands.  He suffered abrasions across his PIP on his bilateral hands.  He has had pain to his hands since.  He denies hitting his head, LOC or anticoagulation.  States he was not intoxicated when he fell this is purely a mechanical slip and fall.  He denies any back pain, numbness, weakness, chest pain, shortness of breath, abdominal pain, presyncope.  He states he cannot take Tylenol due to allergy and he cannot take NSAIDs due to his kidney function so he is not taking anything aside from his normal gabapentin.  Last tetanus about 6 years ago  HPI     Home Medications Prior to Admission medications   Medication Sig Start Date End Date Taking? Authorizing Provider  oxyCODONE (ROXICODONE) 5 MG immediate release tablet Take 1 tablet (5 mg total) by mouth every 6 (six) hours as needed for severe pain (pain score 7-10). 06/21/23  Yes Shiraz Bastyr A, PA-C  albuterol (PROVENTIL HFA;VENTOLIN HFA) 108 (90 BASE) MCG/ACT inhaler Inhale 2 puffs into the lungs every 6 (six) hours as needed for wheezing or shortness of breath.    [provider]  bismuth subsalicylate (PEPTO-BISMOL) 262 MG chewable tablet Chew 2 tablets (524 mg total) by mouth as needed. 12/27/22   Bruning, Ashlyn, PA-C  Calcium Carb-Cholecalciferol (CALCIUM + VITAMIN D3) 600-10 MG-MCG TABS Take 1 tablet by mouth 2 (two) times daily with a meal. 12/27/22   Bruning, Ashlyn, PA-C  ciclopirox (PENLAC) 8 % solution Apply topically at bedtime. Apply over nail and surrounding skin. Apply daily over previous coat. After seven (7) days, may remove with alcohol and continue cycle. 06/03/23   Vivi Barrack, DPM  clotrimazole-betamethasone (LOTRISONE) cream Apply 1 Application topically daily. 05/24/23   Clayborne Dana, NP  dicyclomine (BENTYL) 20 MG tablet Take 1 tablet (20 mg total) by mouth 2 (two) times daily. 06/11/23   Roxy Horseman, PA-C  famotidine (PEPCID) 20 MG tablet Take 1 tablet (20 mg total) by mouth 2 (two) times daily. 06/11/23   Roxy Horseman, PA-C  fluticasone-salmeterol (WIXELA INHUB) 100-50 MCG/ACT AEPB Inhale 1 puff into the lungs 2 (two) times daily. 04/06/23   Clayborne Dana, NP  gabapentin (NEURONTIN) 300 MG capsule Take 1 capsule (300 mg total) by mouth 3 (three) times daily. 06/06/23   Clayborne Dana, NP  hydrocortisone cream 1 % Apply 1 Application topically 2 (two) times daily. 12/27/22   Bruning, Ashlyn, PA-C  ketoconazole (NIZORAL) 2 % cream Apply 1 Application topically daily. 06/03/23   Vivi Barrack, DPM  lidocaine (XYLOCAINE) 5 % ointment Apply 1 Application topically as needed. 01/07/23   Linwood Dibbles, MD  losartan-hydrochlorothiazide (HYZAAR) 100-25 MG tablet Take 1 tablet by mouth daily.    [provider]  Sofosbuvir-Velpatasvir (EPCLUSA) 400-100 MG TABS Take 1 tablet by mouth daily. 05/03/23   Blanchard Kelch, NP  solifenacin (VESICARE) 5 MG tablet Take 1 tablet (5 mg total) by mouth at bedtime. 05/12/23   Stoneking, Danford Bad., MD  sucralfate (CARAFATE) 1 g tablet Take 1 tablet (1 g total) by mouth 4 (four) times daily -  with meals and at bedtime. 06/11/23   Roxy Horseman, PA-C  tamsulosin (FLOMAX) 0.4 MG CAPS capsule Take 2 capsules (0.8 mg total) by mouth daily after supper. 04/12/23   Clayborne Dana, NP      Allergies    Acetaminophen    Review of Systems   Review of Systems  Constitutional: Negative.   HENT: Negative.    Respiratory: Negative.    Cardiovascular: Negative.   Gastrointestinal: Negative.   Musculoskeletal:        Bilateral hand pain  Skin:  Positive for wound.  Neurological: Negative.   All other systems  reviewed and are negative.   Physical Exam Updated Vital Signs BP (!) 136/94 (BP Location: Right Arm)   Pulse 93   Temp 98.3 F (36.8 C)   Resp (!) 24   Ht 5\' 10"  (1.778 m)   Wt 99.8 kg   SpO2 99%   BMI 31.57 kg/m  Physical Exam Vitals and nursing note reviewed.  Constitutional:      General: He is not in acute distress.    Appearance: He is well-developed. He is not ill-appearing, toxic-appearing or diaphoretic.  HENT:     Head: Normocephalic and atraumatic.     Mouth/Throat:     Mouth: Mucous membranes are moist.  Eyes:     Pupils: Pupils are equal, round, and reactive to light.  Cardiovascular:     Rate and Rhythm: Normal rate and regular rhythm.     Pulses: Normal pulses.          Radial pulses are 2+ on the right side and 2+ on the left side.  Pulmonary:     Effort: Pulmonary effort is normal. No respiratory distress.     Breath sounds: Normal breath sounds and air entry.  Chest:     Comments: Nontender chest wall, no crepitus Abdominal:     General: Bowel sounds are normal. There is no distension.     Palpations: Abdomen is soft.     Tenderness: There is no abdominal tenderness.  Musculoskeletal:        General: Normal range of motion.     Cervical back: Normal range of motion and neck supple.     Comments: No midline C/T/L tenderness.  Nontender bilateral lower extremities.  Nontender bilateral humerus, forearm, wrist.  Diffuse tenderness to digits bilaterally.  He has abrasions to all 10 digits at PIP however no lacerations to suture.  Skin:    General: Skin is warm and dry.     Comments: Abrasions, no lacerations.  Neurological:     General: No focal deficit present.     Mental Status: He is alert and oriented to person, place, and time.     Cranial Nerves: Cranial nerves 2-12 are intact.     Sensory: Sensation is intact.     Motor: Motor function is intact.     Gait: Gait is intact.     Comments: Intact sensation, ambulatory     ED Results /  Procedures / Treatments   Labs (all labs ordered are listed, but only abnormal results are displayed) Labs Reviewed - No data to display  EKG None  Radiology DG Hand Complete Left Result Date: 06/21/2023 CLINICAL DATA:  Bilateral hand pain, fell yesterday EXAM: RIGHT HAND - COMPLETE 3+ VIEW; LEFT HAND - COMPLETE 3+ VIEW COMPARISON:  None Available. FINDINGS: Left hand: Frontal, oblique, and lateral views are obtained. No acute fracture, subluxation, or dislocation. Osteoarthritis most pronounced within the first digit  and diffusely throughout the interphalangeal joints. The soft tissues are unremarkable. Right Hand: Frontal, oblique, and lateral views are obtained. No acute fracture, subluxation, or dislocation. There is a well corticated ossific density dorsal to the right wrist likely sequela of remote trauma. Evidence of prior healed fifth metacarpal fracture. Osteoarthritis most pronounced within the first digit and diffusely throughout the interphalangeal joints. The soft tissues are unremarkable. IMPRESSION: 1. Bilateral osteoarthritis. 2. No acute displaced fracture within either hand. Electronically Signed   By: Sharlet Salina M.D.   On: 06/21/2023 15:41   DG Hand Complete Right Result Date: 06/21/2023 CLINICAL DATA:  Bilateral hand pain, fell yesterday EXAM: RIGHT HAND - COMPLETE 3+ VIEW; LEFT HAND - COMPLETE 3+ VIEW COMPARISON:  None Available. FINDINGS: Left hand: Frontal, oblique, and lateral views are obtained. No acute fracture, subluxation, or dislocation. Osteoarthritis most pronounced within the first digit and diffusely throughout the interphalangeal joints. The soft tissues are unremarkable. Right Hand: Frontal, oblique, and lateral views are obtained. No acute fracture, subluxation, or dislocation. There is a well corticated ossific density dorsal to the right wrist likely sequela of remote trauma. Evidence of prior healed fifth metacarpal fracture. Osteoarthritis most pronounced  within the first digit and diffusely throughout the interphalangeal joints. The soft tissues are unremarkable. IMPRESSION: 1. Bilateral osteoarthritis. 2. No acute displaced fracture within either hand. Electronically Signed   By: Sharlet Salina M.D.   On: 06/21/2023 15:41    Procedures Procedures    Medications Ordered in ED Medications  bacitracin ointment (has no administration in time range)  Tdap (BOOSTRIX) injection 0.5 mL (0.5 mLs Intramuscular Given 06/21/23 1506)  oxyCODONE (Oxy IR/ROXICODONE) immediate release tablet 5 mg (5 mg Oral Given 06/21/23 1506)    ED Course/ Medical Decision Making/ A&P   63 year old here for evaluation mechanical fall which occurred yesterday.  He denies any head injury, presyncopal symptoms.  Patient neurovascularly intact.  No midline C/T/L tenderness.  States he slipped and fell on ice.  Ambulatory PTA.  He has diffuse tenderness to his bilateral hands, mostly at his digits.  He has overlying abrasions, his tetanus was updated.  He has no lacerations to suture.  He has full range of motion.  He is nontender at his wrist bilaterally, specifically scaphoid.  Nontender forearm, humerus.  Nontender lower extremities.  He was given Roxicodone here which helped his pain.'s were cleaned.  Bacitracin applied.  Plan on x-ray  X-ray bilateral hands personally viewed interpreted No fracture, dislocation  Discussed results with patient.  He is unable to take NSAIDs or Tylenol at home.  Wrote for something to help him with his pain.  Discussed ice, elevation.  Will have him follow-up outpatient, return for any worsening symptoms.  The patient has been appropriately medically screened and/or stabilized in the ED. I have low suspicion for any other emergent medical condition which would require further screening, evaluation or treatment in the ED or require inpatient management.  Patient is hemodynamically stable and in no acute distress.  Patient able to ambulate in  department prior to ED.  Evaluation does not show acute pathology that would require ongoing or additional emergent interventions while in the emergency department or further inpatient treatment.  I have discussed the diagnosis with the patient and answered all questions.  Pain is been managed while in the emergency department and patient has no further complaints prior to discharge.  Patient is comfortable with plan discussed in room and is stable for discharge at this time.  I have discussed strict return precautions for returning to the emergency department.  Patient was encouraged to follow-up with PCP/specialist refer to at discharge.                                Medical Decision Making Amount and/or Complexity of Data Reviewed External Data Reviewed: radiology and notes. Radiology: ordered and independent interpretation performed. Decision-making details documented in ED Course.  Risk OTC drugs. Prescription drug management. Decision regarding hospitalization. Diagnosis or treatment significantly limited by social determinants of health.           Final Clinical Impression(s) / ED Diagnoses Final diagnoses:  Fall, initial encounter  Bilateral hand pain  Abrasions of multiple sites    Rx / DC Orders ED Discharge Orders          Ordered    oxyCODONE (ROXICODONE) 5 MG immediate release tablet  Every 6 hours PRN        06/21/23 1705              Manas Hickling A, PA-C 06/21/23 1711    Rondel Baton, MD 06/22/23 [phone]

## 2023-06-21 NOTE — ED Provider Triage Note (Signed)
Emergency Medicine Provider Triage Evaluation Note  Shaun Morgan , a 63 y.o. male  was evaluated in triage.  Pt complains of bilateral hand pain after fall last night.  Patient was going down the stairs when he missed stepped and fell and landed with his hands outstretched.  Patient has injury to head or neck area and denies any pain or vision changes or LOC.  Patient is on any blood thinners.  Patient not taking any pain meds but notes that his hands have gotten progressively more more painful.  Patient states he has lacerations to his fingers and is concerned he has a fracture.  Patient still feel move his fingers.  Patient denies skin color changes but notes that his hands do appear swollen.  Last tetanus was 5 to 6 years ago..  Review of Systems  Positive:  Negative:   Physical Exam  BP (!) 136/94 (BP Location: Right Arm)   Pulse 93   Temp 98.3 F (36.8 C)   Resp (!) 24   Ht 5\' 10"  (1.778 m)   Wt 99.8 kg   SpO2 99%   BMI 31.57 kg/m  Gen:   Awake, no distress   Resp:  Normal effort  MSK:   Able to minimally range fingers but limited due to pain, both hands do.  Slightly edematous, no overlying skin color changes or warmth noted or purulent material Other:  Capillary refill in all 10 digits less than 2 seconds, minor lacerations noted to dorsal aspect of fingers with no bony protrusions  Medical Decision Making  Medically screening exam initiated at 2:53 PM.  Appropriate orders placed.  Diego Duble Krisko was informed that the remainder of the evaluation will be completed by another provider, this initial triage assessment does not replace that evaluation, and the importance of remaining in the ED until their evaluation is complete.  Workup initiated, patient stable at this time.   Netta Corrigan, PA-C 06/21/23 1455

## 2023-06-21 NOTE — Discharge Instructions (Addendum)
It was a pleasure taking care of you today  I have written you for a short course of pain medicine.  Do not drive or operate heavy machinery while taking medication.  Make sure to follow-up outpatient, return for any worsening symptoms

## 2023-06-21 NOTE — ED Triage Notes (Signed)
Pt states he slipped on ice last night and hurt both of his hands. Pt denies hurting anything else, denies LOC.

## 2023-06-22 ENCOUNTER — Telehealth: Payer: Self-pay | Admitting: *Deleted

## 2023-06-22 NOTE — Telephone Encounter (Signed)
Pharmacy called regarding diagnosis for pt prescribed oxycodone.  RNCM reviewed the chart to find pt dx was fall with bilateral hand pain.

## 2023-06-25 ENCOUNTER — Other Ambulatory Visit: Payer: Self-pay

## 2023-06-25 ENCOUNTER — Encounter (HOSPITAL_COMMUNITY): Payer: Self-pay | Admitting: *Deleted

## 2023-06-25 ENCOUNTER — Emergency Department (HOSPITAL_COMMUNITY)
Admission: EM | Admit: 2023-06-25 | Discharge: 2023-06-25 | Disposition: A | Discharge status: Home / Self Care | Payer: Medicaid Other | Attending: Emergency Medicine | Admitting: Emergency Medicine

## 2023-06-25 DIAGNOSIS — M5432 Sciatica, left side: Secondary | ICD-10-CM | POA: Diagnosis not present

## 2023-06-25 DIAGNOSIS — I129 Hypertensive chronic kidney disease with stage 1 through stage 4 chronic kidney disease, or unspecified chronic kidney disease: Secondary | ICD-10-CM | POA: Insufficient documentation

## 2023-06-25 DIAGNOSIS — Z79899 Other long term (current) drug therapy: Secondary | ICD-10-CM | POA: Diagnosis not present

## 2023-06-25 DIAGNOSIS — M5442 Lumbago with sciatica, left side: Secondary | ICD-10-CM | POA: Diagnosis not present

## 2023-06-25 DIAGNOSIS — R202 Paresthesia of skin: Secondary | ICD-10-CM | POA: Diagnosis present

## 2023-06-25 DIAGNOSIS — N189 Chronic kidney disease, unspecified: Secondary | ICD-10-CM | POA: Insufficient documentation

## 2023-06-25 LAB — CBC WITH DIFFERENTIAL/PLATELET
Abs Immature Granulocytes: 0.02 10*3/uL (ref 0.00–0.07)
Basophils Absolute: 0 10*3/uL (ref 0.0–0.1)
Basophils Relative: 1 %
Eosinophils Absolute: 0.1 10*3/uL (ref 0.0–0.5)
Eosinophils Relative: 2 %
HCT: 32.2 % — ABNORMAL LOW (ref 39.0–52.0)
Hemoglobin: 10.8 g/dL — ABNORMAL LOW (ref 13.0–17.0)
Immature Granulocytes: 1 %
Lymphocytes Relative: 27 %
Lymphs Abs: 1.2 10*3/uL (ref 0.7–4.0)
MCH: 32.7 pg (ref 26.0–34.0)
MCHC: 33.5 g/dL (ref 30.0–36.0)
MCV: 97.6 fL (ref 80.0–100.0)
Monocytes Absolute: 0.5 10*3/uL (ref 0.1–1.0)
Monocytes Relative: 11 %
Neutro Abs: 2.6 10*3/uL (ref 1.7–7.7)
Neutrophils Relative %: 58 %
Platelets: 144 10*3/uL — ABNORMAL LOW (ref 150–400)
RBC: 3.3 MIL/uL — ABNORMAL LOW (ref 4.22–5.81)
RDW: 12.3 % (ref 11.5–15.5)
WBC: 4.3 10*3/uL (ref 4.0–10.5)
nRBC: 0 % (ref 0.0–0.2)

## 2023-06-25 LAB — BASIC METABOLIC PANEL
Anion gap: 10 (ref 5–15)
BUN: 28 mg/dL — ABNORMAL HIGH (ref 8–23)
CO2: 23 mmol/L (ref 22–32)
Calcium: 9.5 mg/dL (ref 8.9–10.3)
Chloride: 103 mmol/L (ref 98–111)
Creatinine, Ser: 1.47 mg/dL — ABNORMAL HIGH (ref 0.61–1.24)
GFR, Estimated: 54 mL/min — ABNORMAL LOW (ref >60)
Glucose, Bld: 123 mg/dL — ABNORMAL HIGH (ref 70–99)
Potassium: 3.9 mmol/L (ref 3.5–5.1)
Sodium: 136 mmol/L (ref 135–145)

## 2023-06-25 LAB — D-DIMER, QUANTITATIVE: D-Dimer, Quant: 0.37 ug{FEU}/mL (ref 0.00–0.50)

## 2023-06-25 MED ORDER — PREDNISONE 50 MG PO TABS
50.0000 mg | ORAL_TABLET | Freq: Every day | ORAL | 0 refills | Status: DC
Start: 2023-06-25 — End: 2023-07-19

## 2023-06-25 MED ORDER — OXYCODONE-ACETAMINOPHEN 5-325 MG PO TABS: 1.0000 | ORAL_TABLET | Freq: Four times a day (QID) | ORAL | 0 refills | Status: DC | PRN

## 2023-06-25 MED ORDER — DEXAMETHASONE SODIUM PHOSPHATE 10 MG/ML IJ SOLN
10.0000 mg | Freq: Once | INTRAMUSCULAR | Status: AC
  Administered 2023-06-25: 10 mg via INTRAMUSCULAR
  Filled 2023-06-25: qty 1

## 2023-06-25 NOTE — ED Provider Triage Note (Addendum)
Emergency Medicine Provider Triage Evaluation Note  Shaun Morgan , a 63 y.o. male  was evaluated in triage.  Pt complains of intermittent anterior L thigh numbness/tingling that started 1.5 weeks ago. Previously seen for a fall 06/25/2023, did not mention this at previous visit. Tingling worse at night lasting about 10 minutes. States his leg becomes cold. Able to ambulate without difficulty. 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   Denies fevers, headaches, chest pain, shortness of breath, abdominal pain, n/v/d, dysuria, weakness, LE swelling, LE pain, rashes, incontinence.   Has appointment with PCP late February.   Review of Systems  Positive: See above Negative: See above  Physical Exam  BP (!) 147/99 (BP Location: Right Arm)   Pulse 90   Temp 98.1 F (36.7 C)   Resp 16   Ht 5\' 10"  (1.778 m)   Wt 99.8 kg   SpO2 94%   BMI 31.57 kg/m  Gen:   Awake, no distress   Resp:  Normal effort  MSK:   Moves extremities without difficulty  Other:    Medical Decision Making  Medically screening exam initiated at 5:50 PM.  Appropriate orders placed.  Shaun Morgan was informed that the remainder of the evaluation will be completed by another provider, this initial triage assessment does not replace that evaluation, and the importance of remaining in the ED until their evaluation is complete.     Lunette Stands, [place] 06/25/23 1756    Lunette Stands, [place] 06/25/23 1758

## 2023-06-25 NOTE — ED Provider Notes (Signed)
[place] EMERGENCY DEPARTMENT AT [facility] Provider Note   CSN: [id number] Arrival date & time: 06/25/23  1708     History  Chief Complaint  Patient presents with   Leg Pain    Shaun Morgan is a 63 y.o. male.  Pt is a 63 yo male with pmhx significant for htn, hep c, hx syphilis, and ckd.  Pt said he's been having sensations that his left thigh is "ice cold."  He said hit hurts and has some tingling.  He does have a hx of sciatica.  No trauma.  He is able to ambulate.       Home Medications Prior to Admission medications   Medication Sig Start Date End Date Taking? Authorizing Provider  oxyCODONE-acetaminophen (PERCOCET/ROXICET) 5-325 MG tablet Take 1 tablet by mouth every 6 (six) hours as needed for severe pain (pain score 7-10). 06/25/23  Yes Jacalyn Lefevre, MD  predniSONE (DELTASONE) 50 MG tablet Take 1 tablet (50 mg total) by mouth daily with breakfast. 06/25/23  Yes Jacalyn Lefevre, MD  albuterol (PROVENTIL HFA;VENTOLIN HFA) 108 (90 BASE) MCG/ACT inhaler Inhale 2 puffs into the lungs every 6 (six) hours as needed for wheezing or shortness of breath.    [provider]  bismuth subsalicylate (PEPTO-BISMOL) 262 MG chewable tablet Chew 2 tablets (524 mg total) by mouth as needed. 12/27/22   Bruning, Ashlyn, PA-C  Calcium Carb-Cholecalciferol (CALCIUM + VITAMIN D3) 600-10 MG-MCG TABS Take 1 tablet by mouth 2 (two) times daily with a meal. 12/27/22   Bruning, Ashlyn, PA-C  ciclopirox (PENLAC) 8 % solution Apply topically at bedtime. Apply over nail and surrounding skin. Apply daily over previous coat. After seven (7) days, may remove with alcohol and continue cycle. 06/03/23   Vivi Barrack, DPM  clotrimazole-betamethasone (LOTRISONE) cream Apply 1 Application topically daily. 05/24/23   Clayborne Dana, NP  dicyclomine (BENTYL) 20 MG tablet Take 1 tablet (20 mg total) by mouth 2 (two) times daily. 06/11/23   Roxy Horseman, PA-C  famotidine (PEPCID) 20  MG tablet Take 1 tablet (20 mg total) by mouth 2 (two) times daily. 06/11/23   Roxy Horseman, PA-C  fluticasone-salmeterol (WIXELA INHUB) 100-50 MCG/ACT AEPB Inhale 1 puff into the lungs 2 (two) times daily. 04/06/23   Clayborne Dana, NP  gabapentin (NEURONTIN) 300 MG capsule Take 1 capsule (300 mg total) by mouth 3 (three) times daily. 06/06/23   Clayborne Dana, NP  hydrocortisone cream 1 % Apply 1 Application topically 2 (two) times daily. 12/27/22   Bruning, Ashlyn, PA-C  ketoconazole (NIZORAL) 2 % cream Apply 1 Application topically daily. 06/03/23   Vivi Barrack, DPM  lidocaine (XYLOCAINE) 5 % ointment Apply 1 Application topically as needed. 01/07/23   Linwood Dibbles, MD  losartan-hydrochlorothiazide (HYZAAR) 100-25 MG tablet Take 1 tablet by mouth daily.    [provider]  oxyCODONE (ROXICODONE) 5 MG immediate release tablet Take 1 tablet (5 mg total) by mouth every 6 (six) hours as needed for severe pain (pain score 7-10). 06/21/23   Henderly, Britni A, PA-C  Sofosbuvir-Velpatasvir (EPCLUSA) 400-100 MG TABS Take 1 tablet by mouth daily. 05/03/23   Blanchard Kelch, NP  solifenacin (VESICARE) 5 MG tablet Take 1 tablet (5 mg total) by mouth at bedtime. 05/12/23   Stoneking, Danford Bad., MD  sucralfate (CARAFATE) 1 g tablet Take 1 tablet (1 g total) by mouth 4 (four) times daily -  with meals and at bedtime. 06/11/23   Roxy Horseman,  PA-C  tamsulosin (FLOMAX) 0.4 MG CAPS capsule Take 2 capsules (0.8 mg total) by mouth daily after supper. 04/12/23   Clayborne Dana, NP      Allergies    Acetaminophen    Review of Systems   Review of Systems  Neurological:  Positive for numbness.  All other systems reviewed and are negative.   Physical Exam Updated Vital Signs BP (!) 147/99 (BP Location: Right Arm)   Pulse 90   Temp 98.1 F (36.7 C)   Resp 16   Ht 5\' 10"  (1.778 m)   Wt 99.8 kg   SpO2 94%   BMI 31.57 kg/m  Physical Exam Vitals and nursing note reviewed.  Constitutional:       Appearance: Normal appearance.  HENT:     Head: Normocephalic and atraumatic.     Right Ear: External ear normal.     Left Ear: External ear normal.     Nose: Nose normal.     Mouth/Throat:     Mouth: Mucous membranes are moist.     Pharynx: Oropharynx is clear.  Eyes:     Extraocular Movements: Extraocular movements intact.     Conjunctiva/sclera: Conjunctivae normal.     Pupils: Pupils are equal, round, and reactive to light.  Cardiovascular:     Rate and Rhythm: Normal rate.     Pulses: Normal pulses.     Heart sounds: Normal heart sounds.  Pulmonary:     Effort: Pulmonary effort is normal.     Breath sounds: Normal breath sounds.  Abdominal:     General: Abdomen is flat. Bowel sounds are normal.     Palpations: Abdomen is soft.  Musculoskeletal:        General: Normal range of motion.     Cervical back: Normal range of motion and neck supple.     Comments: Bounding pulses in left leg.  No discoloration or coldness to palpation.    Skin:    General: Skin is warm.     Capillary Refill: Capillary refill takes less than 2 seconds.  Neurological:     General: No focal deficit present.     Mental Status: He is alert and oriented to person, place, and time.  Psychiatric:        Mood and Affect: Mood normal.        Behavior: Behavior normal.     ED Results / Procedures / Treatments   Labs (all labs ordered are listed, but only abnormal results are displayed) Labs Reviewed  BASIC METABOLIC PANEL - Abnormal; Notable for the following components:      Result Value   Glucose, Bld 123 (*)    BUN 28 (*)    Creatinine, Ser 1.47 (*)    GFR, Estimated 54 (*)    All other components within normal limits  CBC WITH DIFFERENTIAL/PLATELET - Abnormal; Notable for the following components:   RBC 3.30 (*)    Hemoglobin 10.8 (*)    HCT 32.2 (*)    Platelets 144 (*)    All other components within normal limits  D-DIMER, QUANTITATIVE    EKG None  Radiology No results  found.  Procedures Procedures    Medications Ordered in ED Medications  dexamethasone (DECADRON) injection 10 mg (10 mg Intramuscular Given 06/25/23 1913)    ED Course/ Medical Decision Making/ A&P  Medical Decision Making Amount and/or Complexity of Data Reviewed Labs: ordered.  Risk Prescription drug management.   This patient presents to the ED for concern of leg pain, this involves an extensive number of treatment options, and is a complaint that carries with it a high risk of complications and morbidity.  The differential diagnosis includes dvt, arterial occlusion, sciatica   Co morbidities that complicate the patient evaluation   htn, hep c, hx syphilis, and ckd   Additional history obtained:  Additional history obtained from epic chart review  Lab Tests:  I Ordered, and personally interpreted labs.  The pertinent results include:  cbc with hgb 10.8 (chronic); bmp with cr 1.47 (chronic), and ddimer neg  Medicines ordered and prescription drug management:  I ordered medication including decadron  for sx  Reevaluation of the patient after these medicines showed that the patient improved I have reviewed the patients home medicines and have made adjustments as needed   Test Considered:  [place], but ddimer neg   Problem List / ED Course:  Left leg pain:  likely sciatica.  He has very good pulses.  He has a neg ddimer.  Pt d/c with prednisone and oxy.  Return if worse.  F/u with pcp.   Reevaluation:  After the interventions noted above, I reevaluated the patient and found that they have :improved   Social Determinants of Health:  Lives at home   Dispostion:  After consideration of the diagnostic results and the patients response to treatment, I feel that the patent would benefit from discharge with outpatient f/u.          Final Clinical Impression(s) / ED Diagnoses Final diagnoses:  Sciatica of left side    Rx  / DC Orders ED Discharge Orders          Ordered    predniSONE (DELTASONE) 50 MG tablet  Daily with breakfast        06/25/23 1945    oxyCODONE-acetaminophen (PERCOCET/ROXICET) 5-325 MG tablet  Every 6 hours PRN        06/25/23 1945              Jacalyn Lefevre, MD 06/25/23 1952

## 2023-06-25 NOTE — ED Triage Notes (Signed)
The pt is c/o his lt thigh feeling numb and a week and one half  no known injury lt lower leg feels ok

## 2023-06-28 ENCOUNTER — Emergency Department (HOSPITAL_COMMUNITY): Payer: Medicaid Other

## 2023-06-28 ENCOUNTER — Encounter (HOSPITAL_COMMUNITY): Payer: Self-pay

## 2023-06-28 ENCOUNTER — Emergency Department (HOSPITAL_COMMUNITY)
Admission: EM | Admit: 2023-06-28 | Discharge: 2023-06-29 | Disposition: A | Discharge status: Home / Self Care | Payer: Medicaid Other | Attending: Emergency Medicine | Admitting: Emergency Medicine

## 2023-06-28 DIAGNOSIS — Z87891 Personal history of nicotine dependence: Secondary | ICD-10-CM | POA: Diagnosis not present

## 2023-06-28 DIAGNOSIS — K5900 Constipation, unspecified: Secondary | ICD-10-CM | POA: Insufficient documentation

## 2023-06-28 DIAGNOSIS — I10 Essential (primary) hypertension: Secondary | ICD-10-CM | POA: Insufficient documentation

## 2023-06-28 DIAGNOSIS — J45909 Unspecified asthma, uncomplicated: Secondary | ICD-10-CM | POA: Insufficient documentation

## 2023-06-28 NOTE — ED Triage Notes (Signed)
Pt has been constipated x 1 day, states he has taken miralax and another stool softener today with no effect, do not express any abd pain, no nausea and no vomiting. Pt is otherwise stable at this time with no other complaints.

## 2023-06-28 NOTE — ED Provider Triage Note (Signed)
Emergency Medicine Provider Triage Evaluation Note  Shaun Morgan , a 63 y.o. male  was evaluated in triage.  Pt complains of constipation. Patient reports last BM was 7PM last night. Usually has 2-3 bowel movements a day. He feels the need to have a bowel movement but has not been able to go. Has tried Miralax and Bisacodyl this AM without relief.  Review of Systems  Positive: Constipation  Negative: Abdominal pain  Physical Exam  There were no vitals taken for this visit. Gen:   Awake, no distress   Resp:  Normal effort  MSK:   Moves extremities without difficulty  Other:    Medical Decision Making  Medically screening exam initiated at 7:59 PM.  Appropriate orders placed.  Shaun Morgan was informed that the remainder of the evaluation will be completed by another provider, this initial triage assessment does not replace that evaluation, and the importance of remaining in the ED until their evaluation is complete.   Maxwell Marion, PA-C 06/28/23 2109

## 2023-06-29 ENCOUNTER — Other Ambulatory Visit: Payer: Self-pay

## 2023-06-29 MED ORDER — BISACODYL 10 MG RE SUPP
10.0000 mg | RECTAL | 0 refills | Status: AC | PRN
Start: 2023-06-29 — End: ?

## 2023-06-29 MED ORDER — POLYETHYLENE GLYCOL 3350 17 G PO PACK: 17.0000 g | PACK | Freq: Every day | ORAL | 1 refills | Status: DC

## 2023-06-29 NOTE — Discharge Instructions (Addendum)
You were evaluated in the Emergency Department and after careful evaluation, we did not find any emergent condition requiring admission or further testing in the hospital.  Your exam/testing today was overall reassuring.  Symptoms likely due to constipation.  Recommend MiraLAX up to 6 times daily.  Recommend use of the suppositories as needed.  Also recommend use of enemas at home.  Please return to the Emergency Department if you experience any worsening of your condition.  Thank you for allowing [place] to be a part of your care.
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
## 2023-06-29 NOTE — ED Notes (Signed)
Rectal exam performed by Dr Pilar Plate with RN at bedside.

## 2023-06-30 DIAGNOSIS — Z743 Need for continuous supervision: Secondary | ICD-10-CM | POA: Diagnosis not present

## 2023-06-30 DIAGNOSIS — K4090 Unilateral inguinal hernia, without obstruction or gangrene, not specified as recurrent: Secondary | ICD-10-CM | POA: Diagnosis not present

## 2023-06-30 DIAGNOSIS — R1084 Generalized abdominal pain: Secondary | ICD-10-CM | POA: Diagnosis not present

## 2023-06-30 DIAGNOSIS — R0689 Other abnormalities of breathing: Secondary | ICD-10-CM | POA: Diagnosis not present

## 2023-06-30 DIAGNOSIS — R58 Hemorrhage, not elsewhere classified: Secondary | ICD-10-CM | POA: Diagnosis not present

## 2023-06-30 DIAGNOSIS — K7689 Other specified diseases of liver: Secondary | ICD-10-CM | POA: Diagnosis not present

## 2023-06-30 DIAGNOSIS — K6289 Other specified diseases of anus and rectum: Secondary | ICD-10-CM | POA: Diagnosis not present

## 2023-06-30 DIAGNOSIS — Z8546 Personal history of malignant neoplasm of prostate: Secondary | ICD-10-CM | POA: Diagnosis not present

## 2023-07-01 ENCOUNTER — Other Ambulatory Visit: Payer: Self-pay

## 2023-07-04 ENCOUNTER — Telehealth: Payer: Self-pay

## 2023-07-04 NOTE — Telephone Encounter (Signed)
[facility] Patient Advocate Encounter  Patient's medications Dorita Fray have been couriered to [facility] from [facility] and will be picked up at the patients appointment on 07/06/23.  Clearance Coots, CPhT Specialty Pharmacy Patient [facility] Phone: [phone] Fax:  [phone]

## 2023-07-04 NOTE — Progress Notes (Deleted)
   [facility] Vaccination Visit  HPI: Jayron Maqueda is a 63 y.o. male who presents to the [facility] pharmacy clinic for vaccine administration.  Hepatitis B Lab Results  Component Value Date   HEPBSAB NON-REACTIVE 04/14/2023   Lab Results  Component Value Date   HEPBSAG NON-REACTIVE 04/14/2023    Hepatitis C No results found for: HCVAB  Hepatitis A Lab Results  Component Value Date   HAV NON-REACTIVE 06/02/2023    Assessment & Plan: - Administered HBV 2/2 vaccine - Due for HAV 1/2; will defer - Recheck HCV RNA today  - Patient tolerated well - Follow up with me on 3/5 at end of treatment   Alan Geralds, PharmD, CPP, BCIDP, AAHIVP Clinical Pharmacist Practitioner Infectious Diseases Clinical Pharmacist [facility] 07/04/2023, 12:18 PM

## 2023-07-06 ENCOUNTER — Telehealth: Payer: Self-pay

## 2023-07-06 ENCOUNTER — Ambulatory Visit (INDEPENDENT_AMBULATORY_CARE_PROVIDER_SITE_OTHER): Payer: Medicaid Other | Admitting: Pharmacist

## 2023-07-06 ENCOUNTER — Other Ambulatory Visit: Payer: Self-pay

## 2023-07-06 DIAGNOSIS — B182 Chronic viral hepatitis C: Secondary | ICD-10-CM

## 2023-07-06 DIAGNOSIS — Z23 Encounter for immunization: Secondary | ICD-10-CM

## 2023-07-06 NOTE — Telephone Encounter (Signed)
 Medication was picked up at [facility] on  07/06/23 @ 3:38pm

## 2023-07-06 NOTE — Progress Notes (Signed)
   [facility] Vaccination Visit  HPI: Shaun Morgan is a 63 y.o. male who presents to the [facility] pharmacy clinic for vaccine administration.  Hepatitis B Lab Results  Component Value Date   HEPBSAB NON-REACTIVE 04/14/2023   Lab Results  Component Value Date   HEPBSAG NON-REACTIVE 04/14/2023    Hepatitis C No results found for: HCVAB  Hepatitis A Lab Results  Component Value Date   HAV NON-REACTIVE 06/02/2023   He is doing well on Epclusa . Missed 1 or 2 doses since last appointment. Discussed the risk with continued missed doses and asked that he try his best to not miss any more going forward for his last month. He picked up his final bottle here today. Agrees to start HAV vaccine series today as well.   Assessment & Plan: - HBV 2/2 given today - series complete - HAV 1/2 give today - next one due in 6 months - Recheck HCV RNA today  - Follow up with Alan on 3/5 at end of treatment   Cecila Satcher L. Alvilda Mckenna, PharmD, BCIDP, AAHIVP, CPP Clinical Pharmacist Practitioner Infectious Diseases Clinical Pharmacist [facility] 07/06/2023, 3:32 PM

## 2023-07-09 LAB — HEPATITIS C RNA QUANTITATIVE
HCV Quantitative Log: <1.18 {Log}
HCV RNA, PCR, QN: <15 [IU]/mL

## 2023-07-14 ENCOUNTER — Other Ambulatory Visit: Payer: Self-pay

## 2023-07-14 ENCOUNTER — Emergency Department (HOSPITAL_COMMUNITY): Payer: Medicaid Other

## 2023-07-14 ENCOUNTER — Encounter (HOSPITAL_COMMUNITY): Payer: Self-pay

## 2023-07-14 ENCOUNTER — Emergency Department (HOSPITAL_COMMUNITY)
Admission: EM | Admit: 2023-07-14 | Discharge: 2023-07-14 | Disposition: A | Discharge status: Home / Self Care | Payer: Medicaid Other | Attending: Emergency Medicine | Admitting: Emergency Medicine

## 2023-07-14 DIAGNOSIS — M25512 Pain in left shoulder: Secondary | ICD-10-CM | POA: Diagnosis not present

## 2023-07-14 DIAGNOSIS — Z79899 Other long term (current) drug therapy: Secondary | ICD-10-CM | POA: Insufficient documentation

## 2023-07-14 DIAGNOSIS — Z7951 Long term (current) use of inhaled steroids: Secondary | ICD-10-CM | POA: Insufficient documentation

## 2023-07-14 DIAGNOSIS — J45909 Unspecified asthma, uncomplicated: Secondary | ICD-10-CM | POA: Insufficient documentation

## 2023-07-14 DIAGNOSIS — I10 Essential (primary) hypertension: Secondary | ICD-10-CM | POA: Insufficient documentation

## 2023-07-14 DIAGNOSIS — M19012 Primary osteoarthritis, left shoulder: Secondary | ICD-10-CM | POA: Diagnosis not present

## 2023-07-14 MED ORDER — OXYCODONE HCL 5 MG PO TABS
5.0000 mg | ORAL_TABLET | Freq: Once | ORAL | Status: AC
  Administered 2023-07-14: 5 mg via ORAL
  Filled 2023-07-14: qty 1

## 2023-07-14 MED ORDER — OXYCODONE HCL 5 MG PO TABS
5.0000 mg | ORAL_TABLET | Freq: Four times a day (QID) | ORAL | 0 refills | Status: DC | PRN
  Filled 2023-07-14: qty 15, 4d supply, fill #0

## 2023-07-14 MED ORDER — KETOROLAC TROMETHAMINE 15 MG/ML IJ SOLN
30.0000 mg | Freq: Once | INTRAMUSCULAR | Status: AC
  Administered 2023-07-14: 30 mg via INTRAMUSCULAR
  Filled 2023-07-14: qty 2

## 2023-07-14 NOTE — Discharge Instructions (Signed)
You can continue to try heating pad on the shoulder and intermittent ibuprofen at home every 6 hours as needed.

## 2023-07-14 NOTE — ED Provider Triage Note (Signed)
Emergency Medicine Provider Triage Evaluation Note  Shaun Morgan , a 63 y.o. male  was evaluated in triage.  Pt complains of left shoulder pain that is "bone-on-bone".  States he is supposed to have surgery on it in March.  Has been getting cortisone shots, but thinks that the last cortisone shot may have worn off.  Denies any pain or swelling down into his arm.  Denies any numbness or tingling in the arm.  Denies any chest pain or shortness of breath.  Review of Systems  Positive: As above Negative: As above  Physical Exam  There were no vitals taken for this visit. Gen:   Awake, no distress   Resp:  Normal effort  MSK:   Moves extremities without difficulty  Other:  Radial pulse 2+ bilaterally, no edema of the upper extremities bilaterally  Medical Decision Making  Medically screening exam initiated at 5:52 PM.  Appropriate orders placed.  Shaun Morgan was informed that the remainder of the evaluation will be completed by another provider, this initial triage assessment does not replace that evaluation, and the importance of remaining in the ED until their evaluation is complete.     Arabella Merles, PA-C 07/14/23 1756

## 2023-07-14 NOTE — ED Triage Notes (Signed)
Scheudled for surgery march 5 for bone on bone in left shoulder.  Reports cortisone shot wore off and can't have any more due to scheduled surgery.  NO chest pain sob.

## 2023-07-14 NOTE — ED Provider Notes (Signed)
[place] EMERGENCY DEPARTMENT AT [facility] Provider Note   CSN: [id number] Arrival date & time: 07/14/23  1726     History  Chief Complaint  Patient presents with   Shoulder Pain    Shaun Morgan is a 63 y.o. male.  Patient is a 63 year old male with a history of asthma, hepatitis C, hypertension, known left shoulder issues with scheduled surgery at the beginning of March who had a cortisone shot 3 months ago and reports the shoulder was feeling better until yesterday the pain came back significantly.  He had a leftover oxycodone which he took last night which did help him sleep.  He denies any new injury.  No swelling to the shoulder and denies any fevers.  No new hand numbness or weakness.  The history is provided by the patient.  Shoulder Pain      Home Medications Prior to Admission medications   Medication Sig Start Date End Date Taking? Authorizing Provider  oxyCODONE (ROXICODONE) 5 MG immediate release tablet Take 1 tablet (5 mg total) by mouth every 6 (six) hours as needed for severe pain (pain score 7-10). 07/14/23  Yes Glorya Bartley, Alphonzo Lemmings, MD  albuterol (PROVENTIL HFA;VENTOLIN HFA) 108 (90 BASE) MCG/ACT inhaler Inhale 2 puffs into the lungs every 6 (six) hours as needed for wheezing or shortness of breath.    [provider]  bisacodyl (DULCOLAX) 10 MG suppository Place 1 suppository (10 mg total) rectally as needed for moderate constipation. 06/29/23   Sabas Sous, MD  bismuth subsalicylate (PEPTO-BISMOL) 262 MG chewable tablet Chew 2 tablets (524 mg total) by mouth as needed. 12/27/22   Bruning, Ashlyn, PA-C  Calcium Carb-Cholecalciferol (CALCIUM + VITAMIN D3) 600-10 MG-MCG TABS Take 1 tablet by mouth 2 (two) times daily with a meal. 12/27/22   Bruning, Ashlyn, PA-C  ciclopirox (PENLAC) 8 % solution Apply topically at bedtime. Apply over nail and surrounding skin. Apply daily over previous coat. After seven (7) days, may remove with alcohol and  continue cycle. 06/03/23   Vivi Barrack, DPM  clotrimazole-betamethasone (LOTRISONE) cream Apply 1 Application topically daily. 05/24/23   Clayborne Dana, NP  dicyclomine (BENTYL) 20 MG tablet Take 1 tablet (20 mg total) by mouth 2 (two) times daily. 06/11/23   Roxy Horseman, PA-C  famotidine (PEPCID) 20 MG tablet Take 1 tablet (20 mg total) by mouth 2 (two) times daily. 06/11/23   Roxy Horseman, PA-C  fluticasone-salmeterol (WIXELA INHUB) 100-50 MCG/ACT AEPB Inhale 1 puff into the lungs 2 (two) times daily. 04/06/23   Clayborne Dana, NP  gabapentin (NEURONTIN) 300 MG capsule Take 1 capsule (300 mg total) by mouth 3 (three) times daily. 06/06/23   Clayborne Dana, NP  hydrocortisone cream 1 % Apply 1 Application topically 2 (two) times daily. 12/27/22   Bruning, Ashlyn, PA-C  ketoconazole (NIZORAL) 2 % cream Apply 1 Application topically daily. 06/03/23   Vivi Barrack, DPM  lidocaine (XYLOCAINE) 5 % ointment Apply 1 Application topically as needed. 01/07/23   Linwood Dibbles, MD  losartan-hydrochlorothiazide (HYZAAR) 100-25 MG tablet Take 1 tablet by mouth daily.    [provider]  oxyCODONE-acetaminophen (PERCOCET/ROXICET) 5-325 MG tablet Take 1 tablet by mouth every 6 (six) hours as needed for severe pain (pain score 7-10). 06/25/23   Jacalyn Lefevre, MD  polyethylene glycol (MIRALAX) 17 g packet Take 17 g by mouth daily. 06/29/23   Sabas Sous, MD  predniSONE (DELTASONE) 50 MG tablet Take 1 tablet (50 mg total)  by mouth daily with breakfast. 06/25/23   Jacalyn Lefevre, MD  Sofosbuvir-Velpatasvir (EPCLUSA) 400-100 MG TABS Take 1 tablet by mouth daily. 05/03/23   Blanchard Kelch, NP  solifenacin (VESICARE) 5 MG tablet Take 1 tablet (5 mg total) by mouth at bedtime. 05/12/23   Stoneking, Danford Bad., MD  sucralfate (CARAFATE) 1 g tablet Take 1 tablet (1 g total) by mouth 4 (four) times daily -  with meals and at bedtime. 06/11/23   Roxy Horseman, PA-C  tamsulosin (FLOMAX) 0.4 MG CAPS  capsule Take 2 capsules (0.8 mg total) by mouth daily after supper. 04/12/23   Clayborne Dana, NP      Allergies    Acetaminophen    Review of Systems   Review of Systems  Physical Exam Updated Vital Signs BP (!) 153/87 (BP Location: Right Arm)   Pulse 90   Temp 98.1 F (36.7 C)   Resp 18   Ht 5\' 10"  (1.778 m)   Wt 99.8 kg   SpO2 97%   BMI 31.57 kg/m  Physical Exam Vitals and nursing note reviewed.  Cardiovascular:     Rate and Rhythm: Normal rate.  Pulmonary:     Effort: Pulmonary effort is normal.  Musculoskeletal:        General: Tenderness present.     Comments: Tenderness with palpation of the shoulder but full range of motion.  No warmth or swelling noted to the shoulder.  2+ radial pulse with 5 out of 5 handgrip strength normal wrist extension and flexion on the left.  Neurological:     Mental Status: He is alert. Mental status is at baseline.     ED Results / Procedures / Treatments   Labs (all labs ordered are listed, but only abnormal results are displayed) Labs Reviewed - No data to display  EKG None  Radiology DG Shoulder Left Result Date: 07/14/2023 CLINICAL DATA:  Pain. EXAM: LEFT SHOULDER - 3 VIEW COMPARISON:  None Available. FINDINGS: There is no evidence of fracture or dislocation. Glenohumeral and acromioclavicular degenerative change with narrowing and osteophyte formation. IMPRESSION: Degenerative changes.  No acute osseous abnormalities. Electronically Signed   By: Layla Maw M.D.   On: 07/14/2023 19:23    Procedures Procedures    Medications Ordered in ED Medications  ketorolac (TORADOL) 15 MG/ML injection 30 mg (has no administration in time range)  oxyCODONE (Oxy IR/ROXICODONE) immediate release tablet 5 mg (has no administration in time range)    ED Course/ Medical Decision Making/ A&P                                 Medical Decision Making Amount and/or Complexity of Data Reviewed Radiology: ordered and independent  interpretation performed. Decision-making details documented in ED Course.  Risk Prescription drug management.   Patient presenting with shoulder pain that seems to be exacerbation of his chronic pain.  He had a cortisone injection which was controlling his pain but now that has worn off and the pain has returned.  Plan for shoulder surgery the beginning of March.  No evidence of a septic shoulder at this time.  Neurovascularly intact.  Patient will be given pain control.  I have independently visualized and interpreted pt's images today. Shoulder image with degenerative changes but no acute issues.        Final Clinical Impression(s) / ED Diagnoses Final diagnoses:  Acute pain of left shoulder    Rx /  DC Orders ED Discharge Orders          Ordered    oxyCODONE (ROXICODONE) 5 MG immediate release tablet  Every 6 hours PRN        07/14/23 2149              Gwyneth Sprout, MD 07/14/23 2152

## 2023-07-15 ENCOUNTER — Other Ambulatory Visit (HOSPITAL_COMMUNITY): Payer: Self-pay

## 2023-07-19 ENCOUNTER — Ambulatory Visit (INDEPENDENT_AMBULATORY_CARE_PROVIDER_SITE_OTHER): Payer: Medicaid Other | Admitting: Family Medicine

## 2023-07-19 ENCOUNTER — Telehealth: Payer: Self-pay

## 2023-07-19 ENCOUNTER — Encounter: Payer: Self-pay | Admitting: Family Medicine

## 2023-07-19 VITALS — BP 136/76 | HR 97 | Ht 70.0 in | Wt 237.0 lb

## 2023-07-19 DIAGNOSIS — J4530 Mild persistent asthma, uncomplicated: Secondary | ICD-10-CM | POA: Diagnosis not present

## 2023-07-19 DIAGNOSIS — C61 Malignant neoplasm of prostate: Secondary | ICD-10-CM | POA: Diagnosis not present

## 2023-07-19 DIAGNOSIS — G8929 Other chronic pain: Secondary | ICD-10-CM

## 2023-07-19 DIAGNOSIS — I10 Essential (primary) hypertension: Secondary | ICD-10-CM | POA: Diagnosis not present

## 2023-07-19 DIAGNOSIS — M25512 Pain in left shoulder: Secondary | ICD-10-CM | POA: Diagnosis not present

## 2023-07-19 DIAGNOSIS — E782 Mixed hyperlipidemia: Secondary | ICD-10-CM | POA: Diagnosis not present

## 2023-07-19 LAB — CBC WITH DIFFERENTIAL/PLATELET
Basophils Absolute: 0 10*3/uL (ref 0.0–0.1)
Basophils Relative: 0.7 % (ref 0.0–3.0)
Eosinophils Absolute: 0.1 10*3/uL (ref 0.0–0.7)
Eosinophils Relative: 1 % (ref 0.0–5.0)
HCT: 32.4 % — ABNORMAL LOW (ref 39.0–52.0)
Hemoglobin: 10.9 g/dL — ABNORMAL LOW (ref 13.0–17.0)
Lymphocytes Relative: 18.2 % (ref 12.0–46.0)
Lymphs Abs: 0.9 10*3/uL (ref 0.7–4.0)
MCHC: 33.5 g/dL (ref 30.0–36.0)
MCV: 97.5 fL (ref 78.0–100.0)
Monocytes Absolute: 0.4 10*3/uL (ref 0.1–1.0)
Monocytes Relative: 8.5 % (ref 3.0–12.0)
Neutro Abs: 3.6 10*3/uL (ref 1.4–7.7)
Neutrophils Relative %: 71.6 % (ref 43.0–77.0)
Platelets: 151 10*3/uL (ref 150.0–400.0)
RBC: 3.33 Mil/uL — ABNORMAL LOW (ref 4.22–5.81)
RDW: 13.8 % (ref 11.5–15.5)
WBC: 5.1 10*3/uL (ref 4.0–10.5)

## 2023-07-19 LAB — LIPID PANEL
Cholesterol: 223 mg/dL — ABNORMAL HIGH (ref 0–200)
HDL: 86.8 mg/dL (ref >39.00)
LDL Cholesterol: 122 mg/dL — ABNORMAL HIGH (ref 0–99)
NonHDL: 135.95
Total CHOL/HDL Ratio: 3
Triglycerides: 68 mg/dL (ref 0.0–149.0)
VLDL: 13.6 mg/dL (ref 0.0–40.0)

## 2023-07-19 LAB — COMPREHENSIVE METABOLIC PANEL
ALT: 10 U/L (ref 0–53)
AST: 16 U/L (ref 0–37)
Albumin: 4 g/dL (ref 3.5–5.2)
Alkaline Phosphatase: 72 U/L (ref 39–117)
BUN: 18 mg/dL (ref 6–23)
CO2: 29 meq/L (ref 19–32)
Calcium: 9.2 mg/dL (ref 8.4–10.5)
Chloride: 103 meq/L (ref 96–112)
Creatinine, Ser: 1.33 mg/dL (ref 0.40–1.50)
GFR: 57.49 mL/min — ABNORMAL LOW (ref >60.00)
Glucose, Bld: 81 mg/dL (ref 70–99)
Potassium: 4.1 meq/L (ref 3.5–5.1)
Sodium: 140 meq/L (ref 135–145)
Total Bilirubin: 0.6 mg/dL (ref 0.2–1.2)
Total Protein: 7.5 g/dL (ref 6.0–8.3)

## 2023-07-19 NOTE — Telephone Encounter (Signed)
Copied from CRM [id number]. Topic: Clinical - Medication Question >> Jul 19, 2023  3:09 PM Corin V wrote: Reason for CRM: Patient takes lisinopril and hydrochlorothiazide 20mg -25mg  for his blood pressure. He stated that he was taking  famotidine 20 MG tablet by accident since the bottles looks the same.

## 2023-07-19 NOTE — Telephone Encounter (Signed)
 Updated medication list

## 2023-07-19 NOTE — Assessment & Plan Note (Signed)
Blood pressure is at goal for age and co-morbidities.   Recommendations: losartan-hctz 100-25 mg daily - BP goal <130/80 - monitor and log blood pressures at home - check around the same time each day in a relaxed setting - Limit salt to <2000 mg/day - Follow DASH eating plan (heart healthy diet) - limit alcohol to 2 standard drinks per day for men and 1 per day for women - avoid tobacco products - get at least 2 hours of regular aerobic exercise weekly Patient aware of signs/symptoms requiring further/urgent evaluation. Labs updated today. BLE edema - can't remember what he's eaten recently, but poor with recent birthday celebrations and has been drinking some alcohol. Supportive measures discussed - low sodium diet, minimize alcohol, try compression socks, leg elevation. Monitor. Keep upcoming cardiology appointment, come back here sooner if not improving.

## 2023-07-19 NOTE — Assessment & Plan Note (Signed)
 Following with oncology and urology (Dr. Pete Glatter) Treatment includes Eligard and tamsulosin 0.8 mg nightly Urology recommending ADT for 2 years due to high risk prostate cancer

## 2023-07-19 NOTE — Assessment & Plan Note (Signed)
Recent corticosteroid injection provided temporary relief. Severe pain returned, prompting ER visit. Patient considering surgery. -Advise patient to follow up with Orthopedic specialist to discuss further management options.

## 2023-07-19 NOTE — Assessment & Plan Note (Signed)
Patient was previously on cholesterol medication, but it was stopped due to Hepatitis C treatment. -Recheck lipid panel to assess current status.

## 2023-07-19 NOTE — Progress Notes (Signed)
Established Patient Office Visit  Subjective   Patient ID: Shaun Morgan, male    DOB: [birth year]  Age: 63 y.o. MRN: [id number]  Chief Complaint  Patient presents with   Medical Management of Chronic Issues    HPI  Patient is here for routine follow-up. No acute concerns.     Discussed the use of AI scribe software for clinical note transcription with the patient, who gave verbal consent to proceed.      Hypertension: - Medications: losartan-hctz 100-25 (pt unsure if this is accurate, he will check at home and call us back) - Compliance: good - Checking BP at home: rarely - Denies any SOB, recurrent headaches, CP, vision changes, dizziness, palpitations, or medication side effects. Notes some mild BLE edema today, but states he recently celebrated his birthday and did not eat great and was drinking alcohol more than usual. - Diet: general - Exercise: walking   Hyperlipidemia: - medications: avoiding statins while on Epclusa for Hep C - compliance: n/a - medication SEs: n/a The 10-year ASCVD risk score (Arnett DK, et al., 2019) is: 14.2%   Values used to calculate the score:     Age: 48 years     Sex: Male     Is Non-Hispanic African American: Yes     Diabetic: No     Tobacco smoker: No     Systolic Blood Pressure: 136 mmHg     Is BP treated: Yes     HDL Cholesterol: 86.8 mg/dL     Total Cholesterol: 223 mg/dL   Asthma - stable on Wixela and PRN albuterol    Prostate cancer: - Following with urology. Most recent PSA in December was undetectable   Hepatitis C: - Following with ID - Currently managed with Epclusa        ROS All review of systems negative except what is listed in the HPI    Objective:     BP 136/76   Pulse 97   Ht 5\' 10"  (1.778 m)   Wt 237 lb (107.5 kg)   SpO2 99%   BMI 34.01 kg/m    Physical Exam Vitals reviewed.  Constitutional:      Appearance: Normal appearance.  Cardiovascular:     Rate and Rhythm: Normal  rate and regular rhythm.     Heart sounds: Normal heart sounds.  Pulmonary:     Effort: Pulmonary effort is normal.     Breath sounds: Normal breath sounds.  Musculoskeletal:     Comments: BLE +1  Skin:    General: Skin is warm and dry.  Neurological:     Mental Status: He is alert and oriented to person, place, and time.  Psychiatric:        Mood and Affect: Mood normal.        Behavior: Behavior normal.        Thought Content: Thought content normal.        Judgment: Judgment normal.         The 10-year ASCVD risk score (Arnett DK, et al., 2019) is: 14.2%    Assessment & Plan:   Problem List Items Addressed This Visit       Active Problems   Malignant neoplasm of prostate [facility])   Following with oncology and urology (Dr. Pete Glatter) Treatment includes Eligard and tamsulosin 0.8 mg nightly Urology recommending ADT for 2 years due to high risk prostate cancer      Relevant Orders   CBC with Differential/Platelet (Completed)  Asthma   Stable since adding Wixela. PRN albuterol. -Patient aware of signs/symptoms requiring further/urgent evaluation.       Hypertension - Primary   Blood pressure is at goal for age and co-morbidities.   Recommendations: losartan-hctz 100-25 mg daily - BP goal <130/80 - monitor and log blood pressures at home - check around the same time each day in a relaxed setting - Limit salt to <2000 mg/day - Follow DASH eating plan (heart healthy diet) - limit alcohol to 2 standard drinks per day for men and 1 per day for women - avoid tobacco products - get at least 2 hours of regular aerobic exercise weekly Patient aware of signs/symptoms requiring further/urgent evaluation. Labs updated today. BLE edema - can't remember what he's eaten recently, but poor with recent birthday celebrations and has been drinking some alcohol. Supportive measures discussed - low sodium diet, minimize alcohol, try compression socks, leg elevation. Monitor. Keep  upcoming cardiology appointment, come back here sooner if not improving.       Relevant Orders   CBC with Differential/Platelet (Completed)   Comprehensive metabolic panel (Completed)   Chronic left shoulder pain   Recent corticosteroid injection provided temporary relief. Severe pain returned, prompting ER visit. Patient considering surgery. -Advise patient to follow up with Orthopedic specialist to discuss further management options.      Mixed hyperlipidemia   Patient was previously on cholesterol medication, but it was stopped due to Hepatitis C treatment. -Recheck lipid panel to assess current status.      Relevant Orders   CBC with Differential/Platelet (Completed)   Comprehensive metabolic panel (Completed)   Lipid panel (Completed)      Return in about 6 months (around 01/16/2024) for routine follow-up.    Clayborne Dana, NP

## 2023-07-19 NOTE — Assessment & Plan Note (Signed)
Stable since adding Wixela. PRN albuterol. -Patient aware of signs/symptoms requiring further/urgent evaluation.

## 2023-07-19 NOTE — Patient Instructions (Addendum)
[facility] for back/shoulder: [phone]

## 2023-07-21 ENCOUNTER — Emergency Department (HOSPITAL_COMMUNITY)
Admission: EM | Admit: 2023-07-21 | Discharge: 2023-07-21 | Disposition: A | Discharge status: Home / Self Care | Payer: Medicaid Other | Attending: Emergency Medicine | Admitting: Emergency Medicine

## 2023-07-21 ENCOUNTER — Encounter (HOSPITAL_COMMUNITY): Payer: Self-pay

## 2023-07-21 ENCOUNTER — Other Ambulatory Visit: Payer: Self-pay

## 2023-07-21 DIAGNOSIS — R600 Localized edema: Secondary | ICD-10-CM | POA: Insufficient documentation

## 2023-07-21 DIAGNOSIS — I10 Essential (primary) hypertension: Secondary | ICD-10-CM | POA: Diagnosis not present

## 2023-07-21 DIAGNOSIS — Z79899 Other long term (current) drug therapy: Secondary | ICD-10-CM | POA: Diagnosis not present

## 2023-07-21 LAB — COMPREHENSIVE METABOLIC PANEL
ALT: 13 U/L (ref 0–44)
AST: 19 U/L (ref 15–41)
Albumin: 3.7 g/dL (ref 3.5–5.0)
Alkaline Phosphatase: 62 U/L (ref 38–126)
Anion gap: 10 (ref 5–15)
BUN: 16 mg/dL (ref 8–23)
CO2: 26 mmol/L (ref 22–32)
Calcium: 10.1 mg/dL (ref 8.9–10.3)
Chloride: 105 mmol/L (ref 98–111)
Creatinine, Ser: 1.3 mg/dL — ABNORMAL HIGH (ref 0.61–1.24)
GFR, Estimated: >60 mL/min (ref >60)
Glucose, Bld: 95 mg/dL (ref 70–99)
Potassium: 4.7 mmol/L (ref 3.5–5.1)
Sodium: 141 mmol/L (ref 135–145)
Total Bilirubin: 0.8 mg/dL (ref 0.0–1.2)
Total Protein: 7.6 g/dL (ref 6.5–8.1)

## 2023-07-21 LAB — CBC
HCT: 34.6 % — ABNORMAL LOW (ref 39.0–52.0)
Hemoglobin: 11.3 g/dL — ABNORMAL LOW (ref 13.0–17.0)
MCH: 32.8 pg (ref 26.0–34.0)
MCHC: 32.7 g/dL (ref 30.0–36.0)
MCV: 100.3 fL — ABNORMAL HIGH (ref 80.0–100.0)
Platelets: 144 10*3/uL — ABNORMAL LOW (ref 150–400)
RBC: 3.45 MIL/uL — ABNORMAL LOW (ref 4.22–5.81)
RDW: 12.5 % (ref 11.5–15.5)
WBC: 4 10*3/uL (ref 4.0–10.5)
nRBC: 0 % (ref 0.0–0.2)

## 2023-07-21 LAB — BRAIN NATRIURETIC PEPTIDE: B Natriuretic Peptide: 13.4 pg/mL (ref 0.0–100.0)

## 2023-07-21 NOTE — ED Triage Notes (Addendum)
Pt c/o bilateral lower leg swelling x 3 days and shooting pain down entirety of both legs while ambulating; denies cp, dizziness, or sob; hx HTN, Hep C, neuropathy; denies CHF hx

## 2023-07-21 NOTE — Discharge Instructions (Signed)
Try to elevate your legs above you heart when you are not up and moving around.  Try compression stockings.  Follow up with your doctor in the office.

## 2023-07-21 NOTE — ED Notes (Signed)
 Lab to add on BNP.

## 2023-07-21 NOTE — ED Provider Notes (Signed)
[place] EMERGENCY DEPARTMENT AT [facility] Provider Note   CSN: [id number] Arrival date & time: 07/21/23  1103     History  No chief complaint on file.   Shaun Morgan is a 63 y.o. male.  63 yo M with a chief complaints of bilateral lower extremity edema.  Going on for about 3 days now.  Has some pain with ambulation.  Denies history of swelling.  Has a history of hep C but is on therapy and not thought to have cirrhosis.  No history of heart failure.  No problems with his kidneys.  Feels like he has been eating and drinking normally.  He has unfortunately been sleeping in his car.        Home Medications Prior to Admission medications   Medication Sig Start Date End Date Taking? Authorizing Provider  albuterol (PROVENTIL HFA;VENTOLIN HFA) 108 (90 BASE) MCG/ACT inhaler Inhale 2 puffs into the lungs every 6 (six) hours as needed for wheezing or shortness of breath.    [provider]  bisacodyl (DULCOLAX) 10 MG suppository Place 1 suppository (10 mg total) rectally as needed for moderate constipation. 06/29/23   Sabas Sous, MD  bismuth subsalicylate (PEPTO-BISMOL) 262 MG chewable tablet Chew 2 tablets (524 mg total) by mouth as needed. 12/27/22   Bruning, Ashlyn, PA-C  Calcium Carb-Cholecalciferol (CALCIUM + VITAMIN D3) 600-10 MG-MCG TABS Take 1 tablet by mouth 2 (two) times daily with a meal. 12/27/22   Bruning, Ashlyn, PA-C  ciclopirox (PENLAC) 8 % solution Apply topically at bedtime. Apply over nail and surrounding skin. Apply daily over previous coat. After seven (7) days, may remove with alcohol and continue cycle. 06/03/23   Vivi Barrack, DPM  clotrimazole-betamethasone (LOTRISONE) cream Apply 1 Application topically daily. 05/24/23   Clayborne Dana, NP  dicyclomine (BENTYL) 20 MG tablet Take 1 tablet (20 mg total) by mouth 2 (two) times daily. 06/11/23   Roxy Horseman, PA-C  famotidine (PEPCID) 20 MG tablet Take 1 tablet (20 mg total) by  mouth 2 (two) times daily. 06/11/23   Roxy Horseman, PA-C  fluticasone-salmeterol (WIXELA INHUB) 100-50 MCG/ACT AEPB Inhale 1 puff into the lungs 2 (two) times daily. 04/06/23   Clayborne Dana, NP  gabapentin (NEURONTIN) 300 MG capsule Take 1 capsule (300 mg total) by mouth 3 (three) times daily. 06/06/23   Clayborne Dana, NP  hydrocortisone cream 1 % Apply 1 Application topically 2 (two) times daily. 12/27/22   Bruning, Ashlyn, PA-C  ketoconazole (NIZORAL) 2 % cream Apply 1 Application topically daily. 06/03/23   Vivi Barrack, DPM  lidocaine (XYLOCAINE) 5 % ointment Apply 1 Application topically as needed. 01/07/23   Linwood Dibbles, MD  lisinopril-hydrochlorothiazide (ZESTORETIC) 20-25 MG tablet Take 1 tablet by mouth daily.    [provider]  oxyCODONE (ROXICODONE) 5 MG immediate release tablet Take 1 tablet (5 mg total) by mouth every 6 (six) hours as needed for severe pain (pain score 7-10). 07/14/23   Gwyneth Sprout, MD  oxyCODONE-acetaminophen (PERCOCET/ROXICET) 5-325 MG tablet Take 1 tablet by mouth every 6 (six) hours as needed for severe pain (pain score 7-10). 06/25/23   Jacalyn Lefevre, MD  polyethylene glycol (MIRALAX) 17 g packet Take 17 g by mouth daily. 06/29/23   Sabas Sous, MD  Sofosbuvir-Velpatasvir (EPCLUSA) 400-100 MG TABS Take 1 tablet by mouth daily. 05/03/23   Blanchard Kelch, NP  solifenacin (VESICARE) 5 MG tablet Take 1 tablet (5 mg total) by mouth at  bedtime. 05/12/23   Stoneking, Danford Bad., MD  sucralfate (CARAFATE) 1 g tablet Take 1 tablet (1 g total) by mouth 4 (four) times daily -  with meals and at bedtime. 06/11/23   Roxy Horseman, PA-C  tamsulosin (FLOMAX) 0.4 MG CAPS capsule Take 2 capsules (0.8 mg total) by mouth daily after supper. 04/12/23   Clayborne Dana, NP      Allergies    Acetaminophen    Review of Systems   Review of Systems  Physical Exam Updated Vital Signs BP (!) 136/96   Pulse 78   Temp 98.1 F (36.7 C)   Resp 18   Wt 107.5  kg   SpO2 97%   BMI 34.01 kg/m  Physical Exam Vitals and nursing note reviewed.  Constitutional:      Appearance: He is well-developed.  HENT:     Head: Normocephalic and atraumatic.  Eyes:     Pupils: Pupils are equal, round, and reactive to light.  Neck:     Vascular: No JVD.  Cardiovascular:     Rate and Rhythm: Normal rate and regular rhythm.     Heart sounds: No murmur heard.    No friction rub. No gallop.  Pulmonary:     Effort: No respiratory distress.     Breath sounds: No wheezing.  Abdominal:     General: There is no distension.     Tenderness: There is no abdominal tenderness. There is no guarding or rebound.  Musculoskeletal:        General: Normal range of motion.     Cervical back: Normal range of motion and neck supple.     Right lower leg: Edema present.     Left lower leg: Edema present.     Comments: 1+ lower extremity edema bilaterally up to the shins.  Skin:    Coloration: Skin is not pale.     Findings: No rash.  Neurological:     Mental Status: He is alert and oriented to person, place, and time.  Psychiatric:        Behavior: Behavior normal.     ED Results / Procedures / Treatments   Labs (all labs ordered are listed, but only abnormal results are displayed) Labs Reviewed  COMPREHENSIVE METABOLIC PANEL - Abnormal; Notable for the following components:      Result Value   Creatinine, Ser 1.30 (*)    All other components within normal limits  CBC - Abnormal; Notable for the following components:   RBC 3.45 (*)    Hemoglobin 11.3 (*)    HCT 34.6 (*)    MCV 100.3 (*)    Platelets 144 (*)    All other components within normal limits  BRAIN NATRIURETIC PEPTIDE    EKG None  Radiology No results found.  Procedures Procedures    Medications Ordered in ED Medications - No data to display  ED Course/ Medical Decision Making/ A&P                                 Medical Decision Making Amount and/or Complexity of Data  Reviewed Labs: ordered.   63 yo M with a chief complaint of bilateral lower extremity edema.  This has been going on for a few days now.  Coincided with when he started sleeping in his car.  Suspect this is dependent edema.  His LFTs are unremarkable renal function is normal.  BNP is negative.  Will treat supportively.  Discussed things he could try at home.  Encourage PCP follow-up.  4:48 PM:  I have discussed the diagnosis/risks/treatment options with the patient.  Evaluation and diagnostic testing in the emergency department does not suggest an emergent condition requiring admission or immediate intervention beyond what has been performed at this time.  They will follow up with PCP. We also discussed returning to the ED immediately if new or worsening sx occur. We discussed the sx which are most concerning (e.g., sudden worsening pain, fever, inability to tolerate by mouth) that necessitate immediate return. Medications administered to the patient during their visit and any new prescriptions provided to the patient are listed below.  Medications given during this visit Medications - No data to display   The patient appears reasonably screen and/or stabilized for discharge and I doubt any other medical condition or other [facility] requiring further screening, evaluation, or treatment in the ED at this time prior to discharge.          Final Clinical Impression(s) / ED Diagnoses Final diagnoses:  Bilateral lower extremity edema    Rx / DC Orders ED Discharge Orders     None         Melene Plan, DO 07/21/23 1648

## 2023-07-21 NOTE — ED Notes (Signed)
Patient discharged by this RN. Ambulatory to lobby at time of discharge.

## 2023-07-24 ENCOUNTER — Other Ambulatory Visit: Payer: Self-pay

## 2023-07-24 ENCOUNTER — Encounter (HOSPITAL_COMMUNITY): Payer: Self-pay | Admitting: Emergency Medicine

## 2023-07-24 ENCOUNTER — Emergency Department (HOSPITAL_COMMUNITY)
Admission: EM | Admit: 2023-07-24 | Discharge: 2023-07-25 | Disposition: A | Discharge status: Home / Self Care | Payer: Medicaid Other | Attending: Emergency Medicine | Admitting: Emergency Medicine

## 2023-07-24 DIAGNOSIS — Z8546 Personal history of malignant neoplasm of prostate: Secondary | ICD-10-CM | POA: Diagnosis not present

## 2023-07-24 DIAGNOSIS — D649 Anemia, unspecified: Secondary | ICD-10-CM | POA: Diagnosis not present

## 2023-07-24 DIAGNOSIS — K6289 Other specified diseases of anus and rectum: Secondary | ICD-10-CM | POA: Diagnosis not present

## 2023-07-24 DIAGNOSIS — S3210XD Unspecified fracture of sacrum, subsequent encounter for fracture with routine healing: Secondary | ICD-10-CM | POA: Diagnosis not present

## 2023-07-24 DIAGNOSIS — Z79899 Other long term (current) drug therapy: Secondary | ICD-10-CM | POA: Insufficient documentation

## 2023-07-24 DIAGNOSIS — T185XXA Foreign body in anus and rectum, initial encounter: Secondary | ICD-10-CM | POA: Diagnosis not present

## 2023-07-24 LAB — URINALYSIS, W/ REFLEX TO CULTURE (INFECTION SUSPECTED)
Bacteria, UA: NONE SEEN
Bilirubin Urine: NEGATIVE
Glucose, UA: NEGATIVE mg/dL
Hgb urine dipstick: NEGATIVE
Ketones, ur: NEGATIVE mg/dL
Leukocytes,Ua: NEGATIVE
Nitrite: NEGATIVE
Protein, ur: NEGATIVE mg/dL
Specific Gravity, Urine: 1.013 (ref 1.005–1.030)
pH: 5 (ref 5.0–8.0)

## 2023-07-24 NOTE — ED Triage Notes (Signed)
 Pt in with rectal pain and reports, "something white is hanging out of my rectum". Pt states he first noticed it yesterday. Thinks this is r/t prostate surgery done April 24', and he was told the material would dissolve 62mo's post-surgery. Denies any rectal bleeding or inability to have BM

## 2023-07-24 NOTE — ED Provider Triage Note (Signed)
 Emergency Medicine Provider Triage Evaluation Note  Winnie Barsky , a 63 y.o. male  was evaluated in triage.  Pt complains of rectal foreign body.  Review of Systems  Positive:  Negative:   Physical Exam  BP 135/85 (BP Location: Right Arm)   Pulse (!) 102   Temp 98.2 F (36.8 C) (Oral)   Resp 16   Wt 107.5 kg   SpO2 97%   BMI 34.01 kg/m  Gen:   Awake, no distress   Resp:  Normal effort  MSK:   Moves extremities without difficulty  Other:    Medical Decision Making  Medically screening exam initiated at 7:59 PM.  Appropriate orders placed.  Khristian Phillippi Babler was informed that the remainder of the evaluation will be completed by another provider, this initial triage assessment does not replace that evaluation, and the importance of remaining in the ED until their evaluation is complete.  Patient had prostate cancer surgery 6 months ago and was told that he would poop blood at the 6 month mark. Patient did poop blood last week and felt fine. Patient then had difficulty pooping today and felt like something is hanging out of his rectum so he took a picture and saw something white coming out of his rectum. Patient has since deleted this picture...  Patient's only other complaint is intermittent dysuria.   Dorthy Cooler, [place] 07/24/23 2001

## 2023-07-24 NOTE — ED Provider Notes (Signed)
 [place] EMERGENCY DEPARTMENT AT [facility] Provider Note   CSN: [id number] Arrival date & time: 07/24/23  1900     History {Add pertinent medical, surgical, social history, OB history to HPI:1} Chief Complaint  Patient presents with   Rectal Pain    Shaun Morgan is a 63 y.o. male.  Patient with a history of prostate cancer 10 months ago status post radiation here with rectal discomfort.  States he was attempting have a BM today and was not successful but afterwards he saw a piece of "white fatty tissue" hanging out of his rectum.  There is no bleeding.  He took a picture of this on his phone but has since deleted it.  He still having discomfort in his rectum but no pain or bleeding.  He states he thinks is related to his prostate surgery that he had in April 2024.  He was told he would have some bleeding 6 months after his surgery which she did but he has not had any blood in his stool for several weeks.  Denies being constipated or straining.  No vomiting or fever.  No chest pain or shortness of breath.  No abdominal pain.  Feels well now.  No blood thinner use.  The history is provided by the patient.       Home Medications Prior to Admission medications   Medication Sig Start Date End Date Taking? Authorizing Provider  albuterol (PROVENTIL HFA;VENTOLIN HFA) 108 (90 BASE) MCG/ACT inhaler Inhale 2 puffs into the lungs every 6 (six) hours as needed for wheezing or shortness of breath.    [provider]  bisacodyl (DULCOLAX) 10 MG suppository Place 1 suppository (10 mg total) rectally as needed for moderate constipation. 06/29/23   Sabas Sous, MD  bismuth subsalicylate (PEPTO-BISMOL) 262 MG chewable tablet Chew 2 tablets (524 mg total) by mouth as needed. 12/27/22   Bruning, Ashlyn, PA-C  Calcium Carb-Cholecalciferol (CALCIUM + VITAMIN D3) 600-10 MG-MCG TABS Take 1 tablet by mouth 2 (two) times daily with a meal. 12/27/22   Bruning, Ashlyn, PA-C   ciclopirox (PENLAC) 8 % solution Apply topically at bedtime. Apply over nail and surrounding skin. Apply daily over previous coat. After seven (7) days, may remove with alcohol and continue cycle. 06/03/23   Vivi Barrack, DPM  clotrimazole-betamethasone (LOTRISONE) cream Apply 1 Application topically daily. 05/24/23   Clayborne Dana, NP  dicyclomine (BENTYL) 20 MG tablet Take 1 tablet (20 mg total) by mouth 2 (two) times daily. 06/11/23   Roxy Horseman, PA-C  famotidine (PEPCID) 20 MG tablet Take 1 tablet (20 mg total) by mouth 2 (two) times daily. 06/11/23   Roxy Horseman, PA-C  fluticasone-salmeterol (WIXELA INHUB) 100-50 MCG/ACT AEPB Inhale 1 puff into the lungs 2 (two) times daily. 04/06/23   Clayborne Dana, NP  gabapentin (NEURONTIN) 300 MG capsule Take 1 capsule (300 mg total) by mouth 3 (three) times daily. 06/06/23   Clayborne Dana, NP  hydrocortisone cream 1 % Apply 1 Application topically 2 (two) times daily. 12/27/22   Bruning, Ashlyn, PA-C  ketoconazole (NIZORAL) 2 % cream Apply 1 Application topically daily. 06/03/23   Vivi Barrack, DPM  lidocaine (XYLOCAINE) 5 % ointment Apply 1 Application topically as needed. 01/07/23   Linwood Dibbles, MD  lisinopril-hydrochlorothiazide (ZESTORETIC) 20-25 MG tablet Take 1 tablet by mouth daily.    [provider]  oxyCODONE (ROXICODONE) 5 MG immediate release tablet Take 1 tablet (5 mg total) by mouth every  6 (six) hours as needed for severe pain (pain score 7-10). 07/14/23   Gwyneth Sprout, MD  oxyCODONE-acetaminophen (PERCOCET/ROXICET) 5-325 MG tablet Take 1 tablet by mouth every 6 (six) hours as needed for severe pain (pain score 7-10). 06/25/23   Jacalyn Lefevre, MD  polyethylene glycol (MIRALAX) 17 g packet Take 17 g by mouth daily. 06/29/23   Sabas Sous, MD  Sofosbuvir-Velpatasvir (EPCLUSA) 400-100 MG TABS Take 1 tablet by mouth daily. 05/03/23   Blanchard Kelch, NP  solifenacin (VESICARE) 5 MG tablet Take 1 tablet (5 mg total)  by mouth at bedtime. 05/12/23   Stoneking, Danford Bad., MD  sucralfate (CARAFATE) 1 g tablet Take 1 tablet (1 g total) by mouth 4 (four) times daily -  with meals and at bedtime. 06/11/23   Roxy Horseman, PA-C  tamsulosin (FLOMAX) 0.4 MG CAPS capsule Take 2 capsules (0.8 mg total) by mouth daily after supper. 04/12/23   Clayborne Dana, NP      Allergies    Acetaminophen    Review of Systems   Review of Systems  Constitutional:  Negative for activity change, appetite change and fever.  HENT:  Negative for congestion and rhinorrhea.   Respiratory:  Negative for cough, chest tightness and shortness of breath.   Cardiovascular:  Negative for chest pain.  Gastrointestinal:  Negative for abdominal pain, nausea and vomiting.  Genitourinary:  Negative for dysuria and hematuria.  Musculoskeletal:  Negative for arthralgias and myalgias.  Skin:  Negative for rash.  Neurological:  Negative for dizziness, weakness and headaches.   all other systems are negative except as noted in the HPI and PMH.    Physical Exam Updated Vital Signs BP 135/85 (BP Location: Right Arm)   Pulse (!) 102   Temp 98.2 F (36.8 C) (Oral)   Resp 16   Wt 107.5 kg   SpO2 97%   BMI 34.01 kg/m  Physical Exam Vitals and nursing note reviewed.  Constitutional:      General: He is not in acute distress.    Appearance: He is well-developed.  HENT:     Head: Normocephalic and atraumatic.     Mouth/Throat:     Pharynx: No oropharyngeal exudate.  Eyes:     Conjunctiva/sclera: Conjunctivae normal.     Pupils: Pupils are equal, round, and reactive to light.  Neck:     Comments: No meningismus. Cardiovascular:     Rate and Rhythm: Normal rate and regular rhythm.     Heart sounds: Normal heart sounds. No murmur heard. Pulmonary:     Effort: Pulmonary effort is normal. No respiratory distress.     Breath sounds: Normal breath sounds.  Abdominal:     Palpations: Abdomen is soft.     Tenderness: There is no abdominal  tenderness. There is no guarding or rebound.  Genitourinary:    Comments: Perianal exam is normal to inspection.  There is no protruding tissue from the anus.  No hemorrhoid.  No bleeding.  No pain to palpation of digital finger exam.  No gross blood per Musculoskeletal:        General: No tenderness. Normal range of motion.     Cervical back: Normal range of motion and neck supple.  Skin:    General: Skin is warm.  Neurological:     Mental Status: He is alert and oriented to person, place, and time.     Cranial Nerves: No cranial nerve deficit.     Motor: No abnormal muscle tone.  Coordination: Coordination normal.     Comments:  5/5 strength throughout. CN 2-12 intact.Equal grip strength.   Psychiatric:        Behavior: Behavior normal.     ED Results / Procedures / Treatments   Labs (all labs ordered are listed, but only abnormal results are displayed) Labs Reviewed  URINALYSIS, W/ REFLEX TO CULTURE (INFECTION SUSPECTED)  CBC WITH DIFFERENTIAL/PLATELET  COMPREHENSIVE METABOLIC PANEL    EKG None  Radiology No results found.  Procedures Procedures  {Document cardiac monitor, telemetry assessment procedure when appropriate:1}  Medications Ordered in ED Medications - No data to display  ED Course/ Medical Decision Making/ A&P   {   Click here for ABCD2, HEART and other calculatorsREFRESH Note before signing :1}                              Medical Decision Making Amount and/or Complexity of Data Reviewed Labs: ordered. Decision-making details documented in ED Course. Radiology: ordered and independent interpretation performed. Decision-making details documented in ED Course. ECG/medicine tests: ordered and independent interpretation performed. Decision-making details documented in ED Course.   Foreign body sensation in her rectum.  None visible clinically.  Stable vitals.  Abdomen soft without peritoneal signs.  {Document critical care time when  appropriate:1} {Document review of labs and clinical decision tools ie heart score, Chads2Vasc2 etc:1}  {Document your independent review of radiology images, and any outside records:1} {Document your discussion with family members, caretakers, and with consultants:1} {Document social determinants of health affecting pt's care:1} {Document your decision making why or why not admission, treatments were needed:1} Final Clinical Impression(s) / ED Diagnoses Final diagnoses:  None    Rx / DC Orders ED Discharge Orders     None

## 2023-07-25 ENCOUNTER — Emergency Department (HOSPITAL_COMMUNITY): Payer: Medicaid Other

## 2023-07-25 DIAGNOSIS — T185XXA Foreign body in anus and rectum, initial encounter: Secondary | ICD-10-CM | POA: Diagnosis not present

## 2023-07-25 DIAGNOSIS — S3210XD Unspecified fracture of sacrum, subsequent encounter for fracture with routine healing: Secondary | ICD-10-CM | POA: Diagnosis not present

## 2023-07-25 LAB — CBC WITH DIFFERENTIAL/PLATELET
Abs Immature Granulocytes: 0.01 10*3/uL (ref 0.00–0.07)
Basophils Absolute: 0 10*3/uL (ref 0.0–0.1)
Basophils Relative: 1 %
Eosinophils Absolute: 0.1 10*3/uL (ref 0.0–0.5)
Eosinophils Relative: 2 %
HCT: 33 % — ABNORMAL LOW (ref 39.0–52.0)
Hemoglobin: 10.6 g/dL — ABNORMAL LOW (ref 13.0–17.0)
Immature Granulocytes: 0 %
Lymphocytes Relative: 24 %
Lymphs Abs: 1.3 10*3/uL (ref 0.7–4.0)
MCH: 31.8 pg (ref 26.0–34.0)
MCHC: 32.1 g/dL (ref 30.0–36.0)
MCV: 99.1 fL (ref 80.0–100.0)
Monocytes Absolute: 0.7 10*3/uL (ref 0.1–1.0)
Monocytes Relative: 13 %
Neutro Abs: 3.1 10*3/uL (ref 1.7–7.7)
Neutrophils Relative %: 60 %
Platelets: 144 10*3/uL — ABNORMAL LOW (ref 150–400)
RBC: 3.33 MIL/uL — ABNORMAL LOW (ref 4.22–5.81)
RDW: 12.6 % (ref 11.5–15.5)
WBC: 5.2 10*3/uL (ref 4.0–10.5)
nRBC: 0 % (ref 0.0–0.2)

## 2023-07-25 LAB — COMPREHENSIVE METABOLIC PANEL
ALT: 14 U/L (ref 0–44)
AST: 21 U/L (ref 15–41)
Albumin: 3.6 g/dL (ref 3.5–5.0)
Alkaline Phosphatase: 62 U/L (ref 38–126)
Anion gap: 9 (ref 5–15)
BUN: 28 mg/dL — ABNORMAL HIGH (ref 8–23)
CO2: 24 mmol/L (ref 22–32)
Calcium: 9.5 mg/dL (ref 8.9–10.3)
Chloride: 104 mmol/L (ref 98–111)
Creatinine, Ser: 1.59 mg/dL — ABNORMAL HIGH (ref 0.61–1.24)
GFR, Estimated: 49 mL/min — ABNORMAL LOW (ref >60)
Glucose, Bld: 95 mg/dL (ref 70–99)
Potassium: 3.9 mmol/L (ref 3.5–5.1)
Sodium: 137 mmol/L (ref 135–145)
Total Bilirubin: 0.6 mg/dL (ref 0.0–1.2)
Total Protein: 7.2 g/dL (ref 6.5–8.1)

## 2023-07-25 MED ORDER — IOHEXOL 350 MG/ML SOLN
75.0000 mL | Freq: Once | INTRAVENOUS | Status: AC | PRN
  Administered 2023-07-25: 75 mL via INTRAVENOUS

## 2023-07-25 NOTE — Discharge Instructions (Signed)
 Your workup did not show significant abnormality and no foreign body in your rectum.  Uncertain what you saw but you can follow-up with a gastroenterologist for further evaluation.  Return to the ED with new or worsening symptoms.

## 2023-07-25 NOTE — ED Notes (Signed)
Pt verbalized understanding of discharge instructions. Pt ambulated from ed with steady gait.  

## 2023-07-26 ENCOUNTER — Other Ambulatory Visit (HOSPITAL_COMMUNITY): Payer: Self-pay

## 2023-07-26 ENCOUNTER — Other Ambulatory Visit: Payer: Self-pay

## 2023-07-26 NOTE — Progress Notes (Signed)
 12 weeks of therapy has been dispensed. Last appt with [facility] is 08/09/23. Removed refill and clinical sequence. [facility] will disenroll at wrap up.

## 2023-08-03 ENCOUNTER — Ambulatory Visit: Payer: Medicaid Other | Admitting: Pharmacist

## 2023-08-05 NOTE — Progress Notes (Signed)
 HPI: Shaun Morgan is a 63 y.o. male who presents to the [facility] pharmacy clinic for Hepatitis C follow-up.  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   BISACODYL (DULCOLAX) 10 MG SUPPOSITORY    Place 1 suppository (10 mg total) rectally as needed for moderate constipation.   BISMUTH SUBSALICYLATE (PEPTO-BISMOL) 262 MG CHEWABLE TABLET    Chew 2 tablets (524 mg total) by mouth as needed.   CALCIUM CARB-CHOLECALCIFEROL (CALCIUM + VITAMIN D3) 600-10 MG-MCG TABS    Take 1 tablet by mouth 2 (two) times daily with a meal.   CICLOPIROX (PENLAC) 8 % SOLUTION    Apply topically at bedtime. Apply over nail and surrounding skin. Apply daily over previous coat. After seven (7) days, may remove with alcohol and continue cycle.   CLOTRIMAZOLE-BETAMETHASONE (LOTRISONE) CREAM    Apply 1 Application topically daily.   DICYCLOMINE (BENTYL) 20 MG TABLET    Take 1 tablet (20 mg total) by mouth 2 (two) times daily.   FAMOTIDINE (PEPCID) 20 MG TABLET    Take 1 tablet (20 mg total) by mouth 2 (two) times daily.   FLUTICASONE-SALMETEROL (WIXELA INHUB) 100-50 MCG/ACT AEPB    Inhale 1 puff into the  lungs 2 (two) times daily.   GABAPENTIN (NEURONTIN) 300 MG CAPSULE    Take 1 capsule (300 mg total) by mouth 3 (three) times daily.   HYDROCORTISONE CREAM 1 %    Apply 1 Application topically 2 (two) times daily.   KETOCONAZOLE (NIZORAL) 2 % CREAM    Apply 1 Application topically daily.   LIDOCAINE (XYLOCAINE) 5 % OINTMENT    Apply 1 Application topically as needed.   LISINOPRIL-HYDROCHLOROTHIAZIDE (ZESTORETIC) 20-25 MG TABLET    Take 1 tablet by mouth daily.   OXYCODONE (ROXICODONE) 5 MG IMMEDIATE RELEASE TABLET    Take 1 tablet (5 mg total) by mouth every 6 (six) hours as needed for severe pain (pain score 7-10).   OXYCODONE-ACETAMINOPHEN (PERCOCET/ROXICET) 5-325 MG TABLET    Take 1 tablet by mouth every 6 (six) hours as needed for severe pain (pain score 7-10).   POLYETHYLENE GLYCOL (MIRALAX) 17 G PACKET    Take 17 g by mouth daily.   SOFOSBUVIR-VELPATASVIR (EPCLUSA) 400-100 MG TABS    Take 1 tablet by mouth daily.   SOLIFENACIN (VESICARE) 5 MG TABLET    Take 1 tablet (5 mg total) by mouth at bedtime.   SUCRALFATE (CARAFATE) 1 G TABLET    Take 1 tablet (1 g total) by mouth 4 (four) times daily -  with meals and at bedtime.   TAMSULOSIN (FLOMAX) 0.4 MG CAPS CAPSULE    Take 2  capsules (0.8 mg total) by mouth daily after supper.  Modified Medications   No medications on file  Discontinued Medications   No medications on file    Allergies: Allergies  Allergen Reactions   Acetaminophen Other (See Comments)    Hep C    Past Medical History: Past Medical History:  Diagnosis Date   Asthma    Hepatitis C    per detention nurse   Hypertension    Neuropathy    Syphilis    per detention nurse   Wears partial dentures    upper/lower    Social History: Social History   Socioeconomic History   Marital status: Single    Spouse name: Not on file   Number of children: Not on file   Years of education: Not on file   Highest education level: Not on file  Occupational History   Not on  file  Tobacco Use   Smoking status: Former    Current packs/day: 0.00    Types: Cigarettes, E-cigarettes, Pipe    Quit date: 10/2021    Years since quitting: 1.7   Smokeless tobacco: Not on file   Tobacco comments:    Patient incarcerated 10/2021, no nicotine products allowed since incarceration per detention center nurse        05/02/23- patient vapes non nocotine   Vaping Use   Vaping status: Never Used  Substance and Sexual Activity   Alcohol use: Yes    Alcohol/week: 6.0 standard drinks of alcohol    Types: 6 Standard drinks or equivalent per week   Drug use: Never   Sexual activity: Not Currently  Other Topics Concern   Not on file  Social History Narrative   ** Merged History Encounter **       Are you right handed or left handed? Right Handed    Are you currently employed ? 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 Yes   Who lives with you?    What type of home do you live in: 1 story or 2 story? Lives in a one story home        Social Drivers of Health   Financial Resource Strain: Medium Risk (07/18/2018)   Received from [facility] visits prior to 07/31/2022., [facility] visits prior to 07/31/2022.   Overall Financial Resource Strain (CARDIA)    Difficulty of Paying Living Expenses: Somewhat hard  Food Insecurity: No Food Insecurity (12/16/2022)   Hunger Vital Sign    Worried About Running Out of Food in the Last Year: Never true    Ran Out of Food in the Last Year: Never true  Transportation Needs: Unmet Transportation Needs (12/16/2022)   PRAPARE - Administrator, Civil Service (Medical): No    Lack of Transportation (Non-Medical): Yes  Physical Activity: Not on file  Stress: Not on file  Social Connections: Not on file    Labs: Hepatitis C Lab Results  Component Value Date   HCVGENOTYPE 1a 04/14/2023   HEPCAB REACTIVE (A) 04/06/2023   HCVRNAPCRQN <15 07/06/2023    HCVRNAPCRQN 233 (H) 06/02/2023   HCVRNAPCRQN 6,180,000 (H) 04/06/2023   FIBROSTAGE F0-F1 04/14/2023   Hepatitis B Lab Results  Component Value Date   HEPBSAB NON-REACTIVE 04/14/2023   HEPBSAG NON-REACTIVE 04/14/2023   HEPBCAB NON-REACTIVE 04/14/2023   Hepatitis A Lab Results  Component Value Date   HAV NON-REACTIVE 06/02/2023  HIV No results found for: "HIV" Lab Results  Component Value Date   CREATININE 1.59 (H) 07/24/2023   CREATININE 1.30 (H) 07/21/2023   CREATININE 1.33 07/19/2023   CREATININE 1.47 (H) 06/25/2023   CREATININE 1.59 (H) 06/11/2023   Lab Results  Component Value Date   AST 21 07/24/2023   AST 19 07/21/2023   AST 16 07/19/2023   ALT 14 07/24/2023   ALT 13 07/21/2023   ALT 10 07/19/2023   INR 1.1 04/27/2023   INR 1.1 04/14/2023    Assessment: Miko presents to clinic today for HCV follow-up as they have completed their full 12 weeks of Epclusa without any missed doses or side effects. His viral load at one month declined nicely from > 6 million to ~200, but it had not reached undetectable.Thankfully, his viral load eventually decreased to undetectable after a recheck ~8 weeks into treatment. He informed Cassie at that visit he had missed a couple doses here and there. Will check HCV RNA today and follow-up in 3 months with [facility] for SVR.   He discontinued simvastatin 10 mg once daily while taking Epclusa due to risk for myalgias and as patient was already experiencing mylagias before starting Epclusa. Family medicine provider Hyman Hopes, NP initiated fish oils for him in the mean time. Messaged Hyman Hopes, NP about resuming simvastatin. Takashi states he will complete his remaining ~4 days of fish oils and then resume the simvastatin tablets he has at home. A new prescription will need to be sent in for him because it was discontinued while he was taking Epclusa.   Due for 2/2 HAV in August. Defers Shingrix vaccine today. Will also recheck  hepatitis B immunity since he completed the repeat HBV vaccine series last month.    Plan: - Check HCV RNA and HBV sAb - Resume simvastatin 10 mg once daily (unless otherwise noted by Hyman Hopes, NP)  - Follow up with Judeth Cornfield on 11/23/23  Margarite Gouge, PharmD, CPP, BCIDP, AAHIVP Clinical Pharmacist Practitioner Infectious Diseases Clinical Pharmacist [facility]

## 2023-08-09 ENCOUNTER — Other Ambulatory Visit: Payer: Self-pay

## 2023-08-09 ENCOUNTER — Ambulatory Visit (INDEPENDENT_AMBULATORY_CARE_PROVIDER_SITE_OTHER): Payer: Medicaid Other | Admitting: Pharmacist

## 2023-08-09 DIAGNOSIS — Z23 Encounter for immunization: Secondary | ICD-10-CM

## 2023-08-09 DIAGNOSIS — B182 Chronic viral hepatitis C: Secondary | ICD-10-CM

## 2023-08-10 ENCOUNTER — Ambulatory Visit: Payer: Medicaid Other | Attending: Cardiology | Admitting: Cardiology

## 2023-08-10 ENCOUNTER — Encounter: Payer: Self-pay | Admitting: Cardiology

## 2023-08-10 ENCOUNTER — Other Ambulatory Visit (HOSPITAL_COMMUNITY): Payer: Self-pay

## 2023-08-10 VITALS — BP 120/88 | HR 93 | Resp 16 | Ht 70.0 in | Wt 232.2 lb

## 2023-08-10 DIAGNOSIS — R072 Precordial pain: Secondary | ICD-10-CM | POA: Diagnosis not present

## 2023-08-10 DIAGNOSIS — I700 Atherosclerosis of aorta: Secondary | ICD-10-CM | POA: Diagnosis not present

## 2023-08-10 DIAGNOSIS — F17200 Nicotine dependence, unspecified, uncomplicated: Secondary | ICD-10-CM

## 2023-08-10 DIAGNOSIS — I10 Essential (primary) hypertension: Secondary | ICD-10-CM

## 2023-08-10 DIAGNOSIS — E7800 Pure hypercholesterolemia, unspecified: Secondary | ICD-10-CM

## 2023-08-10 MED ORDER — ATORVASTATIN CALCIUM 20 MG PO TABS
20.0000 mg | ORAL_TABLET | Freq: Every day | ORAL | 0 refills | Status: DC
Start: 2023-08-10 — End: 2024-01-24
  Filled 2023-08-10: qty 90, 90d supply, fill #0

## 2023-08-10 MED ORDER — NICOTINE 21 MG/24HR TD PT24
21.0000 mg | MEDICATED_PATCH | Freq: Every day | TRANSDERMAL | 0 refills | Status: AC
  Filled 2023-08-10: qty 28, 28d supply, fill #0

## 2023-08-10 MED ORDER — NICOTINE 14 MG/24HR TD PT24
14.0000 mg | MEDICATED_PATCH | Freq: Every day | TRANSDERMAL | 0 refills | Status: DC
  Filled 2023-08-10: qty 28, 28d supply, fill #0

## 2023-08-10 MED ORDER — NICOTINE 7 MG/24HR TD PT24
7.0000 mg | MEDICATED_PATCH | Freq: Every day | TRANSDERMAL | 0 refills | Status: AC
  Filled 2023-08-10: qty 28, 28d supply, fill #0

## 2023-08-10 NOTE — Progress Notes (Signed)
 Cardiology Office Note:  .   Date:  08/10/2023  ID:  Shaun Morgan, DOB [birth year], MRN [id number] PCP: Clayborne Dana, NP  [facility] Providers Cardiologist:  Yates Decamp, MD   History of Present Illness: .   Shaun Morgan is a 63 y.o. with hypertension, mild hypercholesterolemia not on therapy, tobacco use disorder, referred to me for evaluation of chest pain as he presented to the emergency room.  Described chest pain as left upper side of the chest, described as sharp and lasted few seconds sometimes a few minutes, worse on taking deep breaths.  He has had a CT scan of the chest that was negative for PE.  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  He is presently undergoing chemotherapy for prostate cancer with antiestrogen therapy and has noticed fatigue, weight gain and flushing frequently.  Discussed the use of AI scribe software for clinical note transcription with the patient, who gave verbal consent to proceed.  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 He describes the chest pain as intermittent and located in the mid-sternal region, radiating upwards. The pain is exacerbated by deep breathing. He also reports experiencing shortness of breath and hot flashes, which he attributes to the Eligard. He has attempted to manage these symptoms by taking breaks during his work, which involves frequent walking and lifting of moderate weights.  In addition to these symptoms, the patient is concerned about his weight gain, which he describes as making him "obese". He is unsure of how to manage this weight gain. He also reports being a smoker and is currently trying to quit. He has tried various methods including cigars and vaping, but has not yet been successful.       Labs   Lab Results  Component Value Date   CHOL 223 (H) 07/19/2023   HDL 86.80 07/19/2023   LDLCALC 122 (H) 07/19/2023   TRIG 68.0 07/19/2023   CHOLHDL 3 07/19/2023   Lab Results  Component Value Date   NA 137 07/24/2023   K 3.9 07/24/2023   CO2 24 07/24/2023   GLUCOSE 95 07/24/2023   BUN 28 (H) 07/24/2023   CREATININE 1.59 (H) 07/24/2023   CALCIUM 9.5 07/24/2023   GFR 57.49 (L) 07/19/2023   GFRNONAA 49 (L) 07/24/2023      Latest Ref Rng & Units 07/24/2023   11:43 PM 07/21/2023   11:14 AM 07/19/2023    9:06 AM  BMP  Glucose 70 - 99 mg/dL 95  95  81   BUN 8 - 23 mg/dL 28  16  18    Creatinine 0.61 - 1.24 mg/dL 4.09  8.11  9.14   Sodium 135 - 145 mmol/L 137  141  140   Potassium 3.5 - 5.1 mmol/L 3.9  4.7  4.1   Chloride 98 - 111 mmol/L 104  105  103   CO2 22 - 32 mmol/L 24  26  29    Calcium 8.9 - 10.3 mg/dL 9.5  78.2  9.2       Latest Ref Rng & Units 07/24/2023   11:43 PM 07/21/2023   11:14 AM 07/19/2023    9:06 AM  CBC  WBC 4.0 - 10.5 K/uL 5.2  4.0  5.1   Hemoglobin  13.0 - 17.0 g/dL 81.1  91.4  78.2   Hematocrit 39.0 - 52.0 % 33.0  34.6  32.4   Platelets 150 - 400 K/uL 144  144  151.0    Lab Results  Component Value Date   HGBA1C 4.8 04/06/2023    Lab Results  Component Value Date   TSH 1.11 04/06/2023     Review of Systems  Constitutional: Positive for malaise/fatigue (chemotherapy induced).  Cardiovascular:  Positive for chest pain. Negative for dyspnea on exertion and leg swelling.   Physical Exam:   VS:  BP 120/88 (BP Location: Left Arm, Patient Position: Sitting, Cuff Size: Large)   Pulse 93   Resp 16   Ht 5\' 10"  (1.778 m)   Wt 232 lb 3.2 oz (105.3 kg)   SpO2 98%   BMI 33.32 kg/m    Wt Readings from Last 3 Encounters:  08/10/23 232 lb 3.2 oz (105.3 kg)  07/24/23 237 lb (107.5 kg)  07/21/23 237 lb (107.5 kg)     Physical Exam Constitutional:      Appearance: He is obese.  Neck:     Vascular: No carotid bruit or JVD.  Cardiovascular:      Rate and Rhythm: Normal rate and regular rhythm.     Pulses: Intact distal pulses.     Heart sounds: Normal heart sounds. No murmur heard.    No gallop.  Pulmonary:     Effort: Pulmonary effort is normal.     Breath sounds: Normal breath sounds.  Abdominal:     General: Bowel sounds are normal.     Palpations: Abdomen is soft.  Musculoskeletal:     Right lower leg: No edema.     Left lower leg: No edema.    Studies Reviewed: .    CT angiogram chest PE protocol 06/07/2023: Cardiovascular: Negative for acute pulmonary embolism. No pericardial effusion. No evidence of pulmonary embolism.  CT abdomen stone study 06/11/2023: Vascular/Lymphatic: Aortic atherosclerosis. No enlarged abdominal or pelvic lymph nodes.  EKG:    EKG Interpretation Date/Time:  Wednesday August 10 2023 15:39:52 EDT Ventricular Rate:  82 PR Interval:  170 QRS Duration:  80 QT Interval:  366 QTC Calculation: 427 R Axis:   24  Text Interpretation: EKG 08/10/2023: Normal sinus rhythm at rate of 82 bpm, normal EKG.  Compared to 06/11/2023,  lateral ST elevation, consistent with early repolarization less prominent. Confirmed by Delrae Rend [id number] on 08/10/2023 4:02:39 PM    Medications and allergies    Allergies  Allergen Reactions   Acetaminophen Other (See Comments)    Hep C     Current Outpatient Medications:    albuterol (PROVENTIL HFA;VENTOLIN HFA) 108 (90 BASE) MCG/ACT inhaler, Inhale 2 puffs into the lungs every 6 (six) hours as needed for wheezing or shortness of breath., Disp: , Rfl:    atorvastatin (LIPITOR) 20 MG tablet, Take 1 tablet (20 mg total) by mouth daily., Disp: 90 tablet, Rfl: 0   bisacodyl (DULCOLAX) 10 MG suppository, Place 1 suppository (10 mg total) rectally as needed for moderate constipation., Disp: 12 suppository, Rfl: 0   Calcium Carb-Cholecalciferol (CALCIUM + VITAMIN D3) 600-10 MG-MCG TABS, Take 1 tablet by mouth 2 (two) times daily with a meal., Disp: 60 tablet, Rfl:  2   fluticasone-salmeterol (WIXELA INHUB) 100-50 MCG/ACT AEPB, Inhale 1 puff into the lungs 2 (two) times daily., Disp: 1 each, Rfl: 3   gabapentin (NEURONTIN) 300 MG capsule, Take 1 capsule (300 mg total) by mouth 3 (three)  times daily., Disp: 90 capsule, Rfl: 3   hydrocortisone cream 1 %, Apply 1 Application topically 2 (two) times daily., Disp: 30 g, Rfl: 0   ketoconazole (NIZORAL) 2 % cream, Apply 1 Application topically daily., Disp: 60 g, Rfl: 0   lidocaine (XYLOCAINE) 5 % ointment, Apply 1 Application topically as needed., Disp: 35.44 g, Rfl: 0   lisinopril-hydrochlorothiazide (ZESTORETIC) 20-25 MG tablet, Take 1 tablet by mouth daily., Disp: , Rfl:    [START ON 10/07/2023] nicotine (NICODERM CQ - DOSED IN MG/24 HOURS) 14 mg/24hr patch, Place 1 patch (14 mg total) onto the skin daily., Disp: 28 patch, Rfl: 0   nicotine (NICODERM CQ - DOSED IN MG/24 HOURS) 21 mg/24hr patch, Place 1 patch (21 mg total) onto the skin daily for 28 days., Disp: 28 patch, Rfl: 0   [START ON 09/08/2023] nicotine (NICODERM CQ - DOSED IN MG/24 HR) 7 mg/24hr patch, Place 1 patch (7 mg total) onto the skin daily for 28 days., Disp: 28 patch, Rfl: 0   oxyCODONE (ROXICODONE) 5 MG immediate release tablet, Take 1 tablet (5 mg total) by mouth every 6 (six) hours as needed for severe pain (pain score 7-10)., Disp: 15 tablet, Rfl: 0   oxyCODONE-acetaminophen (PERCOCET/ROXICET) 5-325 MG tablet, Take 1 tablet by mouth every 6 (six) hours as needed for severe pain (pain score 7-10)., Disp: 10 tablet, Rfl: 0   polyethylene glycol (MIRALAX) 17 g packet, Take 17 g by mouth daily., Disp: 30 each, Rfl: 1   solifenacin (VESICARE) 5 MG tablet, Take 1 tablet (5 mg total) by mouth at bedtime., Disp: 30 tablet, Rfl: 11   tamsulosin (FLOMAX) 0.4 MG CAPS capsule, Take 2 capsules (0.8 mg total) by mouth daily after supper., Disp: 180 capsule, Rfl: 1   ASSESSMENT AND PLAN: .      ICD-10-CM   1. Precordial pain  R07.2 EXERCISE TOLERANCE TEST  (ETT)    Cardiac Stress Test: Informed Consent Details: Physician/Practitioner Attestation; Transcribe to consent form and obtain patient signature    2. Primary hypertension  I10 EKG 12-Lead    3. Tobacco use disorder  F17.200 nicotine (NICODERM CQ - DOSED IN MG/24 HOURS) 21 mg/24hr patch    nicotine (NICODERM CQ - DOSED IN MG/24 HOURS) 14 mg/24hr patch    nicotine (NICODERM CQ - DOSED IN MG/24 HR) 7 mg/24hr patch    Cardiac Stress Test: Informed Consent Details: Physician/Practitioner Attestation; Transcribe to consent form and obtain patient signature    4. Aortic atherosclerosis (HCC)  I70.0 atorvastatin (LIPITOR) 20 MG tablet    Cardiac Stress Test: Informed Consent Details: Physician/Practitioner Attestation; Transcribe to consent form and obtain patient signature    5. Hypercholesteremia  E78.00 atorvastatin (LIPITOR) 20 MG tablet     Assessment and Plan    Chest Pain   Intermittent chest pain is likely musculoskeletal, worsened by deep breathing and certain movements, suggesting a non-cardiac origin. Differential diagnosis includes musculoskeletal pain versus cardiac etiology. Due to his occupation as a Naval architect, further evaluation is necessary. Schedule a treadmill exercise stress test to evaluate cardiac function.  Hyperlipidemia   Management is required to reduce cardiovascular risk. Initiate cholesterol-lowering medication, 10 mg once daily, and emphasize adherence to manage cholesterol levels. Follow up with the primary care physician in two months to check cholesterol levels.  Prostate Cancer   He is receiving Eligard injections and experiencing side effects such as hot flashes and fatigue. Smoking cessation is critical due to the cancer diagnosis and potential cardiovascular risks.  Tobacco Use Disorder   He currently smokes cigarettes, cigars, and vapes. Smoking cessation is crucial due to the cancer diagnosis and potential cardiovascular risks. Discussed the  importance of quitting to prevent heart attack and other health issues. Prescribe nicotine patches, starting with a high dose and tapering over three months. Refer to smoking cessation resources, including the Cancer Society's smoking coach and 1-800-QUIT line. I spent additional 7 minutes in discussions regarding smoking cessation and the importance of smoking cessation and the benefits of smoking cessation including reduction in cancer risk, infection and vascular events.  Patient appears to be motivated.  Obesity   He has significant weight gain and is currently obese. Weight gain may be related to lifestyle factors and possibly exacerbated by prostate cancer treatment. Emphasize healthy lifestyle changes to manage weight. Advise on healthy eating habits, including increased intake of vegetables and fruits, and avoidance of high-calorie foods. Encourage regular physical activity, including walking and other exercises.          Signed,  Yates Decamp, MD, [facility] 08/10/2023, 8:18 PM [facility] [address] [place], [place] [address] Phone: [phone]. Fax:  [phone]

## 2023-08-10 NOTE — Patient Instructions (Signed)
 Medication Instructions:  Your physician has recommended you make the following change in your medication:  1) START atorvastatin 20 mg daily 2) START nicotine patch (21mg  patch first for one month, then 14mg  patch for one month, then 7mg  patch for one month)  *If you need a refill on your cardiac medications before your next appointment, please call your pharmacy*  Lab Work: Please have your primary care provider get labs in May 2025 to check your cholesterol (Lipid panel).  Testing/Procedures: Your physician has requested that you have an exercise tolerance test. For further information please visit [email or web address] Please also follow instruction sheet, as given.   Follow-Up: At [facility], you and your health needs are our priority.  As part of our continuing mission to provide you with exceptional heart care, we have created designated Provider Care Teams.  These Care Teams include your primary Cardiologist (physician) and Advanced Practice Providers (APPs -  Physician Assistants and Nurse Practitioners) who all work together to provide you with the care you need, when you need it.  Your next appointment:   As needed  The format for your next appointment:   In Person  Provider:   Yates Decamp, MD {  Other Instructions   1st Floor: - Lobby - Registration  - Pharmacy  - Lab - Cafe  2nd Floor: - PV Lab - Diagnostic Testing (echo, CT, nuclear med)  3rd Floor: - Vacant  4th Floor: - TCTS (cardiothoracic surgery) - AFib Clinic - Structural Heart Clinic - Vascular Surgery  - Vascular Ultrasound  5th Floor: - [facility]Care Cardiology (general and EP) - Clinical Pharmacy for coumadin, hypertension, lipid, weight-loss medications, and med management appointments    Valet parking services will be available as well.

## 2023-08-11 ENCOUNTER — Ambulatory Visit (INDEPENDENT_AMBULATORY_CARE_PROVIDER_SITE_OTHER): Payer: Medicaid Other | Admitting: Urology

## 2023-08-11 ENCOUNTER — Encounter: Payer: Self-pay | Admitting: Urology

## 2023-08-11 VITALS — BP 133/82 | HR 76 | Ht 69.0 in | Wt 232.0 lb

## 2023-08-11 DIAGNOSIS — N138 Other obstructive and reflux uropathy: Secondary | ICD-10-CM | POA: Diagnosis not present

## 2023-08-11 DIAGNOSIS — N401 Enlarged prostate with lower urinary tract symptoms: Secondary | ICD-10-CM | POA: Diagnosis not present

## 2023-08-11 DIAGNOSIS — R351 Nocturia: Secondary | ICD-10-CM

## 2023-08-11 DIAGNOSIS — C61 Malignant neoplasm of prostate: Secondary | ICD-10-CM | POA: Diagnosis not present

## 2023-08-11 DIAGNOSIS — Z79818 Long term (current) use of other agents affecting estrogen receptors and estrogen levels: Secondary | ICD-10-CM | POA: Diagnosis not present

## 2023-08-11 DIAGNOSIS — IMO0001 Reserved for inherently not codable concepts without codable children: Secondary | ICD-10-CM

## 2023-08-11 LAB — MICROSCOPIC EXAMINATION: Bacteria, UA: NONE SEEN

## 2023-08-11 LAB — URINALYSIS, ROUTINE W REFLEX MICROSCOPIC
Bilirubin, UA: NEGATIVE
Glucose, UA: NEGATIVE
Ketones, UA: NEGATIVE
Leukocytes,UA: NEGATIVE
Nitrite, UA: NEGATIVE
Protein,UA: NEGATIVE
Specific Gravity, UA: 1.02 (ref 1.005–1.030)
Urobilinogen, Ur: 0.2 mg/dL (ref 0.2–1.0)
pH, UA: 5.5 (ref 5.0–7.5)

## 2023-08-11 LAB — HEPATITIS C RNA QUANTITATIVE
HCV Quantitative Log: <1.18 {Log_IU}/mL
HCV RNA, PCR, QN: <15 [IU]/mL

## 2023-08-11 LAB — HEPATITIS B SURFACE ANTIBODY,QUALITATIVE: Hep B S Ab: REACTIVE — AB

## 2023-08-11 MED ORDER — LEUPROLIDE ACETATE (6 MONTH) 45 MG ~~LOC~~ KIT
45.0000 mg | PACK | Freq: Once | SUBCUTANEOUS | Status: AC
  Administered 2023-08-11: 45 mg via SUBCUTANEOUS

## 2023-08-11 NOTE — Progress Notes (Signed)
 Eligard SubQ Injection   Due to Prostate Cancer patient is present today for a Eligard Injection.  Medication: Eligard 6 month Dose: 45 mg  Location: right upper outer buttocks  Lot: [id number] Exp: 10/2024  Patient tolerated well, no complications were noted  Performed by: Arville Go CMA   PA approval dates:  No PA required, medicaid

## 2023-08-11 NOTE — Progress Notes (Signed)
 Assessment: 1. Malignant neoplasm of prostate (HCC); high risk; s/p XRT, on ADT   2. Androgen deprivation therapy   3. BPH with obstruction/lower urinary tract symptoms   4. Nocturia     Plan: PSA today Continue tamsulosin 0.8 mg nightly Resume solifenacin 5 mg nightly.  I discussed the importance of ADT in management of high risk prostate cancer.  I also discussed the typical side effects including hot flashes. Recommend continuing ADT for total of 2 years due to high risk prostate cancer - next dose due after 02/07/24 Continue daily calcium and vitamin D supplementation. Recommend bone density study to evaluate for drug induced osteoporosis. Return to office in 3 months  Chief Complaint:  Chief Complaint  Patient presents with   Prostate Cancer    History of Present Illness:  Shaun Morgan is a 63 y.o. male who is seen for continued evaluation of high risk prostate cancer. He was found to have an elevated PSA of 27.7 and a abnormal prostate exam with firmness on the left.  He underwent a prostate ultrasound and biopsy on 04/02/2022 which demonstrated Gleason 4+4 = 8 adenocarcinoma in 10/12 cores.  Prostate volume measured 48.5 g.  PSMA PET scan from 05/20/2022 was negative for metastatic disease with uptake at the base of the prostate and probable bilateral seminal vesicle involvement. He elected to proceed with ADT and IMRT.  He received a 2-month Eligard on 07/21/2022. He underwent placement of fiducial markers and SpaceOAR on 09/07/2022.  He developed urinary retention postoperatively requiring Foley catheter placement in the emergency department. His Foley was removed on 10/06/2022. He completed his radiation treatments on 12/24/2022. He was given a 6 month Eligard on 02/09/23.  PSA data: 9/24 <0.1 12/24   <0.1  At his visit in 9/24, he continued with lower urinary tract symptoms including hesitancy, primarily at night, sensation of incomplete emptying, decreased stream,  and nocturia every hour.  His dysuria was improving.  No gross hematuria.  He continued on tamsulosin 0.8 mg nightly. IPSS = 22 QOL = 6/6. At his visit in 12/24, he continued with lower urinary tract symptoms, primarily nocturia x 5.  He reported voiding with a good stream.  No daytime symptoms.  No dysuria or gross hematuria. IPSS = 17. PVR = 25 ml. He was tolerating androgen deprivation therapy well.  No significant hot flashes.  He reported a good appetite.  No bone pain or weight loss. He was given a trial of Solifenacin 5 mg daily at his visit in 12/24.  He returns today for follow-up and for his 21-month Eligard injection.  He continues on tamsulosin 0.8 mg daily.  He does not think that he is currently taking the Solifenacin.  He did note improvement when he started taking the Solifenacin with decrease in his frequency, urgency, and nocturia.  No current dysuria or gross hematuria.  He is having more hot flashes.  He reports a good appetite.  He has had some weight gain.  No bone pain.  Portions of the above documentation were copied from a prior visit for review purposes only.  Past Medical History:  Past Medical History:  Diagnosis Date   Asthma    Hepatitis C    per detention nurse   Hypertension    Neuropathy    Syphilis    per detention nurse   Wears partial dentures    upper/lower    Past Surgical History:  Past Surgical History:  Procedure Laterality Date   CARPAL  TUNNEL RELEASE     GOLD SEED IMPLANT N/A 09/07/2022   Procedure: GOLD SEED IMPLANT;  Surgeon: Despina Arias, MD;  Location: [facility];  Service: Urology;  Laterality: N/A;  30   SPACE OAR INSTILLATION N/A 09/07/2022   Procedure: SPACE OAR INSTILLATION;  Surgeon: Despina Arias, MD;  Location: [facility];  Service: Urology;  Laterality: N/A;    Allergies:  Allergies  Allergen Reactions   Acetaminophen Other (See Comments)    Hep C    Family History:  Family History   Problem Relation Age of Onset   Epilepsy Mother    Heart disease Father     Social History:  Social History   Tobacco Use   Smoking status: Former    Current packs/day: 0.00    Types: Cigarettes, E-cigarettes, Pipe    Quit date: 10/2021    Years since quitting: 1.7   Tobacco comments:    Patient incarcerated 10/2021, no nicotine products allowed since incarceration per detention center nurse        05/02/23- patient vapes non nocotine   Vaping Use   Vaping status: Never Used  Substance Use Topics   Alcohol use: Yes    Alcohol/week: 6.0 standard drinks of alcohol    Types: 6 Standard drinks or equivalent per week   Drug use: Never    ROS: Constitutional:  Negative for fever, chills, weight loss CV: Negative for chest pain, previous MI, hypertension Respiratory:  Negative for shortness of breath, wheezing, sleep apnea, frequent cough GI:  Negative for nausea, vomiting, bloody stool, GERD  Physical exam: BP 133/82   Pulse 76   Ht 5\' 9"  (1.753 m)   Wt 232 lb (105.2 kg)   BMI 34.26 kg/m  GENERAL APPEARANCE:  Well appearing, well developed, well nourished, NAD HEENT:  Atraumatic, normocephalic, oropharynx clear NECK:  Supple without lymphadenopathy or thyromegaly ABDOMEN:  Soft, non-tender, no masses EXTREMITIES:  Moves all extremities well, without clubbing, cyanosis, or edema NEUROLOGIC:  Alert and oriented x 3, normal gait, CN II-XII grossly intact MENTAL STATUS:  appropriate BACK:  Non-tender to palpation, No CVAT SKIN:  Warm, dry, and intact  Results: U/A:  0-5 WBC, 0-2 RBC

## 2023-08-12 ENCOUNTER — Encounter: Payer: Self-pay | Admitting: Urology

## 2023-08-12 ENCOUNTER — Other Ambulatory Visit: Payer: Self-pay | Admitting: Pharmacist

## 2023-08-12 LAB — PSA: Prostate Specific Ag, Serum: <0.1 ng/mL (ref 0.0–4.0)

## 2023-08-12 NOTE — Progress Notes (Signed)
 Specialty Pharmacy Ongoing Clinical Assessment Note  Shaun Morgan is a 63 y.o. male who is being followed by the specialty pharmacy service for RxSp Hepatitis C   Patient's specialty medication(s) reviewed today: Sofosbuvir-Velpatasvir   Missed doses in the last 4 weeks: 0   Patient/Caregiver did not have any additional questions or concerns.   Therapeutic benefit summary: Patient is achieving benefit   Adverse events/side effects summary: No adverse events/side effects   Patient's therapy is appropriate to: Discontinue    Goals Addressed   None     Follow up: none required - completed therapy - disenrolling  Jennette Kettle Specialty Pharmacist

## 2023-08-18 ENCOUNTER — Ambulatory Visit (HOSPITAL_BASED_OUTPATIENT_CLINIC_OR_DEPARTMENT_OTHER)
Admission: RE | Admit: 2023-08-18 | Discharge: 2023-08-18 | Disposition: A | Discharge status: Home / Self Care | Source: Ambulatory Visit | Attending: Urology | Admitting: Urology

## 2023-08-18 DIAGNOSIS — C61 Malignant neoplasm of prostate: Secondary | ICD-10-CM | POA: Diagnosis not present

## 2023-08-18 DIAGNOSIS — Z13820 Encounter for screening for osteoporosis: Secondary | ICD-10-CM | POA: Diagnosis present

## 2023-08-18 DIAGNOSIS — Z79818 Long term (current) use of other agents affecting estrogen receptors and estrogen levels: Secondary | ICD-10-CM | POA: Diagnosis present

## 2023-08-18 DIAGNOSIS — M85851 Other specified disorders of bone density and structure, right thigh: Secondary | ICD-10-CM | POA: Diagnosis not present

## 2023-08-24 ENCOUNTER — Encounter: Payer: Self-pay | Admitting: Urology

## 2023-08-25 ENCOUNTER — Other Ambulatory Visit (HOSPITAL_COMMUNITY): Payer: Self-pay | Admitting: Emergency Medicine

## 2023-08-25 ENCOUNTER — Other Ambulatory Visit (HOSPITAL_BASED_OUTPATIENT_CLINIC_OR_DEPARTMENT_OTHER): Payer: Self-pay

## 2023-08-25 ENCOUNTER — Other Ambulatory Visit: Payer: Self-pay | Admitting: Urology

## 2023-08-25 ENCOUNTER — Other Ambulatory Visit: Payer: Self-pay | Admitting: Family Medicine

## 2023-08-25 ENCOUNTER — Other Ambulatory Visit: Payer: Self-pay

## 2023-08-25 ENCOUNTER — Other Ambulatory Visit (HOSPITAL_COMMUNITY): Payer: Self-pay

## 2023-08-25 MED ORDER — ALENDRONATE SODIUM 70 MG PO TABS: 70.0000 mg | ORAL_TABLET | ORAL | 3 refills | Status: DC

## 2023-08-25 MED ORDER — ALBUTEROL SULFATE HFA 108 (90 BASE) MCG/ACT IN AERS
2.0000 | INHALATION_SPRAY | Freq: Four times a day (QID) | RESPIRATORY_TRACT | 2 refills | Status: AC | PRN
  Filled 2023-08-25: qty 18, 30d supply, fill #0
  Filled 2023-11-29: qty 18, 30d supply, fill #1

## 2023-08-26 ENCOUNTER — Other Ambulatory Visit: Payer: Self-pay

## 2023-08-29 ENCOUNTER — Other Ambulatory Visit: Payer: Self-pay

## 2023-08-29 ENCOUNTER — Other Ambulatory Visit (HOSPITAL_COMMUNITY): Payer: Self-pay

## 2023-08-30 ENCOUNTER — Other Ambulatory Visit: Payer: Self-pay

## 2023-08-30 ENCOUNTER — Encounter (HOSPITAL_COMMUNITY): Payer: Self-pay

## 2023-08-30 ENCOUNTER — Emergency Department (HOSPITAL_COMMUNITY)
Admission: EM | Admit: 2023-08-30 | Discharge: 2023-08-31 | Discharge status: Against Medical Advice | Attending: Emergency Medicine | Admitting: Emergency Medicine

## 2023-08-30 DIAGNOSIS — M25512 Pain in left shoulder: Secondary | ICD-10-CM | POA: Insufficient documentation

## 2023-08-30 DIAGNOSIS — R253 Fasciculation: Secondary | ICD-10-CM | POA: Insufficient documentation

## 2023-08-30 DIAGNOSIS — Z5321 Procedure and treatment not carried out due to patient leaving prior to being seen by health care provider: Secondary | ICD-10-CM | POA: Insufficient documentation

## 2023-08-30 NOTE — ED Triage Notes (Signed)
 Pt c/o left shoulder and arm twitching and cramping that started yesterday. Denies strenuous activities.

## 2023-08-30 NOTE — ED Notes (Signed)
 Pt called for a room x2 with no answer. Pt not seen in the ED.

## 2023-09-01 ENCOUNTER — Other Ambulatory Visit (HOSPITAL_COMMUNITY): Payer: Self-pay

## 2023-09-08 ENCOUNTER — Ambulatory Visit: Attending: Cardiology

## 2023-09-08 DIAGNOSIS — R072 Precordial pain: Secondary | ICD-10-CM

## 2023-09-09 ENCOUNTER — Encounter: Payer: Self-pay | Admitting: Cardiology

## 2023-09-09 DIAGNOSIS — I700 Atherosclerosis of aorta: Secondary | ICD-10-CM

## 2023-09-09 DIAGNOSIS — R072 Precordial pain: Secondary | ICD-10-CM

## 2023-09-09 DIAGNOSIS — R9439 Abnormal result of other cardiovascular function study: Secondary | ICD-10-CM

## 2023-09-09 DIAGNOSIS — R0609 Other forms of dyspnea: Secondary | ICD-10-CM

## 2023-09-09 LAB — EXERCISE TOLERANCE TEST
Angina Index: 0
Duke Treadmill Score: 3
Estimated workload: 4.6
Exercise duration (min): 3 min
Exercise duration (sec): 4 s
MPHR: 158 {beats}/min
Peak HR: 126 {beats}/min
Percent HR: 79 %
RPE: 19
Rest HR: 81 {beats}/min
ST Depression (mm): 0 mm

## 2023-09-09 NOTE — Progress Notes (Signed)
 Inconclusive stress EKG. 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   No ST deviation was noted at 79% of maximum predicted heart rate.   Moderately impaired exercise tolerance.  A Bruce protocol stress test was performed. Exercise capacity was moderately impaired. Patient exercised for 3 min and 4 sec. Maximum HR of 126 bpm. MPHR 79.0%. Peak METS 4.6.  The patient experienced no angina during the test. The test was stopped because the patient experienced fatigue and dyspnea. The patient requested the test to be stopped. The patient reported dyspnea, fatigue and muscle fatigue during the stress test. Normal blood pressure and normal heart rate response noted during stress. Heart rate recovery was normal.

## 2023-09-12 NOTE — Telephone Encounter (Signed)
 I spoke with patient and explained lexiscan and echocardiogram.  Patient willing to proceed with echo.  Order placed. Patient wanted me to check with Dr Berry Bristol before agreeing to proceed with Lexican.  Patient reports he receives Eliguard injections every 6 months.  Last was in March.  These injections cause him to become short of breath, sweat and have hot flashes.  He also experienced this when on the treadmill during recent test causing his muscles to quit and he was unable to finish the test.  Patient concerned combination of Lexiscan and Eliguard would cause him to have speeding heart rate, hot flashes, sweating and shortness of breath.  He wanted to make Dr Berry Bristol aware of this and see if Dr Berry Bristol feels it is OK for him to have [employer]

## 2023-09-13 NOTE — Telephone Encounter (Signed)
 Reviewed with Dr Berry Bristol and should be OK for patient to have Lexiscan myoview.  Patient made aware and he would like to proceed with Lexiscan

## 2023-09-21 ENCOUNTER — Encounter (HOSPITAL_COMMUNITY)

## 2023-09-27 ENCOUNTER — Other Ambulatory Visit: Payer: Self-pay | Admitting: Cardiology

## 2023-09-27 ENCOUNTER — Encounter (HOSPITAL_COMMUNITY): Payer: Self-pay | Admitting: *Deleted

## 2023-09-27 ENCOUNTER — Ambulatory Visit (HOSPITAL_COMMUNITY)

## 2023-09-27 DIAGNOSIS — R072 Precordial pain: Secondary | ICD-10-CM

## 2023-09-27 DIAGNOSIS — I700 Atherosclerosis of aorta: Secondary | ICD-10-CM

## 2023-10-04 ENCOUNTER — Ambulatory Visit (HOSPITAL_COMMUNITY): Attending: Cardiology

## 2023-10-04 DIAGNOSIS — R072 Precordial pain: Secondary | ICD-10-CM | POA: Diagnosis not present

## 2023-10-04 DIAGNOSIS — I700 Atherosclerosis of aorta: Secondary | ICD-10-CM | POA: Diagnosis not present

## 2023-10-04 DIAGNOSIS — E782 Mixed hyperlipidemia: Secondary | ICD-10-CM | POA: Diagnosis present

## 2023-10-04 MED ORDER — TECHNETIUM TC 99M TETROFOSMIN IV KIT
28.7000 | PACK | Freq: Once | INTRAVENOUS | Status: AC | PRN
  Administered 2023-10-04: 28.7 via INTRAVENOUS

## 2023-10-04 MED ORDER — REGADENOSON 0.4 MG/5ML IV SOLN
0.4000 mg | Freq: Once | INTRAVENOUS | Status: AC
  Administered 2023-10-04: 0.4 mg via INTRAVENOUS

## 2023-10-04 MED ORDER — REGADENOSON 0.4 MG/5ML IV SOLN
INTRAVENOUS | Status: AC
  Filled 2023-10-04: qty 5

## 2023-10-04 MED ORDER — TECHNETIUM TC 99M TETROFOSMIN IV KIT
10.6000 | PACK | Freq: Once | INTRAVENOUS | Status: AC | PRN
Start: 2023-10-04 — End: 2023-10-04
  Administered 2023-10-04: 10.6 via INTRAVENOUS

## 2023-10-05 ENCOUNTER — Encounter: Payer: Self-pay | Admitting: Cardiology

## 2023-10-05 ENCOUNTER — Encounter (HOSPITAL_COMMUNITY)

## 2023-10-05 LAB — MYOCARDIAL PERFUSION IMAGING
Base ST Depression (mm): 0 mm
LV dias vol: 110 mL (ref 62–150)
LV sys vol: 31 mL
Nuc Stress EF: 72 %
Peak HR: 105 {beats}/min
Rest HR: 67 {beats}/min
Rest Nuclear Isotope Dose: 10.6 mCi
SDS: 0
SRS: 0
SSS: 0
ST Depression (mm): 0 mm
Stress Nuclear Isotope Dose: 28.7 mCi
TID: 0.97

## 2023-10-05 NOTE — Progress Notes (Signed)
 Normal Lexiscan nuclear stress test, normal heart function as well.

## 2023-10-10 DIAGNOSIS — Z0120 Encounter for dental examination and cleaning without abnormal findings: Secondary | ICD-10-CM | POA: Diagnosis not present

## 2023-10-17 ENCOUNTER — Ambulatory Visit (HOSPITAL_COMMUNITY)
Admission: RE | Admit: 2023-10-17 | Discharge: 2023-10-17 | Disposition: A | Discharge status: Home / Self Care | Source: Ambulatory Visit | Attending: Cardiology | Admitting: Cardiology

## 2023-10-17 DIAGNOSIS — I700 Atherosclerosis of aorta: Secondary | ICD-10-CM

## 2023-10-17 DIAGNOSIS — R072 Precordial pain: Secondary | ICD-10-CM | POA: Diagnosis not present

## 2023-10-17 LAB — ECHOCARDIOGRAM COMPLETE
Area-P 1/2: 3.66 cm2
S' Lateral: 2.3 cm

## 2023-10-18 ENCOUNTER — Ambulatory Visit: Payer: Self-pay | Admitting: Cardiology

## 2023-10-18 NOTE — Progress Notes (Signed)
 Normal echocardiogram. Mild aortic root dilatation is normal for your age and height

## 2023-11-02 ENCOUNTER — Other Ambulatory Visit: Payer: Self-pay | Admitting: Emergency Medicine

## 2023-11-02 DIAGNOSIS — I700 Atherosclerosis of aorta: Secondary | ICD-10-CM

## 2023-11-02 DIAGNOSIS — E7800 Pure hypercholesterolemia, unspecified: Secondary | ICD-10-CM

## 2023-11-08 ENCOUNTER — Other Ambulatory Visit (HOSPITAL_COMMUNITY): Payer: Self-pay

## 2023-11-08 ENCOUNTER — Emergency Department (HOSPITAL_BASED_OUTPATIENT_CLINIC_OR_DEPARTMENT_OTHER)

## 2023-11-08 ENCOUNTER — Encounter (HOSPITAL_BASED_OUTPATIENT_CLINIC_OR_DEPARTMENT_OTHER): Payer: Self-pay | Admitting: Emergency Medicine

## 2023-11-08 ENCOUNTER — Other Ambulatory Visit: Payer: Self-pay

## 2023-11-08 ENCOUNTER — Emergency Department (HOSPITAL_BASED_OUTPATIENT_CLINIC_OR_DEPARTMENT_OTHER)
Admission: EM | Admit: 2023-11-08 | Discharge: 2023-11-08 | Disposition: A | Discharge status: Home / Self Care | Attending: Emergency Medicine | Admitting: Emergency Medicine

## 2023-11-08 DIAGNOSIS — J45909 Unspecified asthma, uncomplicated: Secondary | ICD-10-CM | POA: Diagnosis not present

## 2023-11-08 DIAGNOSIS — S93401A Sprain of unspecified ligament of right ankle, initial encounter: Secondary | ICD-10-CM | POA: Diagnosis not present

## 2023-11-08 DIAGNOSIS — Z8546 Personal history of malignant neoplasm of prostate: Secondary | ICD-10-CM | POA: Diagnosis not present

## 2023-11-08 DIAGNOSIS — Z7951 Long term (current) use of inhaled steroids: Secondary | ICD-10-CM | POA: Diagnosis not present

## 2023-11-08 DIAGNOSIS — S99911A Unspecified injury of right ankle, initial encounter: Secondary | ICD-10-CM | POA: Diagnosis not present

## 2023-11-08 DIAGNOSIS — Z87891 Personal history of nicotine dependence: Secondary | ICD-10-CM | POA: Diagnosis not present

## 2023-11-08 DIAGNOSIS — M25571 Pain in right ankle and joints of right foot: Secondary | ICD-10-CM | POA: Diagnosis present

## 2023-11-08 DIAGNOSIS — I10 Essential (primary) hypertension: Secondary | ICD-10-CM | POA: Diagnosis not present

## 2023-11-08 DIAGNOSIS — X501XXA Overexertion from prolonged static or awkward postures, initial encounter: Secondary | ICD-10-CM | POA: Diagnosis not present

## 2023-11-08 DIAGNOSIS — Z79899 Other long term (current) drug therapy: Secondary | ICD-10-CM | POA: Diagnosis not present

## 2023-11-08 NOTE — ED Triage Notes (Signed)
 Right ankle injury, occurred this morning. Swelling present. Ambulatory with limp. States he stepped off a curb wrong and twisted it.

## 2023-11-08 NOTE — ED Provider Notes (Signed)
  Physical Exam  BP 129/81 (BP Location: Left Arm)   Pulse 97   Temp 98.2 F (36.8 C) (Oral)   Resp 18   Ht 5\' 9"  (1.753 m)   Wt 105 kg   SpO2 97%   BMI 34.18 kg/m   Physical Exam Vitals and nursing note reviewed.  HENT:     Head: Normocephalic and atraumatic.  Eyes:     Pupils: Pupils are equal, round, and reactive to light.  Cardiovascular:     Rate and Rhythm: Normal rate and regular rhythm.  Pulmonary:     Effort: Pulmonary effort is normal.     Breath sounds: Normal breath sounds.  Abdominal:     Palpations: Abdomen is soft.     Tenderness: There is no abdominal tenderness.  Skin:    General: Skin is warm and dry.  Neurological:     Mental Status: He is alert.  Psychiatric:        Mood and Affect: Mood normal.     Procedures  Procedures  ED Course / MDM   Clinical Course as of 11/08/23 1518  Tue Nov 08, 2023  1513 XR read pending at the time of sign out to oncoming provider. [KH]  1516 No fracture or dislocation of right ankle.  Suspect this is most likely due to ankle sprain.  Will discharge with Aircast and crutches and instructed for PCP follow-up and symptomatic management at home with rest ice compression elevation and NSAIDs [MP]    Clinical Course User Index [KH] Jonne Netters, MD [MP] Sallyanne Creamer, DO   Medical Decision Making I, Rafael Bun DO, have assumed care of this patient from the previous provider pending radiology read of ankle x-ray, reevaluation and disposition  Amount and/or Complexity of Data Reviewed Radiology: ordered.          Sallyanne Creamer, DO 11/08/23 [phone]

## 2023-11-08 NOTE — ED Provider Notes (Signed)
 [place] [facility] Provider Note  CSN: [id number] Arrival date & time: 11/08/23 1340  Chief Complaint(s) Ankle Pain  HPI Shaun Morgan is a 63 y.o. male with PMHx asthma, HTN, HLD, prostate cancer, chronic hep C presents with right ankle pain.  Reports he stepped off a curb this morning and twisted it. Able to walk with a limp. Noticed swelling immediately. Reports he was told not to take Tylenol  due to h/o hep C so he usually takes Oxycodone  for pain. No prior injury or surgery to the area. States he does have history of osteoporosis and he is taking bisphosphonate and worried he broke a bone.  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 Authorizing Provider  albuterol  (VENTOLIN  HFA) 108 (90 Base) MCG/ACT inhaler Inhale 2 puffs into the lungs every 6 (six) hours as needed for wheezing or shortness of breath. 08/25/23   Everlina Hock, NP  alendronate  (FOSAMAX ) 70 MG tablet Take 1 tablet (70 mg total) by mouth once a week. Take with a full glass of water on an empty stomach. 08/25/23   Stoneking, Ponce Brisker., MD  atorvastatin  (LIPITOR) 20 MG tablet Take 1 tablet (20 mg total) by mouth daily. 08/10/23 11/08/23  Knox Perl, MD  bisacodyl  (DULCOLAX) 10 MG  suppository Place 1 suppository (10 mg total) rectally as needed for moderate constipation. 06/29/23   Edson Graces, MD  Calcium  Carb-Cholecalciferol  (CALCIUM  + VITAMIN D3) 600-10 MG-MCG TABS Take 1 tablet by mouth 2 (two) times daily with a meal. 12/27/22   Bruning, Ashlyn, PA-C  fluticasone -salmeterol (WIXELA INHUB) 100-50 MCG/ACT AEPB Inhale 1 puff into the lungs 2 (two) times daily. 04/06/23   Everlina Hock, NP  gabapentin  (NEURONTIN ) 300 MG capsule Take 1 capsule (300 mg total) by mouth 3 (three) times daily. 06/06/23   Everlina Hock, NP  hydrocortisone  cream 1 % Apply 1 Application topically 2 (two) times daily. 12/27/22   Bruning, Ashlyn, PA-C  ketoconazole  (NIZORAL ) 2 % cream Apply 1 Application topically daily. 06/03/23   Charity Conch, DPM  lidocaine  (XYLOCAINE ) 5 % ointment Apply 1 Application topically as needed. 01/07/23   Trish Furl, MD  lisinopril-hydrochlorothiazide  (ZESTORETIC) 20-25 MG tablet Take 1 tablet by mouth daily.    [provider]  nicotine  (NICODERM CQ  - DOSED IN MG/24 HOURS) 14 mg/24hr patch Place 1 patch (14 mg total) onto the skin daily. 10/07/23   Knox Perl, MD  oxyCODONE  (ROXICODONE ) 5 MG immediate release tablet Take 1 tablet (5 mg total) by mouth every 6 (six) hours as needed for severe pain (pain score 7-10). 07/14/23   Almond Army, MD  oxyCODONE -acetaminophen  (PERCOCET/ROXICET) 5-325 MG tablet Take 1 tablet by mouth every 6 (  six) hours as needed for severe pain (pain score 7-10). 06/25/23   Haviland, Julie, MD  polyethylene glycol (MIRALAX ) 17 g packet Take 17 g by mouth daily. 06/29/23   Edson Graces, MD  solifenacin  (VESICARE ) 5 MG tablet Take 1 tablet (5 mg total) by mouth at bedtime. 05/12/23   Stoneking, Ponce Brisker., MD  tamsulosin  (FLOMAX ) 0.4 MG CAPS capsule Take 2 capsules (0.8 mg total) by mouth daily after supper. 04/12/23   Everlina Hock, NP                                                                                                                                     Past Surgical History Past Surgical History:  Procedure Laterality Date   CARPAL TUNNEL RELEASE     GOLD SEED IMPLANT N/A 09/07/2022   Procedure: GOLD SEED IMPLANT;  Surgeon: Mallie Seal, MD;  Location: [facility];  Service: Urology;  Laterality: N/A;  30   SPACE OAR INSTILLATION N/A 09/07/2022   Procedure: SPACE OAR INSTILLATION;  Surgeon: Mallie Seal, MD;  Location: [facility];  Service: Urology;  Laterality: N/A;   Family History Family History  Problem Relation Age of Onset   Epilepsy Mother    Heart disease Father     Social History Social History   Tobacco Use   Smoking status: Former    Current packs/day: 0.00    Types: Cigarettes, E-cigarettes, Pipe    Quit date: 10/2021    Years since quitting: 2.0   Tobacco comments:    Patient incarcerated 10/2021, no nicotine  products allowed since incarceration per detention center nurse        05/02/23- patient vapes non nocotine   Vaping Use   Vaping status: Never Used  Substance Use Topics   Alcohol use: Yes    Alcohol/week: 6.0 standard drinks of alcohol    Types: 6 Standard drinks or equivalent per week   Drug use: Never   Allergies Acetaminophen   Review of Systems A thorough review of systems was obtained and all systems are negative except as noted in the HPI and PMH.   Physical Exam Vital Signs  I have reviewed the triage vital signs BP 129/81 (BP Location: Left Arm)   Pulse 97   Temp 98.2 F (36.8 C) (Oral)   Resp 18   Ht 5\' 9"  (1.753 m)   Wt 105 kg   SpO2 97%   BMI 34.18 kg/m  Physical Exam  ED Results and Treatments Labs (all labs ordered are listed, but only abnormal results are displayed) Labs Reviewed - No data to display  Radiology No results found.  Pertinent labs & imaging results that were available during  my care of the patient were reviewed by me and considered in my medical decision making (see MDM for details).  Medications Ordered in ED Medications - No data to display                                                                 Medical Decision Making / ED Course   Medical Decision Making:    Shaun Morgan is a 63 y.o. male with PMHx asthma, HTN, HLD, prostate cancer, chronic hep C presents with right ankle pain. The complaint involves an extensive differential diagnosis and also carries with it a high risk of complications and morbidity.  Serious etiology was considered. Ddx includes but is not limited to: sprain, fracture, septic joint, arthritis  Complete initial physical exam performed, notably the patient was in no distress.    Reviewed and confirmed nursing documentation for past medical history, family history, social history.  Vital signs reviewed.     Brief summary:  63 year old male with history as above presenting with right ankle pain after injury. Exam most consistent with sprain but will obtain XR given h/o osteoporosis.  Clinical Course as of 11/08/23 1513  Tue Nov 08, 2023  1513 XR read pending at the time of sign out to oncoming provider. [KH]    Clinical Course User Index [KH] Jonne Netters, MD     Additional history obtained: -External records from outside source obtained and reviewed including: Chart review including previous notes, labs, imaging, consultation notes including: OP Urology notes  Imaging Studies ordered: I ordered imaging studies including right ankle XR, pending read. I independently visualized the following imaging with scope of interpretation limited to determining acute life threatening conditions related to emergency care; findings noted above I agree with the radiologist interpretation If any imaging was obtained with contrast I closely monitored patient for any possible adverse reaction a/w contrast administration in the  emergency department   Medicines ordered and prescription drug management: No orders of the defined types were placed in this encounter.   -I have reviewed the patients home medicines and have made adjustments as needed   Reevaluation: After the interventions noted above, I reevaluated the patient and found that they have improved  Co morbidities that complicate the patient evaluation  Past Medical History:  Diagnosis Date   Asthma    Hepatitis C    per detention nurse   Hypertension    Neuropathy    Syphilis    per detention nurse   Wears partial dentures    upper/lower      Dispostion: Disposition decision including need for hospitalization was considered, and patient discharged from emergency department.    Final Clinical Impression(s) / ED Diagnoses Final diagnoses:  None        Jonne Netters, MD 11/08/23 1513    Tegeler, Marine Sia, MD 11/08/23 [phone]

## 2023-11-08 NOTE — ED Notes (Signed)

## 2023-11-08 NOTE — ED Notes (Signed)
 Pt in bathroom at present, will DC when returns.

## 2023-11-08 NOTE — Discharge Instructions (Addendum)
 You were seen in the emergency department for right ankle injury X-ray did not show any fracture or dislocation This is most likely a right ankle sprain For this reason we gave you an Aircast and crutches to go home with to help get around as this is healing Rest, apply ice packs, apply compression to help with swelling and elevate the ankle Take ibuprofen  as directed for pain and inflammation Follow-up with your PCP within 1 week for reevaluation Return to the emerged apartment for severe pain, if you are unable to walk or have any other concerns

## 2023-11-09 ENCOUNTER — Other Ambulatory Visit (HOSPITAL_COMMUNITY): Payer: Self-pay

## 2023-11-14 ENCOUNTER — Other Ambulatory Visit (HOSPITAL_COMMUNITY): Payer: Self-pay

## 2023-11-14 ENCOUNTER — Ambulatory Visit (INDEPENDENT_AMBULATORY_CARE_PROVIDER_SITE_OTHER): Admitting: Urology

## 2023-11-14 ENCOUNTER — Encounter: Payer: Self-pay | Admitting: Urology

## 2023-11-14 VITALS — BP 130/83 | HR 80 | Ht 69.0 in | Wt 210.0 lb

## 2023-11-14 DIAGNOSIS — R351 Nocturia: Secondary | ICD-10-CM | POA: Diagnosis not present

## 2023-11-14 DIAGNOSIS — N401 Enlarged prostate with lower urinary tract symptoms: Secondary | ICD-10-CM | POA: Diagnosis not present

## 2023-11-14 DIAGNOSIS — M8580 Other specified disorders of bone density and structure, unspecified site: Secondary | ICD-10-CM | POA: Diagnosis not present

## 2023-11-14 DIAGNOSIS — N138 Other obstructive and reflux uropathy: Secondary | ICD-10-CM

## 2023-11-14 DIAGNOSIS — C61 Malignant neoplasm of prostate: Secondary | ICD-10-CM | POA: Diagnosis not present

## 2023-11-14 LAB — URINALYSIS, ROUTINE W REFLEX MICROSCOPIC
Bilirubin, UA: NEGATIVE
Glucose, UA: NEGATIVE
Ketones, UA: NEGATIVE
Leukocytes,UA: NEGATIVE
Nitrite, UA: NEGATIVE
Protein,UA: NEGATIVE
RBC, UA: NEGATIVE
Specific Gravity, UA: 1.02 (ref 1.005–1.030)
Urobilinogen, Ur: 0.2 mg/dL (ref 0.2–1.0)
pH, UA: 6 (ref 5.0–7.5)

## 2023-11-14 MED ORDER — SOLIFENACIN SUCCINATE 5 MG PO TABS
5.0000 mg | ORAL_TABLET | Freq: Every evening | ORAL | 11 refills | Status: AC
  Filled 2023-11-14: qty 30, 30d supply, fill #0
  Filled 2023-11-29 – 2024-03-13 (×2): qty 30, 30d supply, fill #1
  Filled 2024-05-14: qty 30, 30d supply, fill #2

## 2023-11-14 MED ORDER — ALENDRONATE SODIUM 70 MG PO TABS
70.0000 mg | ORAL_TABLET | ORAL | 3 refills | Status: AC
  Filled 2023-11-14: qty 12, 84d supply, fill #0
  Filled 2023-11-29 – 2024-02-22 (×2): qty 12, 84d supply, fill #1
  Filled 2024-05-14: qty 12, 84d supply, fill #2

## 2023-11-14 NOTE — Progress Notes (Signed)
 Assessment: 1. Malignant neoplasm of prostate (HCC); high risk; s/p XRT, on ADT   2. BPH with obstruction/lower urinary tract symptoms   3. Low bone mass- on ADT     Plan: PSA today Continue tamsulosin  0.8 mg nightly Resume solifenacin  5 mg nightly.  Rx sent. I again discussed the recommendation for continuing ADT for total of 2 years due to high risk prostate cancer - next dose due after 02/07/24 Continue daily calcium  and vitamin D supplementation. Continue Fosamax  70 mg weekly Return to office in 3 months  Chief Complaint:  Chief Complaint  Patient presents with   Prostate Cancer    History of Present Illness:  Shaun Morgan is a 63 y.o. male who is seen for continued evaluation of high risk prostate cancer and BPH with LUTS.  He was found to have an elevated PSA of 27.7 and a abnormal prostate exam with firmness on the left.  He underwent a prostate ultrasound and biopsy on 04/02/2022 which demonstrated Gleason 4+4 = 8 adenocarcinoma in 10/12 cores.  Prostate volume measured 48.5 g.  PSMA PET scan from 05/20/2022 was negative for metastatic disease with uptake at the base of the prostate and probable bilateral seminal vesicle involvement. He elected to proceed with ADT and IMRT.  He received a 67-month Eligard  on 07/21/2022. He underwent placement of fiducial markers and SpaceOAR on 09/07/2022.  He developed urinary retention postoperatively requiring Foley catheter placement in the emergency department. His Foley was removed on 10/06/2022. He completed his radiation treatments on 12/24/2022. He was given a 6 month Eligard  on 02/09/23. He received a 84-month Eligard  on 08/11/2023  PSA data: 9/24 <0.1 12/24   <0.1 3/25 <0.1  At his visit in 9/24, he continued with lower urinary tract symptoms including hesitancy, primarily at night, sensation of incomplete emptying, decreased stream, and nocturia every hour.  His dysuria was improving.  No gross hematuria.  He continued on  tamsulosin  0.8 mg nightly. IPSS = 22 QOL = 6/6. At his visit in 12/24, he continued with lower urinary tract symptoms, primarily nocturia x 5.  He reported voiding with a good stream.  No daytime symptoms.  No dysuria or gross hematuria. IPSS = 17. PVR = 25 ml. He was tolerating androgen deprivation therapy well.  No significant hot flashes.  He reported a good appetite.  No bone pain or weight loss. He was given a trial of Solifenacin  5 mg daily at his visit in 12/24.  At his visit in March 2025, he continued on tamsulosin  0.8 mg daily.  He was not  taking the Solifenacin .  He noted improvement when he started taking the Solifenacin  with decrease in his frequency, urgency, and nocturia.  No dysuria or gross hematuria.  He was having more hot flashes.  He reported a good appetite.  He had some weight gain.  No bone pain.  Bone density test from 3/25 showed low bone mass.  He was started on Fosamax  weekly.  He continued on vitamin D and calcium  daily.  He returns today for follow-up.  He continues with fatigue, hot flashes.  He reports a good appetite.  He is trying to lose weight but has not been successful.  No bone pain.  He continues with lower urinary tract symptoms including frequency and nocturia x 5.  No dysuria or gross hematuria.  He continues on tamsulosin  0.8 mg daily.  He has not started the solifenacin . IPSS = 15/6.  Portions of the above documentation were copied  from a prior visit for review purposes only.  Past Medical History:  Past Medical History:  Diagnosis Date   Asthma    Hepatitis C    per detention nurse   Hypertension    Neuropathy    Syphilis    per detention nurse   Wears partial dentures    upper/lower    Past Surgical History:  Past Surgical History:  Procedure Laterality Date   CARPAL TUNNEL RELEASE     GOLD SEED IMPLANT N/A 09/07/2022   Procedure: GOLD SEED IMPLANT;  Surgeon: Mallie Seal, MD;  Location: [facility];  Service:  Urology;  Laterality: N/A;  30   SPACE OAR INSTILLATION N/A 09/07/2022   Procedure: SPACE OAR INSTILLATION;  Surgeon: Mallie Seal, MD;  Location: [facility];  Service: Urology;  Laterality: N/A;    Allergies:  Allergies  Allergen Reactions   Acetaminophen  Other (See Comments)    Hep C  Other Reaction(s): Other (See Comments), Other (See Comments)    Hep C    Family History:  Family History  Problem Relation Age of Onset   Epilepsy Mother    Heart disease Father     Social History:  Social History   Tobacco Use   Smoking status: Former    Current packs/day: 0.00    Types: Cigarettes, E-cigarettes, Pipe    Quit date: 10/2021    Years since quitting: 2.0   Tobacco comments:    Patient incarcerated 10/2021, no nicotine  products allowed since incarceration per detention center nurse        05/02/23- patient vapes non nocotine   Vaping Use   Vaping status: Never Used  Substance Use Topics   Alcohol use: Yes    Alcohol/week: 6.0 standard drinks of alcohol    Types: 6 Standard drinks or equivalent per week   Drug use: Never    ROS: Constitutional:  Negative for fever, chills, weight loss CV: Negative for chest pain, previous MI, hypertension Respiratory:  Negative for shortness of breath, wheezing, sleep apnea, frequent cough GI:  Negative for nausea, vomiting, bloody stool, GERD  Physical exam: BP 130/83   Pulse 80   Ht 5' 9 (1.753 m)   Wt 210 lb (95.3 kg)   BMI 31.01 kg/m  GENERAL APPEARANCE:  Well appearing, well developed, well nourished, NAD HEENT:  Atraumatic, normocephalic, oropharynx clear NECK:  Supple without lymphadenopathy or thyromegaly ABDOMEN:  Soft, non-tender, no masses EXTREMITIES:  Moves all extremities well, without clubbing, cyanosis, or edema NEUROLOGIC:  Alert and oriented x 3, normal gait, CN II-XII grossly intact MENTAL STATUS:  appropriate BACK:  Non-tender to palpation, No CVAT SKIN:  Warm, dry, and  intact  Results: U/A: Negative

## 2023-11-15 ENCOUNTER — Other Ambulatory Visit (HOSPITAL_COMMUNITY): Payer: Self-pay

## 2023-11-15 ENCOUNTER — Ambulatory Visit: Payer: Self-pay | Admitting: Urology

## 2023-11-15 LAB — PSA: Prostate Specific Ag, Serum: <0.1 ng/mL (ref 0.0–4.0)

## 2023-11-23 ENCOUNTER — Ambulatory Visit: Admitting: Infectious Diseases

## 2023-11-24 DIAGNOSIS — K029 Dental caries, unspecified: Secondary | ICD-10-CM | POA: Diagnosis not present

## 2023-11-29 ENCOUNTER — Other Ambulatory Visit: Payer: Self-pay

## 2023-11-29 ENCOUNTER — Other Ambulatory Visit: Payer: Self-pay | Admitting: Emergency Medicine

## 2023-11-29 ENCOUNTER — Other Ambulatory Visit (HOSPITAL_COMMUNITY): Payer: Self-pay

## 2023-11-29 ENCOUNTER — Other Ambulatory Visit: Payer: Self-pay | Admitting: Cardiology

## 2023-11-29 DIAGNOSIS — F17200 Nicotine dependence, unspecified, uncomplicated: Secondary | ICD-10-CM

## 2023-11-29 DIAGNOSIS — I700 Atherosclerosis of aorta: Secondary | ICD-10-CM

## 2023-11-29 DIAGNOSIS — E7800 Pure hypercholesterolemia, unspecified: Secondary | ICD-10-CM

## 2023-11-29 MED ORDER — NICOTINE 14 MG/24HR TD PT24
14.0000 mg | MEDICATED_PATCH | Freq: Every day | TRANSDERMAL | 0 refills | Status: AC
Start: 2023-11-29 — End: ?
  Filled 2023-11-29 – 2024-05-14 (×3): qty 28, 28d supply, fill #0

## 2023-12-05 ENCOUNTER — Other Ambulatory Visit (HOSPITAL_COMMUNITY): Payer: Self-pay

## 2023-12-09 ENCOUNTER — Other Ambulatory Visit (HOSPITAL_COMMUNITY): Payer: Self-pay

## 2023-12-19 LAB — AMB RESULTS CONSOLE CBG: Glucose: 138

## 2023-12-19 NOTE — Progress Notes (Signed)
No sdoh

## 2023-12-20 ENCOUNTER — Other Ambulatory Visit (HOSPITAL_COMMUNITY): Payer: Self-pay

## 2023-12-21 ENCOUNTER — Telehealth: Payer: Self-pay

## 2023-12-21 NOTE — Telephone Encounter (Signed)
 [facility] CSW Progress Note  Patient left Vm stating he would like a call back for counseling regarding his injection, adding that he needs classes or education to know what he is going through. CSW attempted to reach patient to assess further. No answer, left vm    Lizbeth Sprague, LCSW Clinical Social Worker [facility]

## 2023-12-22 ENCOUNTER — Other Ambulatory Visit (HOSPITAL_COMMUNITY): Payer: Self-pay

## 2023-12-22 ENCOUNTER — Other Ambulatory Visit (INDEPENDENT_AMBULATORY_CARE_PROVIDER_SITE_OTHER): Payer: Self-pay

## 2023-12-22 ENCOUNTER — Ambulatory Visit: Admitting: Orthopedic Surgery

## 2023-12-22 DIAGNOSIS — M25512 Pain in left shoulder: Secondary | ICD-10-CM

## 2023-12-22 NOTE — Progress Notes (Signed)
 Orthopedic Spine Surgery Office Note   Assessment: Patient is a 63 y.o. male with left shoulder pain consistent with osteoarthritis     Plan: -Patient has had left shoulder pain now for over three years in spite of conservative treatments including: Tylenol , Aleve , narcotics, intra-articular injections. For that reason, discussed surgery as a next treatment option.  He was interested in this option, so placed a referral to one of my partners that does shoulder arthroplasty  -Patient should return to my office on an as needed basis   ___________________________________________________________________________     History:   Patient is a 63 y.o. male who who was previously been seen in the office for left shoulder pain.  He returns today to discuss the left shoulder pain.  He feels the shoulder pain on a daily basis.  He has it with any activity of the shoulder especially overhead activity.  Pain is better with rest.  He has difficulty lifting his arm above the level of his shoulder.  He feels that pain has gotten progressively worse since he was last seen in the office for this issue.  Otherwise, he has not noticed any changes in his left shoulder symptoms.  He did notice significant improvement with the injection that was done at the last office visit but it eventually wore off and pain returned to the same level after it wore off.   Treatments tried: Tylenol , lidocaine  gel, intraarticular injection, narcotics      Physical Exam:   General: no acute distress, appears stated age Neurologic: alert, answering questions appropriately, following commands Respiratory: unlabored breathing on room air, symmetric chest rise Psychiatric: appropriate affect, normal cadence to speech     MSK (LUE): Pain and weakness with Jobe test, pain with end of external and internal rotation, able to forward flex to 100 degrees, no weakness with external rotation with arm at side, negative belly press    Imaging: XRs of the left shoulder from 12/22/2023 were independently reviewed and interpreted, showing significant degenerative changes in the glenohumeral joint including joint space narrowing, subchondral sclerosis, and osteophyte formation.  No fracture or dislocation seen.     Patient name: Shaun Morgan Patient MRN: [id number] Date of visit: 12/22/23

## 2023-12-30 ENCOUNTER — Other Ambulatory Visit: Payer: Self-pay

## 2023-12-30 ENCOUNTER — Encounter: Payer: Self-pay | Admitting: Infectious Diseases

## 2023-12-30 ENCOUNTER — Ambulatory Visit: Admitting: Infectious Diseases

## 2023-12-30 VITALS — BP 116/81 | HR 88 | Temp 97.8°F | Ht 69.0 in | Wt 235.0 lb

## 2023-12-30 DIAGNOSIS — Z23 Encounter for immunization: Secondary | ICD-10-CM | POA: Diagnosis not present

## 2023-12-30 DIAGNOSIS — B182 Chronic viral hepatitis C: Secondary | ICD-10-CM

## 2023-12-30 NOTE — Patient Instructions (Signed)
 Please come back in 2 weeks for a lab only appointment

## 2023-12-30 NOTE — Progress Notes (Signed)
 Patient Name: Shaun Morgan  Date of Birth: 06-24-1960  MRN: [id number]  PCP: Shaun Waddell NOVAK, NP  Referring Provider: Almarie Waddell NOVAK, NP, Ph#: [phone]   Subjective   Subjective:   Chief Complaint  Patient presents with   Follow-up    Medication: Epclusa  x 12 weeks   Start Date: 05/09/2023   Hepatitis C Genotype: 1a   Fibrosis Score: F0-F1   HPI:  Shaun Morgan is a 63 year old male with chronic hepatitis C who presents for follow-up after completing Epclusa  treatment.  He completed a twelve-week course of Epclusa  for chronic hepatitis C, genotype 1A, with fibrosis risk F1. He last saw the infectious disease pharmacy team on March 11th. During the treatment, he held his simvastatin  to decrease the risk of myalgias and other side effects. No significant side effects from the Epclusa  were noted, and he stated, 'If I did, I didn't recognize it.'  He is due for his second hepatitis A vaccine.   Review of Systems  Constitutional:  Negative for malaise/fatigue and weight loss.  HENT:  Negative for nosebleeds.   Gastrointestinal:  Negative for abdominal pain, diarrhea and vomiting.  All other systems reviewed and are negative.    Past Medical History:  Diagnosis Date   Asthma    Hepatitis C    per detention nurse   Hypertension    Neuropathy    Syphilis    per detention nurse   Wears partial dentures    upper/lower    Outpatient Medications Prior to Visit  Medication Sig Dispense Refill   albuterol  (VENTOLIN  HFA) 108 (90 Base) MCG/ACT inhaler Inhale 2 puffs into the lungs every 6 (six) hours as needed for wheezing or shortness of breath. 18 g 2   alendronate  (FOSAMAX ) 70 MG tablet Take 1 tablet (70 mg total) by mouth once a week. Take with a full glass of water on an empty stomach. 12 tablet 3   atorvastatin  (LIPITOR) 20 MG tablet Take 1 tablet (20 mg total) by mouth daily. 90 tablet 0   bisacodyl  (DULCOLAX) 10 MG suppository Place 1 suppository  (10 mg total) rectally as needed for moderate constipation. 12 suppository 0   Calcium  Carb-Cholecalciferol  (CALCIUM  + VITAMIN D3) 600-10 MG-MCG TABS Take 1 tablet by mouth 2 (two) times daily with a meal. 60 tablet 2   fluticasone -salmeterol (WIXELA INHUB) 100-50 MCG/ACT AEPB Inhale 1 puff into the lungs 2 (two) times daily. 1 each 3   gabapentin  (NEURONTIN ) 300 MG capsule Take 1 capsule (300 mg total) by mouth 3 (three) times daily. 90 capsule 3   hydrocortisone  cream 1 % Apply 1 Application topically 2 (two) times daily. 30 g 0   ketoconazole  (NIZORAL ) 2 % cream Apply 1 Application topically daily. 60 g 0   lidocaine  (XYLOCAINE ) 5 % ointment Apply 1 Application topically as needed. 35.44 g 0   lisinopril-hydrochlorothiazide  (ZESTORETIC) 20-25 MG tablet Take 1 tablet by mouth daily.     nicotine  (NICODERM CQ  - DOSED IN MG/24 HOURS) 14 mg/24hr patch Place 1 patch (14 mg total) onto the skin daily. 28 patch 0   oxyCODONE  (ROXICODONE ) 5 MG immediate release tablet Take 1 tablet (5 mg total) by mouth every 6 (six) hours as needed for severe pain (pain score 7-10). 15 tablet 0   oxyCODONE -acetaminophen  (PERCOCET/ROXICET) 5-325 MG tablet Take 1 tablet by mouth every 6 (six) hours as needed for severe pain (pain score 7-10). 10 tablet 0   polyethylene glycol (MIRALAX ) 17 g  packet Take 17 g by mouth daily. 30 each 1   solifenacin  (VESICARE ) 5 MG tablet Take 1 tablet (5 mg total) by mouth at bedtime. 30 tablet 11   tamsulosin  (FLOMAX ) 0.4 MG CAPS capsule Take 2 capsules (0.8 mg total) by mouth daily after supper. 180 capsule 1   No facility-administered medications prior to visit.     Allergies  Allergen Reactions   Acetaminophen  Other (See Comments)    Hep C  Other Reaction(s): Other (See Comments), Other (See Comments)    Hep C    Social History   Tobacco Use   Smoking status: Former    Current packs/day: 0.00    Types: Cigarettes, E-cigarettes, Pipe    Quit date: 10/2021    Years since  quitting: 2.1   Tobacco comments:    Patient incarcerated 10/2021, no nicotine  products allowed since incarceration per detention center nurse        05/02/23- patient vapes non nocotine   Vaping Use   Vaping status: Never Used  Substance Use Topics   Alcohol use: Not Currently    Alcohol/week: 6.0 standard drinks of alcohol    Types: 6 Standard drinks or equivalent per week   Drug use: Never    Family History  Problem Relation Age of Onset   Epilepsy Mother    Heart disease Father        Objective   Objective:   Vitals:   12/30/23 0827  BP: 116/81  Pulse: 88  Temp: 97.8 F (36.6 C)  TempSrc: Temporal  SpO2: 97%  Weight: 235 lb (106.6 kg)  Height: 5' 9 (1.753 m)   Body mass index is 34.7 kg/m.  Constitutional: in no apparent distress, well developed and well nourished, and oriented times 3 Eyes: anicteric Cardiovascular: Cor RRR and No murmurs Respiratory: clear Gastrointestinal: Bowel sounds are normal, liver is not enlarged, spleen is not enlarged Musculoskeletal: peripheral pulses normal, no pedal edema, no clubbing or cyanosis Skin: negative for - jaundice, spider hemangioma, telangiectasia, palmar erythema, ecchymosis and atrophy; no porphyria cutanea tarda Lymphatic: no cervical lymphadenopathy      Assessment & Plan:  Chronic hepatitis C-  Genotype 1A, post-treatment monitoring Completed twelve weeks of Epclusa  with fibrosis risk F1. No significant side effects reported. He is about 10 weeks out from last dose. Will schedule a lab visit in 2 weeks to assess SVR. Discussed his reactive Hep C antibody will persist, but viral detection will confirm cure. No significant fibrosis - General liver health measures applies. No need for HCC screening going forward.   - Schedule lab appointment in two weeks for viral level check and liver function tests. - Repeat LFTs   Hepatitis A immunization, pending completion Due for second hepatitis A vaccine as part of  preventative care. - Administer second hepatitis A vaccine today.       Orders Placed This Encounter  Procedures   Hepatitis A vaccine adult IM   Hepatitis C RNA quantitative ([facility])    Standing Status:   Future    Expected Date:   01/13/2024    Expiration Date:   12/29/2024   Hepatic function panel    Standing Status:   Future    Expected Date:   01/13/2024    Expiration Date:   12/29/2024    No orders of the defined types were placed in this encounter.   No follow-ups on file.  Corean Fireman, MSN, NP-C [facility] for Infectious Disease [facility]  [email or web address]  Pager: [phone] Office: [phone] [facility] Main Line: [phone] *Secure Chat Communication Welcome

## 2024-01-02 ENCOUNTER — Ambulatory Visit (INDEPENDENT_AMBULATORY_CARE_PROVIDER_SITE_OTHER): Admitting: Orthopedic Surgery

## 2024-01-02 ENCOUNTER — Encounter: Payer: Self-pay | Admitting: Orthopedic Surgery

## 2024-01-02 DIAGNOSIS — B353 Tinea pedis: Secondary | ICD-10-CM | POA: Diagnosis not present

## 2024-01-02 NOTE — Progress Notes (Signed)
 Office Visit Note   Patient: Shaun Morgan           Date of Birth: [birth year]           MRN: [id number] Visit Date: 01/02/2024              Requested by: Almarie Waddell NOVAK, NP [address] [place],  [place] [address] PCP: Almarie Waddell NOVAK, NP  Chief Complaint  Patient presents with   Right Leg - Wound Check      HPI: Patient is a 63 year old gentleman who presents with a fungal rash dorsum of the right foot.  Patient states he has had this for 30 years.  Assessment & Plan: Visit Diagnoses:  1. Tinea pedis of right foot     Plan: Recommended Dial soap cleansing athletic foot powder or spray, wool socks daily and avoid shoes that he has been wearing barefoot.  Follow-Up Instructions: Return if symptoms worsen or fail to improve.   Ortho Exam  Patient is alert, oriented, no adenopathy, well-dressed, normal affect, normal respiratory effort. Examination patient has a palpable pulse.  He has a dry fungal rash on the dorsum of his foot that extends from the dorsum of the 2nd-5th metatarsal head.  There is no open wound there is no cellulitis no drainage.  Patient is currently on medication for osteoporosis secondary to medication for his prostate cancer.  I have reviewed the patient's history and given the presence of a fragility fracture, I have deemed the necessity of a osteoporosis management referral or confirmed that the patient is currently enrolled in a osteoporosis treatment program.     Imaging: No results found. No images are attached to the encounter.  Labs: Lab Results  Component Value Date   HGBA1C 4.8 04/06/2023     Lab Results  Component Value Date   ALBUMIN 3.6 07/24/2023   ALBUMIN 3.7 07/21/2023   ALBUMIN 4.0 07/19/2023    Lab Results  Component Value Date   MG 1.9 06/07/2023   No results found for: VD25OH  No results found for: PREALBUMIN    Latest Ref Rng & Units 07/24/2023   11:43 PM 07/21/2023   11:14 AM  07/19/2023    9:06 AM  CBC EXTENDED  WBC 4.0 - 10.5 K/uL 5.2  4.0  5.1   RBC 4.22 - 5.81 MIL/uL 3.33  3.45  3.33   Hemoglobin 13.0 - 17.0 g/dL 89.3  88.6  89.0   HCT 39.0 - 52.0 % 33.0  34.6  32.4   Platelets 150 - 400 K/uL 144  144  151.0   NEUT# 1.7 - 7.7 K/uL 3.1   3.6   Lymph# 0.7 - 4.0 K/uL 1.3   0.9      There is no height or weight on file to calculate BMI.  Orders:  No orders of the defined types were placed in this encounter.  No orders of the defined types were placed in this encounter.    Procedures: No procedures performed  Clinical Data: No additional findings.  ROS:  All other systems negative, except as noted in the HPI. Review of Systems  Objective: Vital Signs: There were no vitals taken for this visit.  Specialty Comments:  No specialty comments available.  PMFS History: Patient Active Problem List   Diagnosis Date Noted   Mixed hyperlipidemia 05/25/2023   Nocturia 05/12/2023   Chronic left shoulder pain 04/06/2023   Neuropathy 04/06/2023   Chronic right hip pain 04/06/2023  Chronic right-sided low back pain with right-sided sciatica 04/06/2023   Asthma    Hypertension    BPH with obstruction/lower urinary tract symptoms 02/07/2023   Malignant neoplasm of prostate (HCC) 07/12/2022   Past Medical History:  Diagnosis Date   Asthma    Hepatitis C    per detention nurse   Hypertension    Neuropathy    Syphilis    per detention nurse   Wears partial dentures    upper/lower    Family History  Problem Relation Age of Onset   Epilepsy Mother    Heart disease Father     Past Surgical History:  Procedure Laterality Date   CARPAL TUNNEL RELEASE     GOLD SEED IMPLANT N/A 09/07/2022   Procedure: GOLD SEED IMPLANT;  Surgeon: Lovie Arlyss CROME, MD;  Location: [facility];  Service: Urology;  Laterality: N/A;  30   SPACE OAR INSTILLATION N/A 09/07/2022   Procedure: SPACE OAR INSTILLATION;  Surgeon: Lovie Arlyss CROME, MD;  Location:  [facility];  Service: Urology;  Laterality: N/A;   Social History   Occupational History   Not on file  Tobacco Use   Smoking status: Former    Current packs/day: 0.00    Types: Cigarettes, E-cigarettes, Pipe    Quit date: 10/2021    Years since quitting: 2.1   Smokeless tobacco: Not on file   Tobacco comments:    Patient incarcerated 10/2021, no nicotine  products allowed since incarceration per detention center nurse        05/02/23- patient vapes non nocotine   Vaping Use   Vaping status: Never Used  Substance and Sexual Activity   Alcohol use: Not Currently    Alcohol/week: 6.0 standard drinks of alcohol    Types: 6 Standard drinks or equivalent per week   Drug use: Never   Sexual activity: Not Currently

## 2024-01-04 ENCOUNTER — Inpatient Hospital Stay: Attending: Radiation Oncology

## 2024-01-04 NOTE — Progress Notes (Signed)
 [facility] CSW Progress Note  Patient called Visual merchandiser (CSW) on this date. Patient expressed needing assistance with understanding the mental health effects of his ADT treatment. Patient is noticing increased sadness and is wanting assistance with managing the reported symptoms. Patient denies and active SI/HI. Patient is aware of crisis resources should it be needed.    Interventions: CSW sent message at the request of patient to Prostate Nurse Navigator for treatment specific questions  CSW discussed mental health options related to individual therapy and medication management.       Follow Up Plan:  Patient elected to attend Prostate Support Group. Patient's nurse navigator will discuss further. Patient has my direct contact if needed.  Lizbeth Sprague, LCSW Clinical Social Worker [facility]

## 2024-01-11 ENCOUNTER — Ambulatory Visit (INDEPENDENT_AMBULATORY_CARE_PROVIDER_SITE_OTHER): Admitting: Orthopedic Surgery

## 2024-01-11 DIAGNOSIS — M19012 Primary osteoarthritis, left shoulder: Secondary | ICD-10-CM | POA: Diagnosis not present

## 2024-01-12 ENCOUNTER — Encounter: Payer: Self-pay | Admitting: Orthopedic Surgery

## 2024-01-12 NOTE — Progress Notes (Signed)
 Office Visit Note   Patient: Shaun Morgan           Date of Birth: [birth year]           MRN: [id number] Visit Date: 01/11/2024 Requested by: Georgina Ozell LABOR, MD [address] [place],  [place] [address] PCP: Almarie Waddell NOVAK, NP  Subjective: Chief Complaint  Patient presents with   Other    Surgery consult for chronic shoulder pain    HPI: Shaun Morgan is a 63 y.o. male who presents to the office reporting left shoulder pain.  Patient describes chronic shoulder pain for 3 years.  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  He has tried over-the-counter medicine along with intra-articular injection.  Patient has good and bad days.  At times he describes decreased range of motion.  Overall he is then trending better in terms of his pain.  Denies any radicular component to his shoulder pain.  At times he does have level 7 out of 10 pain and he cannot sleep but again that has been trending better recently..                ROS: All systems reviewed are negative as they relate to the chief complaint within the history of present illness.  Patient denies fevers or chills.  Assessment & Plan: Visit Diagnoses: No diagnosis found.  Plan: Impression is left shoulder arthritis which appears worse radiographically than how it is affecting him clinically..  We talked about another injection today.  Lasted only a helped him for about a month.  He has surprisingly good range of motion actively and passively with the shoulder and good rotator cuff strength.  We are going to watch this for now and see him back in 6 months to see how he progresses.  Follow-up at that time for repeat clinical assessment  Follow-Up Instructions: No follow-ups on file.   Orders:  No orders of the defined types were placed in this encounter.  No orders of the defined types were placed in this encounter.     Procedures: No procedures performed   Clinical Data: No additional  findings.  Objective: Vital Signs: There were no vitals taken for this visit.  Physical Exam:  Constitutional: Patient appears well-developed HEENT:  Head: Normocephalic Eyes:EOM are normal Neck: Normal range of motion Cardiovascular: Normal rate Pulmonary/chest: Effort normal Neurologic: Patient is alert Skin: Skin is warm Psychiatric: Patient has normal mood and affect  Ortho Exam: Ortho exam demonstrates range of motion on the left of 60/100/165.  Rotator cuff strength is intact to internal/external rotation at 15 degrees of abduction.  Supraspinatus strength also intact to resisted abduction.  Does have some coarseness which is mild with passive range of motion of the shoulder at 90 degrees of abduction.  Deltoid fires nicely.  No discrete AC joint tenderness on the left.  Motor or sensory function to the arm is intact.  Specialty Comments:  No specialty comments available.  Imaging: No results found.   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   Malignant neoplasm of prostate (HCC) 07/12/2022   Past Medical History:  Diagnosis Date   Asthma    Hepatitis C    per detention nurse   Hypertension    Neuropathy    Syphilis    per detention nurse   Wears partial dentures    upper/lower    Family History  Problem Relation Age of Onset   Epilepsy Mother    Heart disease Father     Past Surgical History:  Procedure Laterality Date   CARPAL TUNNEL RELEASE     GOLD SEED IMPLANT N/A 09/07/2022   Procedure: GOLD SEED IMPLANT;  Surgeon: Lovie Arlyss CROME, MD;  Location: [facility];  Service: Urology;  Laterality: N/A;   30   SPACE OAR INSTILLATION N/A 09/07/2022   Procedure: SPACE OAR INSTILLATION;  Surgeon: Lovie Arlyss CROME, MD;  Location: [facility];  Service: Urology;  Laterality: N/A;   Social History   Occupational History   Not on file  Tobacco Use   Smoking status: Former    Current packs/day: 0.00    Types: Cigarettes, E-cigarettes, Pipe    Quit date: 10/2021    Years since quitting: 2.2   Smokeless tobacco: Not on file   Tobacco comments:    Patient incarcerated 10/2021, no nicotine  products allowed since incarceration per detention center nurse        05/02/23- patient vapes non nocotine   Vaping Use   Vaping status: Never Used  Substance and Sexual Activity   Alcohol use: Not Currently    Alcohol/week: 6.0 standard drinks of alcohol    Types: 6 Standard drinks or equivalent per week   Drug use: Never   Sexual activity: Not Currently

## 2024-01-13 ENCOUNTER — Encounter: Admitting: Orthopedic Surgery

## 2024-01-13 ENCOUNTER — Other Ambulatory Visit: Payer: Self-pay

## 2024-01-13 ENCOUNTER — Other Ambulatory Visit

## 2024-01-13 DIAGNOSIS — B182 Chronic viral hepatitis C: Secondary | ICD-10-CM | POA: Diagnosis not present

## 2024-01-15 ENCOUNTER — Ambulatory Visit: Payer: Self-pay | Admitting: Infectious Diseases

## 2024-01-15 LAB — HEPATITIS C RNA QUANTITATIVE
HCV Quantitative Log: 6.6 {Log_IU}/mL — ABNORMAL HIGH
HCV RNA, PCR, QN: 4020000 [IU]/mL — ABNORMAL HIGH

## 2024-01-15 LAB — HEPATIC FUNCTION PANEL
AG Ratio: 1.2 (calc) (ref 1.0–2.5)
ALT: 68 U/L — ABNORMAL HIGH (ref 9–46)
AST: 51 U/L — ABNORMAL HIGH (ref 10–35)
Albumin: 4.2 g/dL (ref 3.6–5.1)
Alkaline phosphatase (APISO): 68 U/L (ref 35–144)
Bilirubin, Direct: 0.1 mg/dL (ref 0.0–0.2)
Globulin: 3.6 g/dL (ref 1.9–3.7)
Indirect Bilirubin: 0.6 mg/dL (ref 0.2–1.2)
Total Bilirubin: 0.7 mg/dL (ref 0.2–1.2)
Total Protein: 7.8 g/dL (ref 6.1–8.1)

## 2024-01-16 NOTE — Telephone Encounter (Signed)
 Spoke with Sudie, discussed that HCV treatment was not successful at curing the virus and that Corean would like for him to come in to discuss alternative medication. Scheduled for 9/9.  Liviah Cake, BSN, RN

## 2024-01-24 ENCOUNTER — Other Ambulatory Visit: Payer: Self-pay | Admitting: Cardiology

## 2024-01-24 ENCOUNTER — Other Ambulatory Visit (HOSPITAL_COMMUNITY): Payer: Self-pay

## 2024-01-24 ENCOUNTER — Other Ambulatory Visit: Payer: Self-pay | Admitting: Family Medicine

## 2024-01-24 ENCOUNTER — Other Ambulatory Visit: Payer: Self-pay

## 2024-01-24 DIAGNOSIS — I700 Atherosclerosis of aorta: Secondary | ICD-10-CM

## 2024-01-24 DIAGNOSIS — E7800 Pure hypercholesterolemia, unspecified: Secondary | ICD-10-CM

## 2024-01-24 MED ORDER — ATORVASTATIN CALCIUM 20 MG PO TABS
20.0000 mg | ORAL_TABLET | Freq: Every day | ORAL | 0 refills | Status: DC
  Filled 2024-01-24: qty 90, 90d supply, fill #0

## 2024-01-29 ENCOUNTER — Other Ambulatory Visit: Payer: Self-pay

## 2024-01-29 ENCOUNTER — Encounter (HOSPITAL_COMMUNITY): Payer: Self-pay | Admitting: Radiology

## 2024-01-29 ENCOUNTER — Emergency Department (HOSPITAL_COMMUNITY)
Admission: EM | Admit: 2024-01-29 | Discharge: 2024-01-29 | Disposition: A | Discharge status: Home / Self Care | Attending: Emergency Medicine | Admitting: Emergency Medicine

## 2024-01-29 DIAGNOSIS — R519 Headache, unspecified: Secondary | ICD-10-CM | POA: Diagnosis not present

## 2024-01-29 DIAGNOSIS — Z760 Encounter for issue of repeat prescription: Secondary | ICD-10-CM | POA: Diagnosis not present

## 2024-01-29 DIAGNOSIS — I700 Atherosclerosis of aorta: Secondary | ICD-10-CM

## 2024-01-29 DIAGNOSIS — J45909 Unspecified asthma, uncomplicated: Secondary | ICD-10-CM | POA: Diagnosis not present

## 2024-01-29 DIAGNOSIS — E7800 Pure hypercholesterolemia, unspecified: Secondary | ICD-10-CM

## 2024-01-29 DIAGNOSIS — I10 Essential (primary) hypertension: Secondary | ICD-10-CM | POA: Insufficient documentation

## 2024-01-29 LAB — COMPREHENSIVE METABOLIC PANEL WITH GFR
ALT: 49 U/L — ABNORMAL HIGH (ref 0–44)
AST: 49 U/L — ABNORMAL HIGH (ref 15–41)
Albumin: 3.6 g/dL (ref 3.5–5.0)
Alkaline Phosphatase: 78 U/L (ref 38–126)
Anion gap: 12 (ref 5–15)
BUN: 30 mg/dL — ABNORMAL HIGH (ref 8–23)
CO2: 20 mmol/L — ABNORMAL LOW (ref 22–32)
Calcium: 9.2 mg/dL (ref 8.9–10.3)
Chloride: 104 mmol/L (ref 98–111)
Creatinine, Ser: 1.56 mg/dL — ABNORMAL HIGH (ref 0.61–1.24)
GFR, Estimated: 50 mL/min — ABNORMAL LOW (ref >60)
Glucose, Bld: 113 mg/dL — ABNORMAL HIGH (ref 70–99)
Potassium: 4.1 mmol/L (ref 3.5–5.1)
Sodium: 136 mmol/L (ref 135–145)
Total Bilirubin: 0.5 mg/dL (ref 0.0–1.2)
Total Protein: 7.2 g/dL (ref 6.5–8.1)

## 2024-01-29 MED ORDER — ATORVASTATIN CALCIUM 20 MG PO TABS
20.0000 mg | ORAL_TABLET | Freq: Every day | ORAL | 0 refills | Status: AC
Start: 2024-01-29 — End: 2024-05-01
  Filled 2024-02-01: qty 90, 90d supply, fill #0

## 2024-01-29 MED ORDER — LISINOPRIL-HYDROCHLOROTHIAZIDE 20-25 MG PO TABS
1.0000 | ORAL_TABLET | Freq: Every day | ORAL | 0 refills | Status: DC
  Filled 2024-01-29 – 2024-03-01 (×2): qty 30, 30d supply, fill #0

## 2024-01-29 MED ORDER — NAPROXEN 250 MG PO TABS
500.0000 mg | ORAL_TABLET | Freq: Once | ORAL | Status: AC
  Administered 2024-01-29: 500 mg via ORAL
  Filled 2024-01-29: qty 2

## 2024-01-29 NOTE — ED Triage Notes (Signed)
 States he ran out of his hydrochlorothiazide  and Lisinopril  yesterday. Today he has been seeing yellow spots on and off. He also has a headache now.

## 2024-01-29 NOTE — ED Provider Notes (Signed)
 [place] [facility] DE[facility]MENT AT [facility] Provider Note   CSN: [id number] Arrival date & time: 01/29/24  2003     Patient presents with: Hypertension   Shaun Morgan is a 63 y.o. male.   Hypertension Associated symptoms include headaches.   Patient is a 63 year old male was in the ED today for concerns for running out of his HCTZ as well as his lisinopril .  Saying that his blood pressure was a little bit elevated today and caused him to have a headache as well as seeing some spots.  Noted that since he has rested and his blood pressures come down he has all symptoms improved.  Does not wish to have any workup at this time but is wishing to have blood pressure medications refilled as he is concerned his primary care will not get to them in time.  Asymptomatic otherwise.  Pres medical history of asthma, syphilis, HTN chronic hepatitis C, currently being worked up by infectious disease  Denies fever, vision changes, vertigo, hearing changes, dysphagia, odynophagia, unilateral weakness, chest pain, shortness of breath, abdominal pain, nausea, vomiting, diarrhea, dysuria, lower leg swelling    Prior to Admission medications   Medication Sig Start Date End Date Taking? Authorizing Provider  albuterol  (VENTOLIN  HFA) 108 (90 Base) MCG/ACT inhaler Inhale 2 puffs into the lungs every 6 (six) hours as needed for wheezing or shortness of breath. 08/25/23   Almarie Waddell NOVAK, NP  alendronate  (FOSAMAX ) 70 MG tablet Take 1 tablet (70 mg total) by mouth once a week. Take with a full glass of water on an empty stomach. 11/14/23   Stoneking, Adine PARAS., MD  atorvastatin  (LIPITOR) 20 MG tablet Take 1 tablet (20 mg total) by mouth daily. 01/29/24 04/28/24  Perez Dirico S, PA-C  bisacodyl  (DULCOLAX) 10 MG suppository Place 1 suppository (10 mg total) rectally as needed for moderate constipation. 06/29/23   Theadore Ozell HERO, MD  Calcium  Carb-Cholecalciferol  (CALCIUM  + VITAMIN D3) 600-10 MG-MCG  TABS Take 1 tablet by mouth 2 (two) times daily with a meal. 12/27/22   Bruning, Ashlyn, PA-C  fluticasone -salmeterol (WIXELA INHUB) 100-50 MCG/ACT AEPB Inhale 1 puff into the lungs 2 (two) times daily. 04/06/23   Almarie Waddell NOVAK, NP  gabapentin  (NEURONTIN ) 300 MG capsule Take 1 capsule (300 mg total) by mouth 3 (three) times daily. 06/06/23   Almarie Waddell NOVAK, NP  hydrocortisone  cream 1 % Apply 1 Application topically 2 (two) times daily. 12/27/22   Bruning, Ashlyn, PA-C  ketoconazole  (NIZORAL ) 2 % cream Apply 1 Application topically daily. 06/03/23   Gershon Donnice SAUNDERS, DPM  lidocaine  (XYLOCAINE ) 5 % ointment Apply 1 Application topically as needed. 01/07/23   Randol Simmonds, MD  lisinopril -hydrochlorothiazide  (ZESTORETIC ) 20-25 MG tablet Take 1 tablet by mouth daily. 01/29/24   Shaia Porath S, PA-C  nicotine  (NICODERM CQ  - DOSED IN MG/24 HOURS) 14 mg/24hr patch Place 1 patch (14 mg total) onto the skin daily. 11/29/23   Ladona Heinz, MD  oxyCODONE  (ROXICODONE ) 5 MG immediate release tablet Take 1 tablet (5 mg total) by mouth every 6 (six) hours as needed for severe pain (pain score 7-10). 07/14/23   Doretha Folks, MD  oxyCODONE -acetaminophen  (PERCOCET/ROXICET) 5-325 MG tablet Take 1 tablet by mouth every 6 (six) hours as needed for severe pain (pain score 7-10). 06/25/23   Haviland, Julie, MD  polyethylene glycol (MIRALAX ) 17 g packet Take 17 g by mouth daily. 06/29/23   Theadore Ozell HERO, MD  solifenacin  (VESICARE ) 5 MG tablet Take 1  tablet (5 mg total) by mouth at bedtime. 11/14/23   Stoneking, Adine PARAS., MD  tamsulosin  (FLOMAX ) 0.4 MG CAPS capsule Take 2 capsules (0.8 mg total) by mouth daily after supper. 04/12/23   Almarie Waddell NOVAK, NP    Allergies: Acetaminophen     Review of Systems  Neurological:  Positive for headaches.  All other systems reviewed and are negative.   Updated Vital Signs BP (!) 129/97   Pulse 79   Temp 98.3 F (36.8 C)   Resp 18   Ht 5' 9 (1.753 m)   Wt 106.6 kg   SpO2 97%   BMI  34.70 kg/m   Physical Exam Vitals and nursing note reviewed.  Constitutional:      General: He is not in acute distress.    Appearance: Normal appearance. He is not ill-appearing or diaphoretic.  HENT:     Head: Normocephalic and atraumatic.  Eyes:     General: No scleral icterus.       Right eye: No discharge.        Left eye: No discharge.     Extraocular Movements: Extraocular movements intact.     Conjunctiva/sclera: Conjunctivae normal.  Cardiovascular:     Rate and Rhythm: Normal rate and regular rhythm.     Pulses: Normal pulses.     Heart sounds: Normal heart sounds. No murmur heard.    No friction rub. No gallop.  Pulmonary:     Effort: Pulmonary effort is normal. No respiratory distress.     Breath sounds: No stridor. No wheezing, rhonchi or rales.  Chest:     Chest wall: No tenderness.  Abdominal:     General: Abdomen is flat. There is no distension.     Palpations: Abdomen is soft.     Tenderness: There is no abdominal tenderness. There is no right CVA tenderness, left CVA tenderness, guarding or rebound.  Musculoskeletal:        General: No swelling, deformity or signs of injury.     Cervical back: Normal range of motion. No rigidity.     Right lower leg: No edema.     Left lower leg: No edema.  Skin:    General: Skin is warm and dry.     Findings: No bruising, erythema or lesion.  Neurological:     General: No focal deficit present.     Mental Status: He is alert and oriented to person, place, and time. Mental status is at baseline.     Sensory: No sensory deficit.     Motor: No weakness.     Comments: No facial asymmetry, no ataxia, no apraxia, no aphasia, no arm drift, normal coordination with finger-to-nose, normal sensation to both upper and lower extremities bilaterally, normal grip strength bilaterally, normal strength to both flexion and extension to both upper lower extremities 5+ bilaterally, no visual field deficits, no nystagmus.   Psychiatric:         Mood and Affect: Mood normal.     (all labs ordered are listed, but only abnormal results are displayed) Labs Reviewed  COMPREHENSIVE METABOLIC PANEL WITH GFR - Abnormal; Notable for the following components:      Result Value   CO2 20 (*)    Glucose, Bld 113 (*)    BUN 30 (*)    Creatinine, Ser 1.56 (*)    AST 49 (*)    ALT 49 (*)    GFR, Estimated 50 (*)    All other components within normal limits  EKG: None  Radiology: No results found.  Procedures   Medications Ordered in the ED  naproxen  (NAPROSYN ) tablet 500 mg (500 mg Oral Given 01/29/24 2145)      Medical Decision Making Amount and/or Complexity of Data Reviewed Labs: ordered.  Risk Prescription drug management.   This patient is a 63 year old male who presents to the ED for concern of headache that had started 30 minutes before arriving to the ER but abated by the time evaluation and started.  Noting to have seen some yellow spots in that time.  But currently does not have any symptoms.  States he came in today because he was concerned that his medication would not get approved and time for refills for his lisinopril  and hydrochlorothiazide  as well as atorvastatin .    On physical exam, patient is in no acute distress, afebrile, alert and orient x 4, speaking in full sentences, nontachypneic, nontachycardic.  LCTAB, RRR, no murmur, no abdominal tenderness to palpation, no lower leg edema, normal neuroexam.  With patient's reassuring physical exam, no symptoms at this time and is ambulatory without difficulty, will refill medications.  Provided naproxen .  Patient was observed in the ED for extended period, still having no recurrence of symptoms and is willing to go at this time.  Low suspicion for any emergent causes including low suspicion for CVA, acute angle glaucoma.  Suspecting headache and vision changes were correlated with migraine aura.  Will have him continue to follow-up with infectious disease  for chronic hepatitis C.  Patient vital signs have remained stable throughout the course of patient's time in the ED. Low suspicion for any other emergent pathology at this time. I believe this patient is safe to be discharged. Provided strict return to ER precautions. Patient expressed agreement and understanding of plan. All questions were answered.  Differential diagnoses prior to evaluation: The emergent differential diagnosis includes, but is not limited to, tension headache, migraine, polypharmacy, substance abuse, sinusitis, cervicogenic headache, dehydration, cluster headache, trigeminal neuralgia, IIH, PRES syndrome, intracranial bleed, CVA. This is not an exhaustive differential.   Past Medical History / Co-morbidities / Social History: Malignant neoplasm of prostate HTN, hepatitis C, syphilis, asthma, neuropathy  Additional history: Chart reviewed. Pertinent results include:   Last seen by infectious disease on 12/30/2023 for hepatitis C.  Noting on 01/15/2024 that the hepatitis C treatment was not successful and that he is scheduled for a repeat appointment for 9/9.  Lab Tests/Imaging studies: I personally interpreted labs/imaging and the pertinent results include:   .  CMP at baseline for patient    Medications: I ordered medication including naproxen .  I have reviewed the patients home medicines and have made adjustments as needed.  Critical Interventions: None  Social Determinants of Health: Has good follow-up with infectious disease  Disposition: After consideration of the diagnostic results and the patients response to treatment, I feel that the patient would benefit from discharge and treatment as above.   emergency department workup does not suggest an emergent condition requiring admission or immediate intervention beyond what has been performed at this time. The plan is: Continue follow-up with infectious disease, refilled medications today, return for new or  worsening symptoms. The patient is safe for discharge and has been instructed to return immediately for worsening symptoms, change in symptoms or any other concerns.  Final diagnoses:  Acute nonintractable headache, unspecified headache type  Medication refill    ED Discharge Orders          Ordered  atorvastatin  (LIPITOR) 20 MG tablet  Daily        01/29/24 2238    lisinopril -hydrochlorothiazide  (ZESTORETIC ) 20-25 MG tablet  Daily        01/29/24 2238               Beola Terrall GORMAN DEVONNA 01/29/24 2245    Dasie Faden, MD 01/31/24 [phone]

## 2024-01-29 NOTE — ED Triage Notes (Signed)
 Pt states that he is seeing yellow spots in both eyes and has a headache that started 30 min prior to arrival. Denies other neuro sx. States he did miss one of his blood pressure medications today.

## 2024-01-29 NOTE — Discharge Instructions (Addendum)
 You were seen today for acute headache as well as medication refill.  I refilled your lisinopril  and hydrochlorothiazide  as well as your atorvastatin  per your request.  Recommend that you continue to follow-up with your PCP for further management and refills.  For headaches please continue take ibuprofen . Take Ibuprofen  400mg  every 4-6 hours for pain or fever, not exceeding 3,200 mg per day as more than 3,200mg  can cause Stomach irritation, dizziness, kidney issues with long-term use.

## 2024-01-30 ENCOUNTER — Other Ambulatory Visit (HOSPITAL_COMMUNITY): Payer: Self-pay

## 2024-01-30 ENCOUNTER — Telehealth: Payer: Self-pay

## 2024-01-30 NOTE — Telephone Encounter (Signed)
 Patient called in to say the pharmacy medications were sent to was closed and could we call them in at [facility] on [address]. Called [facility] with prescriptions as written  will leave message at [facility] pharmacy to cancel.

## 2024-01-31 ENCOUNTER — Other Ambulatory Visit (HOSPITAL_COMMUNITY): Payer: Self-pay

## 2024-02-01 ENCOUNTER — Other Ambulatory Visit (HOSPITAL_COMMUNITY): Payer: Self-pay

## 2024-02-07 ENCOUNTER — Ambulatory Visit (INDEPENDENT_AMBULATORY_CARE_PROVIDER_SITE_OTHER): Admitting: Infectious Diseases

## 2024-02-07 ENCOUNTER — Encounter: Payer: Self-pay | Admitting: Infectious Diseases

## 2024-02-07 ENCOUNTER — Other Ambulatory Visit: Payer: Self-pay

## 2024-02-07 ENCOUNTER — Other Ambulatory Visit (HOSPITAL_COMMUNITY): Payer: Self-pay

## 2024-02-07 ENCOUNTER — Telehealth: Payer: Self-pay

## 2024-02-07 VITALS — BP 118/79 | HR 93 | Temp 98.2°F | Ht 69.0 in | Wt 244.0 lb

## 2024-02-07 DIAGNOSIS — B182 Chronic viral hepatitis C: Secondary | ICD-10-CM

## 2024-02-07 MED ORDER — VOSEVI 400-100-100 MG PO TABS
1.0000 | ORAL_TABLET | Freq: Every day | ORAL | 2 refills | Status: AC
  Filled 2024-02-08: qty 28, 28d supply, fill #0
  Filled 2024-03-13: qty 28, 28d supply, fill #1
  Filled 2024-04-02 – 2024-04-05 (×3): qty 28, 28d supply, fill #2

## 2024-02-07 NOTE — Progress Notes (Signed)
 Patient Name: Shaun Morgan  Date of Birth: 06-May-1961  MRN: [id number]  PCP: Almarie Waddell NOVAK, NP  Referring Provider: Almarie Waddell NOVAK, NP, Ph#: [phone]   Subjective   Subjective:   Chief Complaint  Patient presents with   Follow-up    Medication: Epclusa  x 12 weeks   Start Date: 05/09/2023   Hepatitis C Genotype: 1a   Fibrosis Score: F0-F1   Discussed the use of AI scribe software for clinical note transcription with the patient, who gave verbal consent to proceed.  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  He is here for follow-up after a 12-week SVR check post-MAPRIT treatment for chronic hepatitis C revealed rebound viremia. He was previously treated with Epclusa , but there was a significant lapse in adherence, with an estimated 85% adherence, missing some doses and not taking the medication on time.  His pretreatment fibrosis assessment was performed, with a Medavir score of F1 and genotype 1A.  He has a history of alcohol use but has stopped drinking and smoking. He is currently attending AA meetings and is involved with the [facility] for support.  No symptoms of acid reflux, and he does not take Tums or chewable antacids.        Review of Systems  Constitutional:  Negative for malaise/fatigue and weight loss.  HENT:  Negative for nosebleeds.   Gastrointestinal:  Negative for abdominal pain, diarrhea and vomiting.  All other systems reviewed and are negative.    Past Medical History:  Diagnosis Date   Asthma    Hepatitis C    per detention nurse   Hypertension    Neuropathy    Syphilis    per detention nurse   Wears partial dentures    upper/lower    Outpatient Medications Prior to Visit  Medication Sig Dispense Refill   albuterol  (VENTOLIN  HFA) 108 (90 Base) MCG/ACT inhaler Inhale 2 puffs into the lungs  every 6 (six) hours as needed for wheezing or shortness of breath. 18 g 2   alendronate  (FOSAMAX ) 70 MG tablet Take 1 tablet (70 mg total) by mouth once a week. Take with a full glass of water on an empty stomach. 12 tablet 3   atorvastatin  (LIPITOR) 20 MG tablet Take 1 tablet (20 mg total) by mouth daily. 90 tablet 0   bisacodyl  (DULCOLAX) 10 MG suppository Place 1 suppository (10 mg total) rectally as needed for moderate constipation. 12 suppository 0   Calcium  Carb-Cholecalciferol  (CALCIUM  + VITAMIN D3) 600-10 MG-MCG TABS Take 1 tablet by mouth 2 (two) times daily with a meal. 60 tablet 2   fluticasone -salmeterol (WIXELA INHUB) 100-50 MCG/ACT AEPB Inhale 1 puff into the lungs 2 (two) times daily. 1 each 3   gabapentin  (NEURONTIN ) 300 MG capsule Take 1 capsule (300 mg total) by mouth 3 (three) times daily. 90 capsule 3   hydrocortisone  cream 1 % Apply 1 Application topically 2 (two) times daily. 30 g 0   ketoconazole  (NIZORAL ) 2 % cream Apply 1 Application topically daily. 60 g 0   lidocaine  (XYLOCAINE ) 5 % ointment Apply 1 Application topically as needed. 35.44 g 0   lisinopril -hydrochlorothiazide  (ZESTORETIC ) 20-25 MG tablet Take 1 tablet by mouth daily. 30 tablet 0   nicotine  (NICODERM CQ  - DOSED IN MG/24 HOURS) 14 mg/24hr patch Place 1 patch (14 mg total) onto the skin daily.  28 patch 0   oxyCODONE  (ROXICODONE ) 5 MG immediate release tablet Take 1 tablet (5 mg total) by mouth every 6 (six) hours as needed for severe pain (pain score 7-10). 15 tablet 0   oxyCODONE -acetaminophen  (PERCOCET/ROXICET) 5-325 MG tablet Take 1 tablet by mouth every 6 (six) hours as needed for severe pain (pain score 7-10). 10 tablet 0   polyethylene glycol (MIRALAX ) 17 g packet Take 17 g by mouth daily. 30 each 1   solifenacin  (VESICARE ) 5 MG tablet Take 1 tablet (5 mg total) by mouth at bedtime. 30 tablet 11   tamsulosin  (FLOMAX ) 0.4 MG CAPS capsule Take 2 capsules (0.8 mg total) by mouth daily after supper. 180  capsule 1   No facility-administered medications prior to visit.     Allergies  Allergen Reactions   Acetaminophen  Other (See Comments)    Hep C  Other Reaction(s): Other (See Comments), Other (See Comments)    Hep C    Social History   Tobacco Use   Smoking status: Former    Current packs/day: 0.00    Types: Cigarettes, E-cigarettes, Pipe    Quit date: 10/2021    Years since quitting: 2.2   Tobacco comments:    Patient incarcerated 10/2021, no nicotine  products allowed since incarceration per detention center nurse        05/02/23- patient vapes non nocotine   Vaping Use   Vaping status: Never Used  Substance Use Topics   Alcohol use: Not Currently    Alcohol/week: 6.0 standard drinks of alcohol    Types: 6 Standard drinks or equivalent per week   Drug use: Never    Family History  Problem Relation Age of Onset   Epilepsy Mother    Heart disease Father        Objective   Objective:   Vitals:   02/07/24 1541  BP: 118/79  Pulse: 93  Temp: 98.2 F (36.8 C)  TempSrc: Temporal  SpO2: 95%  Weight: 244 lb (110.7 kg)  Height: 5' 9 (1.753 m)   Body mass index is 36.03 kg/m.  Constitutional: in no apparent distress, well developed and well nourished, and oriented times 3 Eyes: anicteric Cardiovascular: Cor RRR and No murmurs Respiratory: clear Gastrointestinal: Bowel sounds are normal, liver is not enlarged, spleen is not enlarged Musculoskeletal: peripheral pulses normal, no pedal edema, no clubbing or cyanosis Skin: negative for - jaundice, spider hemangioma, telangiectasia, palmar erythema, ecchymosis and atrophy; no porphyria cutanea tarda Lymphatic: no cervical lymphadenopathy      Assessment & Plan:     Chronic hepatitis C with virologic relapse after prior DAA therapy (Epclusa ) - Previous treatment with Epclusa  was unsuccessful, likely due to non-adherence. No significant fibrosis or cirrhosis on pretreatment assessment. - Initiate Vosevi  for  12 weeks - Emphasize adherence to medication regimen to prevent resistance and ensure efficacy - Take Vosevi  with food for optimal absorption - Avoid antacids and calcium  supplements during treatment - Stop atorvastatin  during treatment - Schedule follow-up in one month to assess viral load - Encourage abstinence from alcohol and smoking - Engage in community support programs like AA  Alcohol dependence, in remission - Engaged in AA and community support programs. - Continue participation in [employer] and community support programs - Encourage maintaining abstinence to support overall health and treatment efficacy  Osteoporosis - Managed with alendronate . - Continue alendronate  - Stop calcium  supplements during hepatitis C treatment - Resume calcium  supplements post-treatment  Hyperlipidemia - Managed with atorvastatin , which will be temporarily discontinued  during hepatitis C treatment to avoid potential interactions with Vosevi . Minimal risk of stopping atorvastatin  temporarily due to no history of cardiovascular events. - Stop atorvastatin  during hepatitis C treatment - Resume atorvastatin  post-treatment  Recording duration: 13 minutes             No orders of the defined types were placed in this encounter.   Meds ordered this encounter  Medications   Sofosbuv-Velpatasv-Voxilaprev (VOSEVI ) 400-100-100 MG TABS    Sig: Take 1 tablet by mouth daily.    Dispense:  28 tablet    Refill:  2    Return in about 6 weeks (around 03/20/2024).  Corean Fireman, MSN, NP-C [facility]  [email or web address] Pager: [phone] Office: [phone] [facility] Main Line: [phone] *Secure Chat Communication Welcome

## 2024-02-07 NOTE — Telephone Encounter (Signed)
 Pharmacy Patient Advocate Encounter   Received notification from [employer] that prior authorization for VOSEVI  is required/requested.   Insurance verification completed.   The patient is insured through [facility] MEDICAID .   Per test claim: PA required; PA submitted to above mentioned insurance via [employer] Key/confirmation #/[facility] Status is pending

## 2024-02-07 NOTE — Patient Instructions (Addendum)
   Planning to re-treat your hepatitis C infection with a new medicine called VOSEVI  -   - One tablet once daily, with food (very important for absorption).  - Take at the same time every day to maintain consistent levels.  - Do not skip doses -- adherence is crucial for a cure.  - Do not chew or crush the tablet.  - Avoid antacids and acid-reducing medications around Vosevi :  - Antacids (e.g., Tums): Separate by 4 hours  - H2 blockers (e.g., ranitidine): Take at the same time or 12 hours apart  - PPIs (e.g., omeprazole): Avoid or take with caution under doctor supervision   Side Effects you may notice -   Most people tolerate it well, but common side effects include: Headache Fatigue Nausea Diarrhea  STOP your atorvastatin  as well (choesterol medication) while you are taking this medication. We can restart this once you are done with your treatment.

## 2024-02-08 ENCOUNTER — Other Ambulatory Visit: Payer: Self-pay

## 2024-02-08 ENCOUNTER — Telehealth: Payer: Self-pay

## 2024-02-08 ENCOUNTER — Other Ambulatory Visit (HOSPITAL_COMMUNITY): Payer: Self-pay

## 2024-02-08 NOTE — Telephone Encounter (Signed)
 Called to inform patient his Vosevi  was approved with a $4 monthly copay. The Vosevi  will be delivered this Friday, 9/12, for him to pick up. Counseled the patient to take with the largest meal of the day. Counseled that he may experience fatigue and headache while on treatment that will likely go away after a few weeks. Counseled on the importance of daily adherence. Reminded the patient to not take atorvastatin  or calcium /vitamin D while on Vosevi .   Izetta Carl, PharmD PGY1 Pharmacy Resident [facility]  02/08/2024 4:03 PM

## 2024-02-08 NOTE — Telephone Encounter (Signed)
 Pharmacy Patient Advocate Encounter  Received notification from [employer] that Prior Authorization for VOSEVI  has been APPROVED from 02/07/24 to 05/01/24. Ran test claim, Copay is $4. This test claim was processed through [facility]- copay amounts may vary at other pharmacies due to pharmacy/plan contracts, or as the patient moves through the different stages of their insurance plan.   PA #/Case ID/Reference #: [id number]

## 2024-02-08 NOTE — Progress Notes (Signed)
 Specialty Pharmacy Initial Fill Coordination Note  Shaun Morgan is a 63 y.o. male contacted today regarding initial fill of specialty medication(s) Sofosbuv-Velpatasv-Voxilaprev (Vosevi )   Patient requested Courier to Provider Office   Delivery date: 02/10/24   Verified address: [facility] [address] [place] [address]   Medication will be filled on 02/09/24.   Patient is aware of $4 copayment.    PLEASE PLACE ON AR ACCOUNT PATIENT WILL PAY WHEN THEY COME TO PICK UP THE MEDICINE

## 2024-02-09 ENCOUNTER — Other Ambulatory Visit (HOSPITAL_COMMUNITY): Payer: Self-pay

## 2024-02-09 ENCOUNTER — Other Ambulatory Visit: Payer: Self-pay

## 2024-02-09 ENCOUNTER — Telehealth: Payer: Self-pay | Admitting: *Deleted

## 2024-02-09 DIAGNOSIS — J4530 Mild persistent asthma, uncomplicated: Secondary | ICD-10-CM

## 2024-02-09 DIAGNOSIS — E782 Mixed hyperlipidemia: Secondary | ICD-10-CM

## 2024-02-09 DIAGNOSIS — I10 Essential (primary) hypertension: Secondary | ICD-10-CM

## 2024-02-09 NOTE — Progress Notes (Signed)
 Complex Care Management Note  Care Guide Note 02/09/2024 Name: Shaun Morgan MRN: [id number] DOB: [birth year]  Shaun Morgan is a 63 y.o. year old male who sees Almarie Waddell NOVAK, NP for primary care. I reached out to Sudie Grayce Burrs by phone today to offer complex care management services.  [name] was given information about Complex Care Management services today including:   The Complex Care Management services include support from the care team which includes your Nurse Care Manager, Clinical Social Worker, or Pharmacist.  The Complex Care Management team is here to help remove barriers to the health concerns and goals most important to you. Complex Care Management services are voluntary, and the patient may decline or stop services at any time by request to their care team member.   Complex Care Management Consent Status: Patient did not agree to participate in complex care management services at this time.  Follow up plan:  None   Encounter Outcome:  Patient Refused  Harlene Satterfield  [facility]  [facility], [facility] Guide  Direct Dial: [phone]  Fax [phone]

## 2024-02-10 ENCOUNTER — Ambulatory Visit: Admitting: Urology

## 2024-02-10 ENCOUNTER — Other Ambulatory Visit: Payer: Self-pay | Admitting: Pharmacist

## 2024-02-10 ENCOUNTER — Telehealth: Payer: Self-pay

## 2024-02-10 NOTE — Progress Notes (Signed)
 Specialty Pharmacy Initiation Note   Shaun Morgan is a 64 y.o. male who will be followed by the specialty pharmacy service for RxSp Hepatitis C    Review of administration, indication, effectiveness, safety, potential side effects, storage/disposable, and missed dose instructions occurred today for patient's specialty medication(s) Sofosbuv-Velpatasv-Voxilaprev (Vosevi )     Patient/Caregiver did not have any additional questions or concerns.   Patient's therapy is appropriate to: Initiate    Goals Addressed             This Visit's Progress    Achieve virologic cure as evidenced by SVR   No change    Patient is initiating therapy. Patient will be evaluated at upcoming provider appointment to assess progress.      Comply with lab assessments   On track    Patient is initiating therapy. Patient will adhere to provider and/or lab appointments.      Minimize and address adverse drug events/drug interactions   On track    Patient is on track. Patient will be evaluated at upcoming provider appointment to assess progress.         Alan JINNY Geralds Specialty Pharmacist

## 2024-02-10 NOTE — Progress Notes (Deleted)
 Assessment: 1. Malignant neoplasm of prostate (HCC); high risk; s/p XRT, on ADT   2. BPH with obstruction/lower urinary tract symptoms   3. 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 Encounter for monitoring androgen deprivation therapy      Plan: PSA today Continue tamsulosin  0.8 mg nightly Continue solifenacin  5 mg nightly.  Eligard  6 month injection given today - this will complete 2 years of ADT Continue daily calcium  and vitamin D supplementation. Continue Fosamax  70 mg weekly Return to office in 3 months  Chief Complaint:  No chief complaint on file.   History of Present Illness:  Shaun Morgan is a 63 y.o. male who is seen for continued evaluation of high risk prostate cancer and BPH with LUTS.  He was found to have an elevated PSA of 27.7 and a abnormal prostate exam with firmness on the left.  He underwent a prostate ultrasound and biopsy on 04/02/2022 which demonstrated Gleason 4+4 = 8 adenocarcinoma in 10/12 cores.  Prostate volume measured 48.5 g.  PSMA PET scan from 05/20/2022 was negative for metastatic disease with uptake at the base of the prostate and probable bilateral seminal vesicle involvement. He elected to proceed with ADT and IMRT.  He received a 24-month Eligard  on 07/21/2022. He underwent placement of fiducial markers and SpaceOAR on 09/07/2022.  He developed urinary retention postoperatively requiring Foley catheter placement in the emergency department. His Foley was removed on 10/06/2022. He completed his radiation treatments on 12/24/2022. He was given a 6 month Eligard  on 02/09/23. He received a 40-month Eligard  on 08/11/2023  PSA data: 9/24 <0.1 12/24   <0.1 3/25 <0.1 6/25 <0.1  At his visit in 9/24, he continued with lower urinary tract symptoms including hesitancy, primarily at night, sensation of incomplete emptying, decreased stream, and nocturia every hour.  His dysuria was improving.  No gross hematuria.  He continued on tamsulosin  0.8 mg nightly. IPSS =  22 QOL = 6/6. At his visit in 12/24, he continued with lower urinary tract symptoms, primarily nocturia x 5.  He reported voiding with a good stream.  No daytime symptoms.  No dysuria or gross hematuria. IPSS = 17. PVR = 25 ml. He was tolerating androgen deprivation therapy well.  No significant hot flashes.  He reported a good appetite.  No bone pain or weight loss. He was given a trial of Solifenacin  5 mg daily at his visit in 12/24.  At his visit in March 2025, he continued on tamsulosin  0.8 mg daily.  He was not  taking the Solifenacin .  He noted improvement when he started taking the Solifenacin  with decrease in his frequency, urgency, and nocturia.  No dysuria or gross hematuria.  He was having more hot flashes.  He reported a good appetite.  He had some weight gain.  No bone pain.  Bone density test from 3/25 showed low bone mass.  He was started on Fosamax  weekly.  He continued on vitamin D and calcium  daily.  At his visit in June 2025, he continued with fatigue, hot flashes.  He reported a good appetite.  He was trying to lose weight without success.  No bone pain.  He continued with lower urinary tract symptoms including frequency and nocturia x 5.  No dysuria or gross hematuria.  He continued on tamsulosin  0.8 mg daily.  He had not started the solifenacin . IPSS = 15/6.  Portions of the above documentation were copied from a prior visit for review purposes only.  Past  Medical History:  Past Medical History:  Diagnosis Date   Asthma    Hepatitis C    per detention nurse   Hypertension    Neuropathy    Syphilis    per detention nurse   Wears partial dentures    upper/lower    Past Surgical History:  Past Surgical History:  Procedure Laterality Date   CARPAL TUNNEL RELEASE     GOLD SEED IMPLANT N/A 09/07/2022   Procedure: GOLD SEED IMPLANT;  Surgeon: Lovie Arlyss CROME, MD;  Location: [facility];  Service: Urology;  Laterality: N/A;  30   SPACE OAR INSTILLATION  N/A 09/07/2022   Procedure: SPACE OAR INSTILLATION;  Surgeon: Lovie Arlyss CROME, MD;  Location: [facility];  Service: Urology;  Laterality: N/A;    Allergies:  Allergies  Allergen Reactions   Acetaminophen  Other (See Comments)    Hep C  Other Reaction(s): Other (See Comments), Other (See Comments)    Hep C    Family History:  Family History  Problem Relation Age of Onset   Epilepsy Mother    Heart disease Father     Social History:  Social History   Tobacco Use   Smoking status: Former    Current packs/day: 0.00    Types: Cigarettes, E-cigarettes, Pipe    Quit date: 10/2021    Years since quitting: 2.2   Tobacco comments:    Patient incarcerated 10/2021, no nicotine  products allowed since incarceration per detention center nurse        05/02/23- patient vapes non nocotine   Vaping Use   Vaping status: Never Used  Substance Use Topics   Alcohol use: Not Currently    Alcohol/week: 6.0 standard drinks of alcohol    Types: 6 Standard drinks or equivalent per week   Drug use: Never    ROS: Constitutional:  Negative for fever, chills, weight loss CV: Negative for chest pain, previous MI, hypertension Respiratory:  Negative for shortness of breath, wheezing, sleep apnea, frequent cough GI:  Negative for nausea, vomiting, bloody stool, GERD  Physical exam: There were no vitals taken for this visit. GENERAL APPEARANCE:  Well appearing, well developed, well nourished, NAD HEENT:  Atraumatic, normocephalic, oropharynx clear NECK:  Supple without lymphadenopathy or thyromegaly ABDOMEN:  Soft, non-tender, no masses EXTREMITIES:  Moves all extremities well, without clubbing, cyanosis, or edema NEUROLOGIC:  Alert and oriented x 3, normal gait, CN II-XII grossly intact MENTAL STATUS:  appropriate BACK:  Non-tender to palpation, No CVAT SKIN:  Warm, dry, and intact  Results: U/A:

## 2024-02-10 NOTE — Telephone Encounter (Signed)
 [facility] Patient Advocate Encounter  Patient's medications VOSEVI  have been couriered to [facility] from [facility] and will be picked-up at [facility].  Charmaine Sharps, CPhT Specialty Pharmacy Patient [facility] Phone: [phone] Fax:  [phone]

## 2024-02-13 ENCOUNTER — Other Ambulatory Visit (HOSPITAL_COMMUNITY): Payer: Self-pay

## 2024-02-21 ENCOUNTER — Encounter: Payer: Self-pay | Admitting: Physician Assistant

## 2024-02-21 ENCOUNTER — Other Ambulatory Visit (HOSPITAL_COMMUNITY): Payer: Self-pay

## 2024-02-21 ENCOUNTER — Ambulatory Visit: Admitting: Physician Assistant

## 2024-02-21 VITALS — BP 135/90 | HR 94 | Ht 69.0 in | Wt 237.0 lb

## 2024-02-21 DIAGNOSIS — N401 Enlarged prostate with lower urinary tract symptoms: Secondary | ICD-10-CM | POA: Diagnosis not present

## 2024-02-21 DIAGNOSIS — Z87891 Personal history of nicotine dependence: Secondary | ICD-10-CM

## 2024-02-21 DIAGNOSIS — N138 Other obstructive and reflux uropathy: Secondary | ICD-10-CM

## 2024-02-21 DIAGNOSIS — Z79899 Other long term (current) drug therapy: Secondary | ICD-10-CM

## 2024-02-21 DIAGNOSIS — E782 Mixed hyperlipidemia: Secondary | ICD-10-CM

## 2024-02-21 DIAGNOSIS — C61 Malignant neoplasm of prostate: Secondary | ICD-10-CM

## 2024-02-21 MED ORDER — TAMSULOSIN HCL 0.4 MG PO CAPS
0.8000 mg | ORAL_CAPSULE | Freq: Every day | ORAL | 0 refills | Status: DC
  Filled 2024-02-21: qty 60, 30d supply, fill #0

## 2024-02-21 NOTE — Patient Instructions (Signed)
 Mediterranean Diet A Mediterranean diet is based on the traditions of countries on the [employer]. It focuses on eating more: Fruits and vegetables. Whole grains, beans, nuts, and seeds. Heart-healthy fats. These are fats that are good for your heart. It involves eating less: Dairy. Meat and eggs. Processed foods with added sugar, salt, and fat. This type of diet can help prevent certain conditions. It can also improve outcomes if you have a long-term (chronic) disease, such as kidney or heart disease. What are tips for following this plan? Reading food labels Check packaged foods for: The serving size. For foods such as rice and pasta, the serving size is the amount of cooked product, not dry. The total fat. Avoid foods with saturated fat or trans fat. Added sugars, such as corn syrup. Shopping  Try to have a balanced diet. Buy a variety of foods, such as: Fresh fruits and vegetables. You may be able to get these from local farmers markets. You can also buy them frozen. Grains, beans, nuts, and seeds. Some of these can be bought in bulk. Fresh seafood. Poultry and eggs. Low-fat dairy products. Buy whole ingredients instead of foods that have already been packaged. If you can't get fresh seafood, buy precooked frozen shrimp or canned fish, such as tuna, salmon, or sardines. Stock your pantry so you always have certain foods on hand, such as olive oil, canned tuna, canned tomatoes, rice, pasta, and beans. Cooking Cook foods with extra-virgin olive oil instead of using butter or other vegetable oils. Have meat as a side dish. Have vegetables or grains as your main dish. This means having meat in small portions or adding small amounts of meat to foods like pasta or stew. Use beans or vegetables instead of meat in common dishes like chili or lasagna. Try out different cooking methods. Try roasting, broiling, steaming, and sauting vegetables. Add frozen vegetables to soups, stews,  pasta, or rice. Add nuts or seeds for added healthy fats and plant protein at each meal. You can add these to yogurt, salads, or vegetable dishes. Marinate fish or vegetables using olive oil, lemon juice, garlic, and fresh herbs. Meal planning Plan to eat a vegetarian meal one day each week. Try to work up to two vegetarian meals, if possible. Eat seafood two or more times a week. Have healthy snacks on hand. These may include: Vegetable sticks with hummus. Greek yogurt. Fruit and nut trail mix. Eat balanced meals. These should include: Fruit: 2-3 servings a day. Vegetables: 4-5 servings a day. Low-fat dairy: 2 servings a day. Fish, poultry, or lean meat: 1 serving a day. Beans and legumes: 2 or more servings a week. Nuts and seeds: 1-2 servings a day. Whole grains: 6-8 servings a day. Extra-virgin olive oil: 3-4 servings a day. Limit red meat and sweets to just a few servings a month. Lifestyle  Try to cook and eat meals with your family. Drink enough fluid to keep your pee (urine) pale yellow. Be active every day. This includes: Aerobic exercise, which is exercise that causes your heart to beat faster. Examples include running and swimming. Leisure activities like gardening, walking, or housework. Get 7-8 hours of sleep each night. Drink red wine if your provider says you can. A glass of wine is 5 oz (150 mL). You may be allowed to have: Up to 1 glass a day if you're male and not pregnant. Up to 2 glasses a day if you're male. What foods should I eat? Fruits Apples. Apricots. Avocado.  Berries. Bananas. Cherries. Dates. Figs. Grapes. Lemons. Melon. Oranges. Peaches. Plums. Pomegranate. Vegetables Artichokes. Beets. Broccoli. Cabbage. Carrots. Eggplant. Green beans. Chard. Kale. Spinach. Onions. Leeks. Peas. Squash. Tomatoes. Peppers. Radishes. Grains Whole-grain pasta. Brown rice. Bulgur wheat. Polenta. Couscous. Whole-wheat bread. [place] Madeira. Meats and other  proteins Beans. Almonds. Sunflower seeds. Pine nuts. Peanuts. Cod. Salmon. Scallops. Shrimp. Tuna. Tilapia. Clams. Oysters. Eggs. Chicken or [place] without skin. Dairy Low-fat milk. Cheese. Greek yogurt. Fats and oils Extra-virgin olive oil. Avocado oil. Grapeseed oil. Beverages Water. Red wine. Herbal tea. Sweets and desserts Greek yogurt with honey. Baked apples. Poached pears. Trail mix. Seasonings and condiments Basil. Cilantro. Coriander. Cumin. Mint. Parsley. Sage. Rosemary. Tarragon. Garlic. Oregano. Thyme. Pepper. Balsamic vinegar. Tahini. Hummus. Tomato sauce. Olives. Mushrooms. The items listed above may not be all the foods and drinks you can have. Talk to a dietitian to learn more. What foods should I limit? This is a list of foods that should be eaten rarely. Fruits Fruit canned in syrup. Vegetables Deep-fried potatoes, like [place] fries. Grains Packaged pasta or rice dishes. Cereal with added sugar. Snacks with added sugar. Meats and other proteins Beef. Pork. Lamb. Chicken or [place] with skin. Hot dogs. [place]. Dairy Ice cream. Sour cream. Whole milk. Fats and oils Butter. Canola oil. Vegetable oil. Beef fat (tallow). Lard. Beverages Juice. Sugar-sweetened soft drinks. Beer. Liquor and spirits. Sweets and desserts Cookies. Cakes. Pies. Candy. Seasonings and condiments Mayonnaise. Pre-made sauces and marinades. The items listed above may not be all the foods and drinks you should limit. Talk to a dietitian to learn more. Where to find more information American Heart Association (AHA): heart.org This information is not intended to replace advice given to you by your health care provider. Make sure you discuss any questions you have with your health care provider. Document Revised: 08/29/2022 Document Reviewed: 08/29/2022 Elsevier Patient Education  2024 [employer].

## 2024-02-21 NOTE — Progress Notes (Unsigned)
 New Patient Office Visit  Subjective    Patient ID: Shaun Morgan, male    DOB: 1961/02/22  Age: 63 y.o. MRN: [id number]  CC:  Chief Complaint  Patient presents with   Medication Problem    Patient states he has started taking medication for Hep C, which contradicts with cholesterol medication. Patient is worried about his current cholesterol levels.  Patient discontinued use of atorvastatin  a week ago.    Discussed the use of AI scribe software for clinical note transcription with the patient, who gave verbal consent to proceed.  History of Present Illness   Shaun Morgan is a 63 year old male with high cholesterol who presents for cholesterol management.  He is currently undergoing treatment for hepatitis C, which requires discontinuation of his cholesterol medication. He has been off the cholesterol medication for approximately three months, having taken only one or two doses before starting the hepatitis C treatment. He is concerned about managing his cholesterol levels during this period. His diet is high in fried foods, including [place] fries, fried chicken, and fried chicken liver, which he consumed the night before the visit. He is taking Flomax , last discussed with urology in June, and is due for a follow-up appointment. He is currently taking Fosamax  (Ordonate) once a week on an empty stomach.     Outpatient Encounter Medications as of 02/21/2024  Medication Sig   albuterol  (VENTOLIN  HFA) 108 (90 Base) MCG/ACT inhaler Inhale 2 puffs into the lungs every 6 (six) hours as needed for wheezing or shortness of breath.   alendronate  (FOSAMAX ) 70 MG tablet Take 1 tablet (70 mg total) by mouth once a week. Take with a full glass of water on an empty stomach.   bisacodyl  (DULCOLAX) 10 MG suppository Place 1 suppository (10 mg total) rectally as needed for moderate constipation.   Calcium  Carb-Cholecalciferol  (CALCIUM  + VITAMIN D3) 600-10 MG-MCG TABS Take 1 tablet by mouth  2 (two) times daily with a meal.   fluticasone -salmeterol (WIXELA INHUB) 100-50 MCG/ACT AEPB Inhale 1 puff into the lungs 2 (two) times daily.   gabapentin  (NEURONTIN ) 300 MG capsule Take 1 capsule (300 mg total) by mouth 3 (three) times daily.   hydrocortisone  cream 1 % Apply 1 Application topically 2 (two) times daily.   ketoconazole  (NIZORAL ) 2 % cream Apply 1 Application topically daily.   lidocaine  (XYLOCAINE ) 5 % ointment Apply 1 Application topically as needed.   lisinopril -hydrochlorothiazide  (ZESTORETIC ) 20-25 MG tablet Take 1 tablet by mouth daily.   nicotine  (NICODERM CQ  - DOSED IN MG/24 HOURS) 14 mg/24hr patch Place 1 patch (14 mg total) onto the skin daily.   oxyCODONE  (ROXICODONE ) 5 MG immediate release tablet Take 1 tablet (5 mg total) by mouth every 6 (six) hours as needed for severe pain (pain score 7-10).   oxyCODONE -acetaminophen  (PERCOCET/ROXICET) 5-325 MG tablet Take 1 tablet by mouth every 6 (six) hours as needed for severe pain (pain score 7-10).   polyethylene glycol (MIRALAX ) 17 g packet Take 17 g by mouth daily.   Sofosbuv-Velpatasv-Voxilaprev (VOSEVI ) 400-100-100 MG TABS Take 1 tablet by mouth daily.   solifenacin  (VESICARE ) 5 MG tablet Take 1 tablet (5 mg total) by mouth at bedtime.   [DISCONTINUED] tamsulosin  (FLOMAX ) 0.4 MG CAPS capsule Take 2 capsules (0.8 mg total) by mouth daily after supper.   atorvastatin  (LIPITOR) 20 MG tablet Take 1 tablet (20 mg total) by mouth daily. (Patient not taking: Reported on 02/21/2024)   tamsulosin  (FLOMAX ) 0.4 MG CAPS capsule Take  2 capsules (0.8 mg total) by mouth daily after supper.   No facility-administered encounter medications on file as of 02/21/2024.    Past Medical History:  Diagnosis Date   Asthma    Hepatitis C    per detention nurse   Hypertension    Neuropathy    Syphilis    per detention nurse   Wears partial dentures    upper/lower    Past Surgical History:  Procedure Laterality Date   CARPAL TUNNEL  RELEASE     GOLD SEED IMPLANT N/A 09/07/2022   Procedure: GOLD SEED IMPLANT;  Surgeon: Lovie Arlyss CROME, MD;  Location: [facility];  Service: Urology;  Laterality: N/A;  30   SPACE OAR INSTILLATION N/A 09/07/2022   Procedure: SPACE OAR INSTILLATION;  Surgeon: Lovie Arlyss CROME, MD;  Location: [facility];  Service: Urology;  Laterality: N/A;    Family History  Problem Relation Age of Onset   Epilepsy Mother    Heart disease Father     Social History   Socioeconomic History   Marital status: Single    Spouse name: Not on file   Number of children: Not on file   Years of education: Not on file   Highest education level: Not on file  Occupational History   Not on file  Tobacco Use   Smoking status: Former    Current packs/day: 0.00    Types: Cigarettes, E-cigarettes, Pipe    Quit date: 10/2021    Years since quitting: 2.3   Smokeless tobacco: Not on file   Tobacco comments:    Patient incarcerated 10/2021, no nicotine  products allowed since incarceration per detention center nurse        05/02/23- patient vapes non nocotine   Vaping Use   Vaping status: Never Used  Substance and Sexual Activity   Alcohol use: Not Currently    Alcohol/week: 6.0 standard drinks of alcohol    Types: 6 Standard drinks or equivalent per week   Drug use: Never   Sexual activity: Not Currently  Other Topics Concern   Not on file  Social History Narrative   ** Merged History Encounter **       Are you right handed or left handed? Right Handed    Are you currently employed ? 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 Yes   Who lives with you?    What type of home do you live in: 1 story or 2 story? Lives in a one story home        Social Drivers of Health   Financial Resource Strain: Medium Risk (07/18/2018)   Received from [facility] visits prior to 07/31/2022.   Overall Financial Resource Strain (CARDIA)     Difficulty of Paying Living Expenses: Somewhat hard  Food Insecurity: Patient Declined (12/19/2023)   Hunger Vital Sign    Worried About Running Out of Food in the Last Year: Patient declined    Ran Out of Food in the Last Year: Patient declined  Transportation Needs: Patient Declined (12/19/2023)   PRAPARE - Administrator, Civil Service (Medical): Patient declined    Lack of Transportation (Non-Medical): Patient declined  Physical Activity: Not on file  Stress: Not on file  Social Connections: Not on file  Intimate Partner Violence: Patient Declined (12/19/2023)   Humiliation, Afraid, Rape, and Kick questionnaire    Fear of Current or  Ex-Partner: Patient declined    Emotionally Abused: Patient declined    Physically Abused: Patient declined    Sexually Abused: Patient declined    Review of Systems  Constitutional: Negative.   HENT: Negative.    Eyes: Negative.   Respiratory:  Negative for shortness of breath.   Cardiovascular:  Negative for chest pain.  Gastrointestinal: Negative.   Genitourinary: Negative.   Musculoskeletal: Negative.   Skin: Negative.   Neurological: Negative.   Endo/Heme/Allergies: Negative.   Psychiatric/Behavioral: Negative.          Objective    BP (!) 135/90 (BP Location: Left Arm, Patient Position: Sitting)   Pulse 94   Ht 5' 9 (1.753 m)   Wt 237 lb (107.5 kg)   BMI 35.00 kg/m   Physical Exam Vitals and nursing note reviewed.    GENERAL: Alert, cooperative, well developed, no acute distress HEENT: Normocephalic, normal oropharynx, moist mucous membranes CHEST: Clear to auscultation bilaterally, No wheezes, rhonchi, or crackles CARDIOVASCULAR: Normal heart rate and rhythm, S1 and S2 normal without murmurs EXTREMITIES: No cyanosis or edema NEUROLOGICAL: Cranial nerves grossly intact, Moves all extremities without gross motor or sensory deficit    Assessment & Plan:   Problem List Items Addressed This Visit        Genitourinary   BPH with obstruction/lower urinary tract symptoms   Relevant Medications   tamsulosin  (FLOMAX ) 0.4 MG CAPS capsule     Other   Mixed hyperlipidemia - Primary   Relevant Orders   Lipid panel (Completed)  1. Mixed hyperlipidemia (Primary) Fasting labs completed today.  Patient education given on lifestyle modifications - Lipid panel  2. BPH with obstruction/lower urinary tract symptoms Continue current regimen.  Patient encouraged to maintain follow-up with urology - tamsulosin  (FLOMAX ) 0.4 MG CAPS capsule; Take 2 capsules (0.8 mg total) by mouth daily after supper.  Dispense: 60 capsule; Refill: 0   I have reviewed the patient's medical history (PMH, PSH, Social History, Family History, Medications, and allergies) , and have been updated if relevant. I spent 30 minutes reviewing chart and  face to face time with patient.     Return if symptoms worsen or fail to improve.   Kirk RAMAN Mayers, PA-C

## 2024-02-22 ENCOUNTER — Encounter (HOSPITAL_COMMUNITY): Payer: Self-pay

## 2024-02-22 ENCOUNTER — Emergency Department (HOSPITAL_COMMUNITY)
Admission: EM | Admit: 2024-02-22 | Discharge: 2024-02-23 | Disposition: A | Discharge status: Home / Self Care | Attending: Emergency Medicine | Admitting: Emergency Medicine

## 2024-02-22 ENCOUNTER — Other Ambulatory Visit: Payer: Self-pay

## 2024-02-22 ENCOUNTER — Other Ambulatory Visit (HOSPITAL_COMMUNITY): Payer: Self-pay

## 2024-02-22 ENCOUNTER — Emergency Department (HOSPITAL_COMMUNITY)

## 2024-02-22 DIAGNOSIS — R519 Headache, unspecified: Secondary | ICD-10-CM

## 2024-02-22 DIAGNOSIS — Z7951 Long term (current) use of inhaled steroids: Secondary | ICD-10-CM | POA: Diagnosis not present

## 2024-02-22 DIAGNOSIS — B349 Viral infection, unspecified: Secondary | ICD-10-CM | POA: Diagnosis not present

## 2024-02-22 DIAGNOSIS — J45909 Unspecified asthma, uncomplicated: Secondary | ICD-10-CM | POA: Diagnosis not present

## 2024-02-22 DIAGNOSIS — I10 Essential (primary) hypertension: Secondary | ICD-10-CM | POA: Insufficient documentation

## 2024-02-22 DIAGNOSIS — D649 Anemia, unspecified: Secondary | ICD-10-CM | POA: Diagnosis not present

## 2024-02-22 DIAGNOSIS — R000 Tachycardia, unspecified: Secondary | ICD-10-CM | POA: Diagnosis not present

## 2024-02-22 DIAGNOSIS — I6782 Cerebral ischemia: Secondary | ICD-10-CM | POA: Diagnosis not present

## 2024-02-22 DIAGNOSIS — E871 Hypo-osmolality and hyponatremia: Secondary | ICD-10-CM | POA: Diagnosis not present

## 2024-02-22 DIAGNOSIS — G43909 Migraine, unspecified, not intractable, without status migrainosus: Secondary | ICD-10-CM | POA: Diagnosis not present

## 2024-02-22 DIAGNOSIS — Z79899 Other long term (current) drug therapy: Secondary | ICD-10-CM | POA: Insufficient documentation

## 2024-02-22 DIAGNOSIS — R29818 Other symptoms and signs involving the nervous system: Secondary | ICD-10-CM | POA: Diagnosis not present

## 2024-02-22 DIAGNOSIS — S0285XA Fracture of orbit, unspecified, initial encounter for closed fracture: Secondary | ICD-10-CM | POA: Diagnosis not present

## 2024-02-22 LAB — LIPID PANEL
Chol/HDL Ratio: 2.8 ratio (ref 0.0–5.0)
Cholesterol, Total: 211 mg/dL — ABNORMAL HIGH (ref 100–199)
HDL: 76 mg/dL (ref >39)
LDL Chol Calc (NIH): 120 mg/dL — ABNORMAL HIGH (ref 0–99)
Triglycerides: 83 mg/dL (ref 0–149)
VLDL Cholesterol Cal: 15 mg/dL (ref 5–40)

## 2024-02-22 LAB — RESP PANEL BY RT-PCR (RSV, FLU A&B, COVID)  RVPGX2
Influenza A by PCR: NEGATIVE
Influenza B by PCR: NEGATIVE
Resp Syncytial Virus by PCR: NEGATIVE
SARS Coronavirus 2 by RT PCR: NEGATIVE

## 2024-02-22 LAB — I-STAT CG4 LACTIC ACID, ED: Lactic Acid, Venous: 0.8 mmol/L (ref 0.5–1.9)

## 2024-02-22 MED ORDER — OXYCODONE-ACETAMINOPHEN 5-325 MG PO TABS
ORAL_TABLET | ORAL | Status: AC
  Filled 2024-02-22: qty 1

## 2024-02-22 MED ORDER — ACETAMINOPHEN 325 MG PO TABS
ORAL_TABLET | ORAL | Status: AC
  Filled 2024-02-22: qty 2

## 2024-02-22 MED ORDER — ACETAMINOPHEN 325 MG PO TABS
650.0000 mg | ORAL_TABLET | Freq: Once | ORAL | Status: AC
  Administered 2024-02-22: 650 mg via ORAL

## 2024-02-22 MED ORDER — OXYCODONE HCL 5 MG PO TABS
5.0000 mg | ORAL_TABLET | Freq: Once | ORAL | Status: AC
  Administered 2024-02-22: 5 mg via ORAL
  Filled 2024-02-22: qty 1

## 2024-02-22 NOTE — ED Provider Triage Note (Signed)
 Emergency Medicine Provider Triage Evaluation Note  Jonn Chaikin , a 63 y.o. male  was evaluated in triage.  Pt complains of headache that has been ongoing for 1 day, patient reports this started approximately 7 hours ago and has been gradually worsening.  It is posterior headache.  No blurry vision, no slurred speech, no focal weakness or deficit.  Review of Systems  Positive: HA Negative: CP  Physical Exam  BP (!) 141/83 (BP Location: Right Arm)   Pulse (!) 125   Temp 99.4 F (37.4 C)   Resp 18   Ht 5' 9 (1.753 m)   Wt 107 kg   SpO2 95%   BMI 34.84 kg/m  Gen:   Awake, no distress   Resp:  Normal effort  MSK:   Moves extremities without difficulty  Other:  Peers uncomfortable.  Moving extremities without difficulty.  Medical Decision Making  Medically screening exam initiated at 6:59 PM.  Appropriate orders placed.  Oluwatobi Visser Lagman was informed that the remainder of the evaluation will be completed by another provider, this initial triage assessment does not replace that evaluation, and the importance of remaining in the ED until their evaluation is complete.     Shermon Warren SAILOR, PA-C 02/22/24 1900

## 2024-02-22 NOTE — ED Triage Notes (Signed)
 Patient c/o migraine, reports he was taken off his calcium  and put on vosevi  and alendronate  but he was out of the alendronate  temporarily. Patient denies photosensitivity, N/V.

## 2024-02-22 NOTE — ED Triage Notes (Signed)
 Pt states he woke up with a migraine around 1230. Denies N/V/D or any other s/s at time of triage. Pt ambulated independently with cane to triage treatment room with steady gait, no signs of distress observed.

## 2024-02-23 ENCOUNTER — Ambulatory Visit: Payer: Self-pay | Admitting: Physician Assistant

## 2024-02-23 LAB — CBC WITH DIFFERENTIAL/PLATELET
Abs Immature Granulocytes: 0.03 K/uL (ref 0.00–0.07)
Basophils Absolute: 0 K/uL (ref 0.0–0.1)
Basophils Relative: 0 %
Eosinophils Absolute: 0 K/uL (ref 0.0–0.5)
Eosinophils Relative: 0 %
HCT: 36.7 % — ABNORMAL LOW (ref 39.0–52.0)
Hemoglobin: 12.2 g/dL — ABNORMAL LOW (ref 13.0–17.0)
Immature Granulocytes: 0 %
Lymphocytes Relative: 7 %
Lymphs Abs: 0.6 K/uL — ABNORMAL LOW (ref 0.7–4.0)
MCH: 33.2 pg (ref 26.0–34.0)
MCHC: 33.2 g/dL (ref 30.0–36.0)
MCV: 99.7 fL (ref 80.0–100.0)
Monocytes Absolute: 0.7 K/uL (ref 0.1–1.0)
Monocytes Relative: 8 %
Neutro Abs: 7.3 K/uL (ref 1.7–7.7)
Neutrophils Relative %: 85 %
Platelets: 143 K/uL — ABNORMAL LOW (ref 150–400)
RBC: 3.68 MIL/uL — ABNORMAL LOW (ref 4.22–5.81)
RDW: 11.8 % (ref 11.5–15.5)
WBC: 8.7 K/uL (ref 4.0–10.5)
nRBC: 0 % (ref 0.0–0.2)

## 2024-02-23 LAB — COMPREHENSIVE METABOLIC PANEL WITH GFR
ALT: 27 U/L (ref 0–44)
AST: 26 U/L (ref 15–41)
Albumin: 3.8 g/dL (ref 3.5–5.0)
Alkaline Phosphatase: 56 U/L (ref 38–126)
Anion gap: 10 (ref 5–15)
BUN: 27 mg/dL — ABNORMAL HIGH (ref 8–23)
CO2: 19 mmol/L — ABNORMAL LOW (ref 22–32)
Calcium: 8.9 mg/dL (ref 8.9–10.3)
Chloride: 103 mmol/L (ref 98–111)
Creatinine, Ser: 1.81 mg/dL — ABNORMAL HIGH (ref 0.61–1.24)
GFR, Estimated: 42 mL/min — ABNORMAL LOW (ref >60)
Glucose, Bld: 110 mg/dL — ABNORMAL HIGH (ref 70–99)
Potassium: 4.1 mmol/L (ref 3.5–5.1)
Sodium: 132 mmol/L — ABNORMAL LOW (ref 135–145)
Total Bilirubin: 1 mg/dL (ref 0.0–1.2)
Total Protein: 8.1 g/dL (ref 6.5–8.1)

## 2024-02-23 MED ORDER — PROCHLORPERAZINE MALEATE 5 MG PO TABS
5.0000 mg | ORAL_TABLET | Freq: Once | ORAL | Status: AC
  Administered 2024-02-23: 5 mg via ORAL
  Filled 2024-02-23: qty 1

## 2024-02-23 MED ORDER — KETOROLAC TROMETHAMINE 15 MG/ML IJ SOLN
15.0000 mg | Freq: Once | INTRAMUSCULAR | Status: AC
  Administered 2024-02-23: 15 mg via INTRAMUSCULAR
  Filled 2024-02-23: qty 1

## 2024-02-23 NOTE — ED Provider Notes (Addendum)
 [place] [facility] Provider Note   CSN: [id number] Arrival date & time: 02/22/24  1821     Patient presents with: Migraine   Brewer Hitchman is a 63 y.o. male past medical history significant for asthma, hypertension, and neuropathy presents today for headache that started around noon with associated body aches and fatigue.  Patient denies nausea, vomiting, diplopia, tinnitus, photophobia, phonophobia, fever, chills, or neck stiffness.    Migraine Associated symptoms include headaches.       Prior to Admission medications   Medication Sig Start Date End Date Taking? Authorizing Provider  albuterol  (VENTOLIN  HFA) 108 (90 Base) MCG/ACT inhaler Inhale 2 puffs into the lungs every 6 (six) hours as needed for wheezing or shortness of breath. 08/25/23   Almarie Waddell NOVAK, NP  alendronate  (FOSAMAX ) 70 MG tablet Take 1 tablet (70 mg total) by mouth once a week. Take with a full glass of water on an empty stomach. 11/14/23   Stoneking, Adine PARAS., MD  atorvastatin  (LIPITOR) 20 MG tablet Take 1 tablet (20 mg total) by mouth daily. Patient not taking: Reported on 02/21/2024 01/29/24 05/01/24  Bauer, Collin S, PA-C  bisacodyl  (DULCOLAX) 10 MG suppository Place 1 suppository (10 mg total) rectally as needed for moderate constipation. 06/29/23   Theadore Ozell HERO, MD  Calcium  Carb-Cholecalciferol  (CALCIUM  + VITAMIN D3) 600-10 MG-MCG TABS Take 1 tablet by mouth 2 (two) times daily with a meal. 12/27/22   Bruning, Ashlyn, PA-C  fluticasone -salmeterol (WIXELA INHUB) 100-50 MCG/ACT AEPB Inhale 1 puff into the lungs 2 (two) times daily. 04/06/23   Almarie Waddell NOVAK, NP  gabapentin  (NEURONTIN ) 300 MG capsule Take 1 capsule (300 mg total) by mouth 3 (three) times daily. 06/06/23   Almarie Waddell NOVAK, NP  hydrocortisone  cream 1 % Apply 1 Application topically 2 (two) times daily. 12/27/22   Bruning, Ashlyn, PA-C  ketoconazole  (NIZORAL ) 2 % cream Apply 1 Application topically daily.  06/03/23   Gershon Donnice SAUNDERS, DPM  lidocaine  (XYLOCAINE ) 5 % ointment Apply 1 Application topically as needed. 01/07/23   Randol Simmonds, MD  lisinopril -hydrochlorothiazide  (ZESTORETIC ) 20-25 MG tablet Take 1 tablet by mouth daily. 01/29/24   Bauer, Collin S, PA-C  nicotine  (NICODERM CQ  - DOSED IN MG/24 HOURS) 14 mg/24hr patch Place 1 patch (14 mg total) onto the skin daily. 11/29/23   Ladona Heinz, MD  oxyCODONE  (ROXICODONE ) 5 MG immediate release tablet Take 1 tablet (5 mg total) by mouth every 6 (six) hours as needed for severe pain (pain score 7-10). 07/14/23   Doretha Folks, MD  oxyCODONE -acetaminophen  (PERCOCET/ROXICET) 5-325 MG tablet Take 1 tablet by mouth every 6 (six) hours as needed for severe pain (pain score 7-10). 06/25/23   Haviland, Julie, MD  polyethylene glycol (MIRALAX ) 17 g packet Take 17 g by mouth daily. 06/29/23   Theadore Ozell HERO, MD  Sofosbuv-Velpatasv-Voxilaprev (VOSEVI ) 400-100-100 MG TABS Take 1 tablet by mouth daily. 02/07/24   Melvenia Corean SAILOR, NP  solifenacin  (VESICARE ) 5 MG tablet Take 1 tablet (5 mg total) by mouth at bedtime. 11/14/23   Stoneking, Adine PARAS., MD  tamsulosin  (FLOMAX ) 0.4 MG CAPS capsule Take 2 capsules (0.8 mg total) by mouth daily after supper. 02/21/24   Mayers, Cari S, PA-C    Allergies: Acetaminophen     Review of Systems  Neurological:  Positive for headaches.    Updated Vital Signs BP 127/85   Pulse 92   Temp 99.2 F (37.3 C) (Oral)   Resp 18  Ht 5' 9 (1.753 m)   Wt 107 kg   SpO2 94%   BMI 34.84 kg/m   Physical Exam Vitals and nursing note reviewed.  Constitutional:      General: He is not in acute distress.    Appearance: Normal appearance. He is well-developed. He is not ill-appearing.  HENT:     Head: Normocephalic and atraumatic.     Right Ear: External ear normal.     Left Ear: External ear normal.     Nose: Nose normal.     Mouth/Throat:     Mouth: Mucous membranes are moist.     Pharynx: Oropharynx is clear.  Eyes:      Extraocular Movements: Extraocular movements intact.     Conjunctiva/sclera: Conjunctivae normal.     Pupils: Pupils are equal, round, and reactive to light.  Cardiovascular:     Rate and Rhythm: Normal rate and regular rhythm.     Pulses: Normal pulses.     Heart sounds: Normal heart sounds. No murmur heard. Pulmonary:     Effort: Pulmonary effort is normal. No respiratory distress.     Breath sounds: Normal breath sounds.  Abdominal:     Palpations: Abdomen is soft.     Tenderness: There is no abdominal tenderness.  Musculoskeletal:        General: No swelling.     Cervical back: Neck supple.  Skin:    General: Skin is warm and dry.     Capillary Refill: Capillary refill takes less than 2 seconds.  Neurological:     General: No focal deficit present.     Mental Status: He is alert and oriented to person, place, and time.  Psychiatric:        Mood and Affect: Mood normal.     (all labs ordered are listed, but only abnormal results are displayed) Labs Reviewed  COMPREHENSIVE METABOLIC PANEL WITH GFR - Abnormal; Notable for the following components:      Result Value   Sodium 132 (*)    CO2 19 (*)    Glucose, Bld 110 (*)    BUN 27 (*)    Creatinine, Ser 1.81 (*)    GFR, Estimated 42 (*)    All other components within normal limits  CBC WITH DIFFERENTIAL/PLATELET - Abnormal; Notable for the following components:   RBC 3.68 (*)    Hemoglobin 12.2 (*)    HCT 36.7 (*)    Platelets 143 (*)    Lymphs Abs 0.6 (*)    All other components within normal limits  RESP PANEL BY RT-PCR (RSV, FLU A&B, COVID)  [id number]  I-STAT CG4 LACTIC ACID, ED  I-STAT CG4 LACTIC ACID, ED    EKG: None  Radiology: DG Chest 2 View Result Date: 02/22/2024 CLINICAL DATA:  Tachycardia.  Migraine. EXAM: CHEST - 2 VIEW COMPARISON:  06/06/2023 FINDINGS: Normal heart size and pulmonary vascularity. No focal airspace disease or consolidation in the lungs. No blunting of costophrenic angles. No  pneumothorax. Mediastinal contours appear intact. Degenerative changes in the spine and shoulders. IMPRESSION: No active cardiopulmonary disease. Electronically Signed   By: Elsie Gravely M.D.   On: 02/22/2024 22:45   CT Head Wo Contrast Result Date: 02/22/2024 CLINICAL DATA:  Headache, neuro deficit. EXAM: CT HEAD WITHOUT CONTRAST TECHNIQUE: Contiguous axial images were obtained from the base of the skull through the vertex without intravenous contrast. RADIATION DOSE REDUCTION: This exam was performed according to the departmental dose-optimization program which includes automated exposure control, adjustment  of the mA and/or kV according to patient size and/or use of iterative reconstruction technique. COMPARISON:  Head CT 01/11/2021 FINDINGS: Brain: There is no evidence of an acute infarct, intracranial hemorrhage, mass, midline shift, or extra-axial fluid collection. Cerebral volume is within normal limits for age. The ventricles are normal in size. Patchy cerebral white matter hypodensities are similar to the prior CT and are nonspecific but compatible with mild-to-moderate chronic small vessel ischemic disease. Vascular: Calcified atherosclerosis at the skull base. No hyperdense vessel. Skull: No acute fracture or suspicious lesion. Sinuses/Orbits: Remote medial left orbital fracture. Visualized paranasal sinuses and mastoid air cells are clear. Other: None. IMPRESSION: 1. No evidence of acute intracranial abnormality. 2. Mild-to-moderate chronic small vessel ischemic disease. Electronically Signed   By: Dasie Hamburg M.D.   On: 02/22/2024 19:55     Procedures   Medications Ordered in the ED  ketorolac  (TORADOL ) 15 MG/ML injection 15 mg (has no administration in time range)  prochlorperazine  (COMPAZINE ) tablet 5 mg (has no administration in time range)  oxyCODONE  (Oxy IR/ROXICODONE ) immediate release tablet 5 mg (5 mg Oral Given 02/22/24 1936)  acetaminophen  (TYLENOL ) tablet 650 mg (650 mg Oral  Given 02/22/24 2013)                                    Medical Decision Making Amount and/or Complexity of Data Reviewed Labs: ordered. Radiology: ordered.  Risk Prescription drug management.   This patient presents to the ED for concern of headache differential diagnosis includes brain bleed, headache, anemia, electrolyte abnormality, COVID, flu, RSV, viral URI    Additional history obtained   Additional history obtained from Electronic Medical Record External records from outside source obtained and reviewed including primary care and infectious disease notes   Lab Tests:  I Ordered, and personally interpreted labs.  The pertinent results include: Negative respiratory panel, mild anemia 12.2, lactic acid 0.8, mild hyponatremia 132, mildly elevated bun at 27 which is chronic per historical values, elevated creatinine at 1.81 slightly above patient's baseline of approximately 1.5-1.55.   Imaging Studies ordered:  I ordered imaging studies including chest x-ray I independently visualized and interpreted imaging which showed no active cardiopulmonary disease I agree with the radiologist interpretation CT head Noncon which showed no evidence of acute intracranial abnormality   Medicines ordered and prescription drug management:  I ordered medication including Toradol  and Compazine     I have reviewed the patients home medicines and have made adjustments as needed   Problem List / ED Course:  Upon reassessment patient is resting comfortably in bed. Considered for admission or further workup however patient's vital signs, physical exam, labs, imaging are reassuring.  Patient symptoms likely due to a viral illness.  Patient advised to alternate Tylenol  Motrin  as needed for pain and fever.  Patient given return precautions.  I feel patient safe for discharge at this time.      Final diagnoses:  Viral illness  Nonintractable headache, unspecified chronicity pattern,  unspecified headache type    ED Discharge Orders     None          Francis Ileana SAILOR, PA-C 02/23/24 0157    Francis Ileana SAILOR, PA-C 02/23/24 0222    Lorette Mayo, MD 02/23/24 [id number]

## 2024-02-23 NOTE — Discharge Instructions (Addendum)
 Today you were seen for a headache and bodyaches.  I suspect you likely have a viral illness.  He may alternate Tylenol  Motrin  as needed for fever and pain.  Thank you for letting us  treat you today. After reviewing your labs and imaging, I feel you are safe to go home. Please follow up with your PCP in the next several days and provide them with your records from this visit. Return to the Emergency Room if pain becomes severe or symptoms worsen.

## 2024-02-27 NOTE — Progress Notes (Signed)
 Pt attended 12/19/2023 screening event with BP of 123/83 and blood glucose was 138. At event pt did not indicate any SDOH needs.   Per chart review pt does have a PCP Basilio Mon; [facility]), insurance, and is a former smoker. Pt's last appt with PCP was 07/19/2023. Pt does not indicate any SDOH needs at this time.  No additional pt f/u to be scheduled at this time per health equity protocol.

## 2024-03-01 ENCOUNTER — Other Ambulatory Visit (HOSPITAL_COMMUNITY): Payer: Self-pay

## 2024-03-08 ENCOUNTER — Encounter: Payer: Self-pay | Admitting: Urology

## 2024-03-08 ENCOUNTER — Ambulatory Visit: Admitting: Urology

## 2024-03-08 ENCOUNTER — Other Ambulatory Visit (HOSPITAL_COMMUNITY): Payer: Self-pay

## 2024-03-08 VITALS — BP 116/76 | HR 83 | Ht 69.0 in | Wt 230.0 lb

## 2024-03-08 DIAGNOSIS — Z79818 Long term (current) use of other agents affecting estrogen receptors and estrogen levels: Secondary | ICD-10-CM | POA: Diagnosis not present

## 2024-03-08 DIAGNOSIS — C61 Malignant neoplasm of prostate: Secondary | ICD-10-CM

## 2024-03-08 DIAGNOSIS — N138 Other obstructive and reflux uropathy: Secondary | ICD-10-CM | POA: Diagnosis not present

## 2024-03-08 DIAGNOSIS — N401 Enlarged prostate with lower urinary tract symptoms: Secondary | ICD-10-CM

## 2024-03-08 DIAGNOSIS — M8580 Other specified disorders of bone density and structure, unspecified site: Secondary | ICD-10-CM

## 2024-03-08 LAB — URINALYSIS, ROUTINE W REFLEX MICROSCOPIC
Bilirubin, UA: NEGATIVE
Glucose, UA: NEGATIVE
Ketones, UA: NEGATIVE
Leukocytes,UA: NEGATIVE
Nitrite, UA: NEGATIVE
Protein,UA: NEGATIVE
Specific Gravity, UA: 1.015 (ref 1.005–1.030)
Urobilinogen, Ur: 1 mg/dL (ref 0.2–1.0)
pH, UA: 6 (ref 5.0–7.5)

## 2024-03-08 LAB — MICROSCOPIC EXAMINATION

## 2024-03-08 MED ORDER — LEUPROLIDE ACETATE (6 MONTH) 45 MG ~~LOC~~ KIT
45.0000 mg | PACK | Freq: Once | SUBCUTANEOUS | Status: AC
  Administered 2024-03-08: 45 mg via SUBCUTANEOUS

## 2024-03-08 MED ORDER — TAMSULOSIN HCL 0.4 MG PO CAPS
0.8000 mg | ORAL_CAPSULE | Freq: Every day | ORAL | 11 refills | Status: AC
  Filled 2024-03-08 – 2024-03-15 (×3): qty 60, 30d supply, fill #0
  Filled 2024-04-02 – 2024-04-07 (×2): qty 60, 30d supply, fill #1
  Filled 2024-05-14: qty 60, 30d supply, fill #2
  Filled 2024-06-15: qty 60, 30d supply, fill #3

## 2024-03-08 NOTE — Progress Notes (Signed)
 Eligard  SubQ Injection   Due to Prostate Cancer patient is present today for a Eligard  Injection.  Medication: Eligard  6 month Dose: 45 mg  Location: right  NDC: 37064-538-49 Lot: [id number] Exp: 02/27/2025  Patient tolerated well, no complications were noted  Performed by: Roselee CMA  PA approval dates:  No PA required, medicaid.

## 2024-03-08 NOTE — Progress Notes (Signed)
 Assessment: 1. Malignant neoplasm of prostate (HCC); high risk; s/p XRT, on ADT   2. Encounter for monitoring androgen deprivation therapy   3. BPH with obstruction/lower urinary tract symptoms   4. 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  I encouraged him to take this on a regular basis to control his symptoms. Eligard  6 month injection given today - this will complete 2 years of ADT Continue Fosamax  70 mg weekly Return to office in 6 months  Chief Complaint:  Chief Complaint  Patient presents with   Prostate Cancer    History of Present Illness:  Shaun Morgan is a 63 y.o. male who is seen for continued evaluation of high risk prostate cancer and BPH with LUTS.  He was found to have an elevated PSA of 27.7 and a abnormal prostate exam with firmness on the left.  He underwent a prostate ultrasound and biopsy on 04/02/2022 which demonstrated Gleason 4+4 = 8 adenocarcinoma in 10/12 cores.  Prostate volume measured 48.5 g.  PSMA PET scan from 05/20/2022 was negative for metastatic disease with uptake at the base of the prostate and probable bilateral seminal vesicle involvement. He elected to proceed with ADT and IMRT.  He received a 69-month Eligard  on 07/21/2022. He underwent placement of fiducial markers and SpaceOAR on 09/07/2022.  He developed urinary retention postoperatively requiring Foley catheter placement in the emergency department. His Foley was removed on 10/06/2022. He completed his radiation treatments on 12/24/2022. He was given a 6 month Eligard  on 02/09/23. He received a 74-month Eligard  on 08/11/2023  PSA data: 9/24 <0.1 12/24   <0.1 3/25 <0.1 6/25 <0.1  At his visit in 9/24, he continued with lower urinary tract symptoms including hesitancy, primarily at night, sensation of incomplete emptying, decreased stream, and nocturia every hour.  His dysuria was improving.  No gross hematuria.  He  continued on tamsulosin  0.8 mg nightly. IPSS = 22 QOL = 6/6. At his visit in 12/24, he continued with lower urinary tract symptoms, primarily nocturia x 5.  He reported voiding with a good stream.  No daytime symptoms.  No dysuria or gross hematuria. IPSS = 17. PVR = 25 ml. He was tolerating androgen deprivation therapy well.  No significant hot flashes.  He reported a good appetite.  No bone pain or weight loss. He was given a trial of Solifenacin  5 mg daily at his visit in 12/24.  At his visit in March 2025, he continued on tamsulosin  0.8 mg daily.  He was not  taking the Solifenacin .  He noted improvement when he started taking the Solifenacin  with decrease in his frequency, urgency, and nocturia.  No dysuria or gross hematuria.  He was having more hot flashes.  He reported a good appetite.  He had some weight gain.  No bone pain.  Bone density test from 3/25 showed low bone mass.  He was started on Fosamax  weekly.  He continued on vitamin D and calcium  daily.  At his visit in June 2025, he continued with fatigue, hot flashes.  He reported a good appetite.  He was trying to lose weight without success.  No bone pain.  He continued with lower urinary tract symptoms including frequency and nocturia x 5.  No dysuria or gross hematuria.  He continued on tamsulosin  0.8 mg daily.  He had not started the solifenacin . IPSS = 15/6.  He returns today for follow-up.  He is scheduled to receive a 15-month Eligard  today. He continues with fatigue and hot flashes.  He is also concerned about continued difficulty with weight loss.  He is not having any bone pain. He continues on tamsulosin  0.8 mg daily.  He is taking the solifenacin  intermittently.  He does notice improvement in his nighttime symptoms when he takes the solifenacin .  When he does not take the medication he does have increased nocturia.  No dysuria or gross hematuria. He was recently started attending a prostate cancer support group and feels  like this is helpful.  Portions of the above documentation were copied from a prior visit for review purposes only.  Past Medical History:  Past Medical History:  Diagnosis Date   Asthma    Hepatitis C    per detention nurse   Hypertension    Neuropathy    Syphilis    per detention nurse   Wears partial dentures    upper/lower    Past Surgical History:  Past Surgical History:  Procedure Laterality Date   CARPAL TUNNEL RELEASE     GOLD SEED IMPLANT N/A 09/07/2022   Procedure: GOLD SEED IMPLANT;  Surgeon: Lovie Arlyss CROME, MD;  Location: [facility];  Service: Urology;  Laterality: N/A;  30   SPACE OAR INSTILLATION N/A 09/07/2022   Procedure: SPACE OAR INSTILLATION;  Surgeon: Lovie Arlyss CROME, MD;  Location[facility];  Service: Urology;  Laterality: N/A;    Allergies:  Allergies  Allergen Reactions   Acetaminophen  Other (See Comments)    Hep C  Other Reaction(s): Other (See Comments), Other (See Comments)    Hep C    Family History:  Family History  Problem Relation Age of Onset   Epilepsy Mother    Heart disease Father     Social History:  Social History   Tobacco Use   Smoking status: Former    Current packs/day: 0.00    Types: Cigarettes, E-cigarettes, Pipe    Quit date: 10/2021    Years since quitting: 2.3   Tobacco comments:    Patient incarcerated 10/2021, no nicotine  products allowed since incarceration per detention center nurse        05/02/23- patient vapes non nocotine   Vaping Use   Vaping status: Never Used  Substance Use Topics   Alcohol use: Not Currently    Alcohol/week: 6.0 standard drinks of alcohol    Types: 6 Standard drinks or equivalent per week   Drug use: Never    ROS: Constitutional:  Negative for fever, chills, weight loss CV: Negative for chest pain, previous MI, hypertension Respiratory:  Negative for shortness of breath, wheezing, sleep apnea, frequent cough GI:  Negative for nausea,  vomiting, bloody stool, GERD  Physical exam: BP 116/76   Pulse 83   Ht 5' 9 (1.753 m)   Wt 230 lb (104.3 kg)   BMI 33.97 kg/m  GENERAL APPEARANCE:  Well appearing, well developed, well nourished, NAD HEENT:  Atraumatic, normocephalic, oropharynx clear NECK:  Supple without lymphadenopathy or thyromegaly ABDOMEN:  Soft, non-tender, no masses EXTREMITIES:  Moves all extremities well, without clubbing, cyanosis, or edema NEUROLOGIC:  Alert and oriented x 3, normal gait, CN II-XII grossly intact MENTAL STATUS:  appropriate BACK:  Non-tender to palpation, No CVAT SKIN:  Warm, dry, and intact  Results: U/A: 0-5 WBCs, 0-2 RBCs

## 2024-03-09 ENCOUNTER — Ambulatory Visit: Payer: Self-pay | Admitting: Urology

## 2024-03-09 LAB — PSA: Prostate Specific Ag, Serum: <0.1 ng/mL (ref 0.0–4.0)

## 2024-03-13 ENCOUNTER — Other Ambulatory Visit: Payer: Self-pay

## 2024-03-13 ENCOUNTER — Other Ambulatory Visit (HOSPITAL_COMMUNITY): Payer: Self-pay

## 2024-03-13 NOTE — Progress Notes (Signed)
 Specialty Pharmacy Refill Coordination Note  Shaun Morgan is a 63 y.o. male contacted today regarding refills of specialty medication(s) Sofosbuv-Velpatasv-Voxilaprev (Vosevi )   Patient requested [name] at [facility] [place] date: 03/14/24   Medication will be filled on 03/14/24.

## 2024-03-14 ENCOUNTER — Other Ambulatory Visit (HOSPITAL_COMMUNITY): Payer: Self-pay

## 2024-03-14 ENCOUNTER — Other Ambulatory Visit: Payer: Self-pay

## 2024-03-15 ENCOUNTER — Other Ambulatory Visit (HOSPITAL_COMMUNITY): Payer: Self-pay

## 2024-03-15 ENCOUNTER — Other Ambulatory Visit: Payer: Self-pay

## 2024-03-19 ENCOUNTER — Other Ambulatory Visit: Payer: Self-pay

## 2024-03-20 ENCOUNTER — Other Ambulatory Visit: Payer: Self-pay

## 2024-03-20 ENCOUNTER — Ambulatory Visit: Admitting: Infectious Diseases

## 2024-03-20 ENCOUNTER — Encounter: Payer: Self-pay | Admitting: Infectious Diseases

## 2024-03-20 VITALS — BP 115/74 | HR 106 | Temp 99.0°F | Resp 16 | Wt 237.8 lb

## 2024-03-20 DIAGNOSIS — K5900 Constipation, unspecified: Secondary | ICD-10-CM | POA: Diagnosis not present

## 2024-03-20 DIAGNOSIS — B182 Chronic viral hepatitis C: Secondary | ICD-10-CM

## 2024-03-20 NOTE — Progress Notes (Signed)
 Patient Name: Shaun Morgan  Date of Birth: 1960/08/01  MRN: [id number]  PCP: Shaun Waddell NOVAK, NP  Referring Provider: Almarie Waddell NOVAK, NP, Ph#: [phone]   Subjective   Subjective:   Chief Complaint  Patient presents with   Follow-up    Hep C     Hepatitis C Genotype: 1a   Fibrosis Score: F0-F1  Medication: Epclusa  x 12 weeks (failed in 2024 d/t adherence lapses)   Vosevi  started 02/13/2024   Discussed the use of AI scribe software for clinical note transcription with the patient, who gave verbal consent to proceed.  Shaun Morgan is a 63 year old male with hepatitis C who presents for follow-up after starting Vosevi .  He recently missed three days of Vosevi  due to pharmacy issues but has resumed taking it. He is currently on his second bottle and has one more bottle left to complete the treatment.  He experiences hot flashes, which he attributes to his prostate cancer treatment with Eligard , a testosterone blocker. The hot flashes last about ten minutes before resolving. He participates in a support group at [facility], which he finds beneficial.  He has constipation, which he associates with dietary changes and recent travel. He has been alternating between vegan, vegetarian, and carnivore diets and recently consumed fried foods. He plans to use Miralax  and [place] to alleviate the constipation.  He has a family history of prostate cancer and is proactive in sharing his medical condition with family to raise awareness and encourage early detection.   Review of Systems  Constitutional:  Negative for malaise/fatigue and weight loss.  HENT:  Negative for nosebleeds.   Gastrointestinal:  Negative for abdominal pain, diarrhea and vomiting.  All other systems reviewed and are negative.    Past Medical History:  Diagnosis Date   Asthma    Hepatitis C    per detention nurse   Hypertension    Neuropathy    Syphilis    per detention  nurse   Wears partial dentures    upper/lower    Outpatient Medications Prior to Visit  Medication Sig Dispense Refill   albuterol  (VENTOLIN  HFA) 108 (90 Base) MCG/ACT inhaler Inhale 2 puffs into the lungs every 6 (six) hours as needed for wheezing or shortness of breath. 18 g 2   alendronate  (FOSAMAX ) 70 MG tablet Take 1 tablet (70 mg total) by mouth once a week. Take with a full glass of water on an empty stomach. 12 tablet 3   atorvastatin  (LIPITOR) 20 MG tablet Take 1 tablet (20 mg total) by mouth daily. 90 tablet 0   bisacodyl  (DULCOLAX) 10 MG suppository Place 1 suppository (10 mg total) rectally as needed for moderate constipation. 12 suppository 0   Calcium  Carb-Cholecalciferol  (CALCIUM  + VITAMIN D3) 600-10 MG-MCG TABS Take 1 tablet by mouth 2 (two) times daily with a meal. 60 tablet 2   fluticasone -salmeterol (WIXELA INHUB) 100-50 MCG/ACT AEPB Inhale 1 puff into the lungs 2 (two) times daily. 1 each 3   gabapentin  (NEURONTIN ) 300 MG capsule Take 1 capsule (300 mg total) by mouth 3 (three) times daily. 90 capsule 3   hydrocortisone  cream 1 % Apply 1 Application topically 2 (two) times daily. 30 g 0   ketoconazole  (NIZORAL ) 2 % cream Apply 1 Application topically daily. 60 g 0   lidocaine  (XYLOCAINE ) 5 % ointment Apply 1 Application topically as needed. 35.44 g 0   lisinopril -hydrochlorothiazide  (ZESTORETIC ) 20-25 MG tablet Take 1 tablet by mouth daily.  30 tablet 0   nicotine  (NICODERM CQ  - DOSED IN MG/24 HOURS) 14 mg/24hr patch Place 1 patch (14 mg total) onto the skin daily. 28 patch 0   oxyCODONE  (ROXICODONE ) 5 MG immediate release tablet Take 1 tablet (5 mg total) by mouth every 6 (six) hours as needed for severe pain (pain score 7-10). 15 tablet 0   oxyCODONE -acetaminophen  (PERCOCET/ROXICET) 5-325 MG tablet Take 1 tablet by mouth every 6 (six) hours as needed for severe pain (pain score 7-10). 10 tablet 0   polyethylene glycol (MIRALAX ) 17 g packet Take 17 g by mouth daily. 30 each 1    Sofosbuv-Velpatasv-Voxilaprev (VOSEVI ) 400-100-100 MG TABS Take 1 tablet by mouth daily. 28 tablet 2   solifenacin  (VESICARE ) 5 MG tablet Take 1 tablet (5 mg total) by mouth at bedtime. 30 tablet 11   tamsulosin  (FLOMAX ) 0.4 MG CAPS capsule Take 2 capsules (0.8 mg total) by mouth daily after supper. 60 capsule 11   No facility-administered medications prior to visit.     Allergies  Allergen Reactions   Acetaminophen  Other (See Comments)    Hep C  Other Reaction(s): Other (See Comments), Other (See Comments)    Hep C    Social History   Tobacco Use   Smoking status: Former    Current packs/day: 0.00    Types: Cigarettes, E-cigarettes, Pipe    Quit date: 10/2021    Years since quitting: 2.3   Tobacco comments:    Patient incarcerated 10/2021, no nicotine  products allowed since incarceration per detention center nurse        05/02/23- patient vapes non nocotine   Vaping Use   Vaping status: Never Used  Substance Use Topics   Alcohol use: Not Currently    Alcohol/week: 6.0 standard drinks of alcohol    Types: 6 Standard drinks or equivalent per week   Drug use: Never    Family History  Problem Relation Age of Onset   Epilepsy Mother    Heart disease Father        Objective   Objective:   Vitals:   03/20/24 1424 03/20/24 1425  BP:  115/74  Pulse:  (!) 106  Resp: 16 16  Temp:  99 F (37.2 C)  TempSrc:  Oral  SpO2:  96%  Weight: 237 lb 12.8 oz (107.9 kg)    Body mass index is 35.12 kg/m.  Constitutional: in no apparent distress, well developed and well nourished, and oriented times 3 Eyes: anicteric Cardiovascular: Cor RRR and No murmurs Respiratory: clear Gastrointestinal: Bowel sounds are normal, liver is not enlarged, spleen is not enlarged Musculoskeletal: peripheral pulses normal, no pedal edema, no clubbing or cyanosis Skin: negative for - jaundice, spider hemangioma, telangiectasia, palmar erythema, ecchymosis and atrophy; no porphyria cutanea  tarda Lymphatic: no cervical lymphadenopathy      Assessment & Plan:   Chronic hepatitis C infection - Chronic hepatitis C infection on Vosevi  therapy. Missed three days of medication due to pharmacy issues but has resumed therapy. Tolerating medication well otherwise and into 2nd bottle now. One more to refill. - Check viral load to ensure treatment efficacy - Message specialty pharmacy team to ensure timely medication refills - Advise him to contact pharmacy when one week of medication remains  Constipation Intermittent constipation possibly related to dietary changes, including recent consumption of fried food. - Use Miralax  and Dulcolax as needed for constipation relief       Orders Placed This Encounter  Procedures   Hepatitis C RNA quantitative (  [facility])    No orders of the defined types were placed in this encounter.   Return in about 6 weeks (around 05/01/2024).  Corean Fireman, MSN, NP-C [facility]  [email or web address] Pager: [phone] Office: [phone] [facility] Main Line: [phone] *Secure Chat Communication Welcome

## 2024-03-20 NOTE — Patient Instructions (Addendum)
   Call when you have 1 week left on the vosevi  to get your last botle so you don't risk missing anymore from the pharmacy's slowness. SABRA

## 2024-03-23 LAB — HEPATITIS C RNA QUANTITATIVE
HCV Quantitative Log: <1.18 {Log_IU}/mL — AB
HCV RNA, PCR, QN: <15 [IU]/mL — AB

## 2024-03-26 ENCOUNTER — Ambulatory Visit: Payer: Self-pay | Admitting: Infectious Diseases

## 2024-04-02 ENCOUNTER — Encounter: Payer: Self-pay | Admitting: Radiology

## 2024-04-02 ENCOUNTER — Other Ambulatory Visit (HOSPITAL_COMMUNITY): Payer: Self-pay

## 2024-04-02 ENCOUNTER — Other Ambulatory Visit: Payer: Self-pay

## 2024-04-03 ENCOUNTER — Other Ambulatory Visit (HOSPITAL_COMMUNITY): Payer: Self-pay

## 2024-04-05 ENCOUNTER — Other Ambulatory Visit (HOSPITAL_COMMUNITY): Payer: Self-pay

## 2024-04-06 ENCOUNTER — Other Ambulatory Visit (HOSPITAL_COMMUNITY): Payer: Self-pay

## 2024-04-06 ENCOUNTER — Other Ambulatory Visit: Payer: Self-pay

## 2024-04-06 NOTE — Progress Notes (Signed)
 Specialty Pharmacy Refill Coordination Note  Shaun Morgan is a 63 y.o. male contacted today regarding refills of specialty medication(s) Sofosbuv-Velpatasv-Voxilaprev (Vosevi )   Patient requested [name] at [facility] [place] date: 04/10/24   Medication will be filled on: 04/09/24

## 2024-04-09 ENCOUNTER — Other Ambulatory Visit: Payer: Self-pay

## 2024-04-10 ENCOUNTER — Other Ambulatory Visit: Payer: Self-pay | Admitting: Family Medicine

## 2024-04-10 ENCOUNTER — Other Ambulatory Visit: Payer: Self-pay

## 2024-04-10 ENCOUNTER — Other Ambulatory Visit (HOSPITAL_COMMUNITY): Payer: Self-pay

## 2024-04-11 ENCOUNTER — Encounter (INDEPENDENT_AMBULATORY_CARE_PROVIDER_SITE_OTHER): Payer: Self-pay

## 2024-04-23 ENCOUNTER — Other Ambulatory Visit: Payer: Self-pay | Admitting: Family Medicine

## 2024-04-23 ENCOUNTER — Encounter (HOSPITAL_COMMUNITY): Payer: Self-pay | Admitting: *Deleted

## 2024-04-23 ENCOUNTER — Other Ambulatory Visit: Payer: Self-pay

## 2024-04-23 ENCOUNTER — Emergency Department (EMERGENCY_DEPARTMENT_HOSPITAL)

## 2024-04-23 ENCOUNTER — Other Ambulatory Visit (HOSPITAL_COMMUNITY): Payer: Self-pay

## 2024-04-23 ENCOUNTER — Emergency Department (HOSPITAL_COMMUNITY)
Admission: EM | Admit: 2024-04-23 | Discharge: 2024-04-23 | Disposition: A | Discharge status: Home / Self Care | Attending: Emergency Medicine | Admitting: Emergency Medicine

## 2024-04-23 ENCOUNTER — Emergency Department (HOSPITAL_COMMUNITY)

## 2024-04-23 DIAGNOSIS — M79661 Pain in right lower leg: Secondary | ICD-10-CM | POA: Diagnosis not present

## 2024-04-23 DIAGNOSIS — Z8546 Personal history of malignant neoplasm of prostate: Secondary | ICD-10-CM | POA: Insufficient documentation

## 2024-04-23 DIAGNOSIS — M7730 Calcaneal spur, unspecified foot: Secondary | ICD-10-CM | POA: Diagnosis not present

## 2024-04-23 DIAGNOSIS — Z79899 Other long term (current) drug therapy: Secondary | ICD-10-CM | POA: Insufficient documentation

## 2024-04-23 DIAGNOSIS — M79604 Pain in right leg: Secondary | ICD-10-CM | POA: Diagnosis present

## 2024-04-23 DIAGNOSIS — M25579 Pain in unspecified ankle and joints of unspecified foot: Secondary | ICD-10-CM | POA: Diagnosis not present

## 2024-04-23 DIAGNOSIS — M779 Enthesopathy, unspecified: Secondary | ICD-10-CM | POA: Diagnosis not present

## 2024-04-23 DIAGNOSIS — R52 Pain, unspecified: Secondary | ICD-10-CM | POA: Diagnosis not present

## 2024-04-23 DIAGNOSIS — I739 Peripheral vascular disease, unspecified: Secondary | ICD-10-CM | POA: Diagnosis not present

## 2024-04-23 DIAGNOSIS — I10 Essential (primary) hypertension: Secondary | ICD-10-CM | POA: Insufficient documentation

## 2024-04-23 NOTE — Progress Notes (Signed)
 RLE venous duplex has been completed.  Preliminary results  given to Fonda Siva, PA-C.   Results can be found under chart review under CV PROC. 04/23/2024 10:36 AM Luka Stohr RVT, RDMS

## 2024-04-23 NOTE — ED Triage Notes (Signed)
 Patient c/o burning sensation right leg from his knee down onset 2 days ago , states no change today just not going away

## 2024-04-23 NOTE — ED Provider Notes (Signed)
 [place] EMERGENCY DEPARTMENT AT [facility] Provider Note   CSN: [id number] Arrival date & time: 04/23/24  9378     Patient presents with: No chief complaint on file.   Shaun Morgan is a 63 y.o. male.  63 year old male presents to the ED with complaints of right leg heaviness x 1 week.  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  Patient reports from his rt knee down he feels like his leg is very heavy and has had some pain.  Patient denies any falls or injuries to the area.  Patient reports he last received radiation treatment 2 years ago and took a testosterone blocker approximately 1 month ago.  He is seen by urology every 6 months for Eligard  injection and he is also prescribed tamsulosin  and solifenacin  for BPH and urinary issues.  Patient denies any recent travel, sickness, surgery, or hormone therapy.  Patient reports he has not had any difficulties ambulating.  Patient advised that a side effect of the Eligard  injection is shortness of breath so he reports having chronic shortness of breath.  He does endorse 40 years of smoking which he recently quit about 2 months ago.  He also has neuropathy treated with gabapentin  by PCP.  Patient reports he does not know where his neuropathy stems from.     Prior to Admission medications   Medication Sig Start Date End Date Taking? Authorizing Provider  albuterol  (VENTOLIN  HFA) 108 (90 Base) MCG/ACT inhaler Inhale 2 puffs into the lungs every 6 (six) hours as needed for wheezing or shortness of breath. 08/25/23   Almarie Waddell NOVAK, NP  alendronate  (FOSAMAX ) 70 MG tablet Take 1 tablet (70 mg total) by mouth once a week. Take with a full glass of water on an empty stomach. 11/14/23   Stoneking, Adine PARAS., MD  atorvastatin  (LIPITOR) 20 MG tablet Take 1 tablet (20 mg total) by mouth daily. 01/29/24 05/01/24  Bauer, Collin S, PA-C   bisacodyl  (DULCOLAX) 10 MG suppository Place 1 suppository (10 mg total) rectally as needed for moderate constipation. 06/29/23   Theadore Ozell HERO, MD  Calcium  Carb-Cholecalciferol  (CALCIUM  + VITAMIN D3) 600-10 MG-MCG TABS Take 1 tablet by mouth 2 (two) times daily with a meal. 12/27/22   Bruning, Ashlyn, PA-C  fluticasone -salmeterol (WIXELA INHUB) 100-50 MCG/ACT AEPB Inhale 1 puff into the lungs 2 (two) times daily. 04/06/23   Almarie Waddell NOVAK, NP  gabapentin  (NEURONTIN ) 300 MG capsule Take 1 capsule (300 mg total) by mouth 3 (three) times daily. 06/06/23   Almarie Waddell NOVAK, NP  hydrocortisone  cream 1 % Apply 1 Application topically 2 (two) times daily. 12/27/22   Bruning, Ashlyn, PA-C  ketoconazole  (NIZORAL ) 2 % cream Apply 1 Application topically daily. 06/03/23   Gershon Donnice SAUNDERS, DPM  lidocaine  (XYLOCAINE ) 5 % ointment Apply 1 Application topically as needed. 01/07/23   Randol Simmonds, MD  lisinopril -hydrochlorothiazide  (ZESTORETIC ) 20-25 MG tablet Take 1 tablet by mouth daily. 01/29/24   Bauer, Collin S, PA-C  nicotine  (NICODERM CQ  - DOSED IN MG/24 HOURS) 14 mg/24hr patch Place 1 patch (14 mg total) onto the skin daily. 11/29/23   Ladona Heinz, MD  oxyCODONE  (ROXICODONE ) 5 MG immediate release tablet Take 1 tablet (5 mg total) by mouth every 6 (six) hours as needed for severe pain (pain score 7-10). 07/14/23   Doretha Folks, MD  oxyCODONE -acetaminophen  (PERCOCET/ROXICET) 5-325 MG tablet Take 1 tablet by mouth every  6 (six) hours as needed for severe pain (pain score 7-10). 06/25/23   Haviland, Julie, MD  polyethylene glycol (MIRALAX ) 17 g packet Take 17 g by mouth daily. 06/29/23   Theadore Ozell HERO, MD  Sofosbuv-Velpatasv-Voxilaprev (VOSEVI ) 400-100-100 MG TABS Take 1 tablet by mouth daily. 02/07/24   Melvenia Corean SAILOR, NP  solifenacin  (VESICARE ) 5 MG tablet Take 1 tablet (5 mg total) by mouth at bedtime. 11/14/23   Stoneking, Adine PARAS., MD  tamsulosin  (FLOMAX ) 0.4 MG CAPS capsule Take 2 capsules (0.8 mg total) by  mouth daily after supper. 03/08/24   Stoneking, Adine PARAS., MD    Allergies: Acetaminophen     Review of Systems  Musculoskeletal:  Positive for arthralgias.  All other systems reviewed and are negative.   Updated Vital Signs BP (!) 141/84 (BP Location: Right Arm)   Pulse 88   Temp 97.8 F (36.6 C) (Oral)   Resp 20   Ht 5' 9 (1.753 m)   Wt 104.3 kg   SpO2 95%   BMI 33.97 kg/m   Physical Exam Vitals and nursing note reviewed.  Constitutional:      Appearance: Normal appearance.  HENT:     Head: Normocephalic and atraumatic.     Nose: Nose normal.  Eyes:     Extraocular Movements: Extraocular movements intact.     Conjunctiva/sclera: Conjunctivae normal.     Pupils: Pupils are equal, round, and reactive to light.  Cardiovascular:     Rate and Rhythm: Normal rate.  Pulmonary:     Effort: Pulmonary effort is normal. No respiratory distress.     Breath sounds: Normal breath sounds.  Abdominal:     General: Abdomen is flat.     Tenderness: There is no abdominal tenderness. There is no guarding.  Musculoskeletal:        General: Tenderness present. No swelling or signs of injury. Normal range of motion.     Cervical back: Normal range of motion.     Right lower leg: No edema.     Left lower leg: No edema.     Comments: Patient reports no pain or discomfort in the left leg.  In right leg there is no obvious swelling or redness.  Patient has full sensation distal to the extremity with good cap refill.  Patient has full range of motion of his knee and ankle.  There is some bony tenderness around the ankle patient denies any injuries or trauma.  Pulses are present.  Skin:    General: Skin is warm.     Capillary Refill: Capillary refill takes less than 2 seconds.     Coloration: Skin is not jaundiced or pale.  Neurological:     General: No focal deficit present.     Mental Status: He is alert.  Psychiatric:        Mood and Affect: Mood normal.        Behavior: Behavior  normal.     (all labs ordered are listed, but only abnormal results are displayed) Labs Reviewed - No data to display  EKG: None  Radiology: VAS US  LOWER EXTREMITY VENOUS (DVT) (ONLY [facility] & [facility]) Result Date: 04/23/2024  Lower Venous DVT Study Patient Name:  TYRICE HEWITT  Date of Exam:   04/23/2024 Medical Rec #: [id number]             Accession #:    [id number] Date of Birth: 02-08-1961             Patient Gender:  M Patient Age:   36 years Exam Location:  [facility] Procedure:      VAS US  LOWER EXTREMITY VENOUS (DVT) Referring Phys: FONDA SIVA --------------------------------------------------------------------------------  Indications: Pain.  Performing Technologist: Ezzie Potters RVT, RDMS  Examination Guidelines: A complete evaluation includes B-mode imaging, spectral Doppler, color Doppler, and power Doppler as needed of all accessible portions of each vessel. Bilateral testing is considered an integral part of a complete examination. Limited examinations for reoccurring indications may be performed as noted. The reflux portion of the exam is performed with the patient in reverse Trendelenburg.  +---------+---------------+---------+-----------+----------+--------------+ RIGHT    CompressibilityPhasicitySpontaneityPropertiesThrombus Aging +---------+---------------+---------+-----------+----------+--------------+ CFV      Full           Yes      Yes                                 +---------+---------------+---------+-----------+----------+--------------+ SFJ      Full                                                        +---------+---------------+---------+-----------+----------+--------------+ FV Prox  Full           Yes      Yes                                 +---------+---------------+---------+-----------+----------+--------------+ FV Mid   Full           Yes      Yes                                  +---------+---------------+---------+-----------+----------+--------------+ FV DistalFull           Yes      Yes                                 +---------+---------------+---------+-----------+----------+--------------+ PFV      Full                                                        +---------+---------------+---------+-----------+----------+--------------+ POP      Full           Yes      Yes                                 +---------+---------------+---------+-----------+----------+--------------+ PTV      Full                                                        +---------+---------------+---------+-----------+----------+--------------+   +----+---------------+---------+-----------+----------+--------------+ LEFTCompressibilityPhasicitySpontaneityPropertiesThrombus Aging +----+---------------+---------+-----------+----------+--------------+ CFV Full           Yes      Yes                                 +----+---------------+---------+-----------+----------+--------------+  Summary: RIGHT: - There is no evidence of deep vein thrombosis in the lower extremity.  - No cystic structure found in the popliteal fossa.  LEFT: - No evidence of common femoral vein obstruction.   *See table(s) above for measurements and observations. Electronically signed by Lonni Gaskins MD on 04/23/2024 at 10:47:59 AM.    Final    DG Ankle Complete Right Result Date: 04/23/2024 EXAM: 3 OR MORE VIEW(S) XRAY OF THE ANKLE 04/23/2024 07:45:29 AM CLINICAL HISTORY: 63 year old male. Pain. COMPARISON: 11/08/2023. FINDINGS: BONES AND JOINTS: Small inferior calcaneal spur. No acute fracture. No joint dislocation. SOFT TISSUES: Calcified peripheral vascular disease. Otherwise soft tissues are unremarkable. IMPRESSION: 1. No acute osseous abnormality identified about the ankle. 2. Calcified peripheral vascular disease. Electronically signed by: Helayne Hurst MD 04/23/2024 07:50 AM EST RP  Workstation: [id number]     Procedures   Medications Ordered in the ED - No data to display  63 y.o. male presents to the ED with complaints of right lower leg pain x 2 weeks., The differential diagnosis includes but not limited to PAD, DVT, dependent edema, arthritis, fracture, (Ddx)  On arrival pt is nontoxic, vitals unremarkable. Exam significant for bony tenderness around the right ankle palpation.  No noticeable swelling throughout the lower leg, bilateral legs are comparable in size with no pitting edema.  Additional history obtained from chart review significant for patient being seen by urology for Eligard  injections last seen in October 2025 with undetectable PSA and advised to continue solifenacin , tamsulosin , and Eligard  injections with follow-up in 6 months.   Imaging Studies ordered:  Due to bony tenderness around the right ankle complete right ankle x-ray was ordered with no obvious bony abnormality or acute abnormality but did note calcification in the peripheral arteries.  Ultrasound of the extremity was ordered and no abnormalities were found.  It was negative for DVT or cystic structures.    ED Course:   Patient sitting comfortably in ED bed in no acute distress nontoxic-appearing.  Patient is able to ambulate without difficulty.  Due to pain to palpation of the ankle x-ray will be ordered to rule out any underlying fractures or injuries.  X-rays were negative for bony abnormalities.  Did note some peripheral calcification.  Claudication symptoms are hard to determine because patient reports shortness of breath due to Eligard  injections.  Ultrasound will be ordered to evaluate for DVT.  Ultrasound was negative for any DVT or noted cystic structures.  On reevaluation patient sitting comfortably in ED bed in no acute distress nontoxic-appearing and he is playing on his phone.  Patient still has full range of motion of upper extremity and is able to ambulate without  assistance. Patient was advised of findings from ultrasound and advised to follow-up with vascular for evaluation of possible claudication.  Patient agreed with treatment plan and is comfortable discharge at this time.  Portions of this note were generated with Scientist, clinical (histocompatibility and immunogenetics). Dictation errors may occur despite best attempts at proofreading.   Final diagnoses:  Pain of right lower extremity    ED Discharge Orders     None          Myriam Fonda RAMAN, [place] 04/23/24 1130    Elnor Jayson LABOR, DO 04/27/24 [phone]

## 2024-04-23 NOTE — Discharge Instructions (Addendum)
 Exam and imaging are reassuring today.  It is recommended follow-up with a vascular doctor for further evaluation of leg pain.  If you experience any new or worsening symptoms that are concerning please return the ED for further evaluation.  Some symptoms to monitor for would be discoloration to the foot or limb, loss of function of the limb, or loss of ability to walk.

## 2024-04-29 ENCOUNTER — Emergency Department (HOSPITAL_COMMUNITY)

## 2024-04-29 ENCOUNTER — Other Ambulatory Visit: Payer: Self-pay

## 2024-04-29 ENCOUNTER — Emergency Department (HOSPITAL_COMMUNITY)
Admission: EM | Admit: 2024-04-29 | Discharge: 2024-04-29 | Disposition: A | Discharge status: Home / Self Care | Attending: Emergency Medicine | Admitting: Emergency Medicine

## 2024-04-29 ENCOUNTER — Encounter (HOSPITAL_COMMUNITY): Payer: Self-pay | Admitting: *Deleted

## 2024-04-29 DIAGNOSIS — I10 Essential (primary) hypertension: Secondary | ICD-10-CM | POA: Diagnosis not present

## 2024-04-29 DIAGNOSIS — D649 Anemia, unspecified: Secondary | ICD-10-CM | POA: Diagnosis not present

## 2024-04-29 DIAGNOSIS — R1032 Left lower quadrant pain: Secondary | ICD-10-CM | POA: Diagnosis not present

## 2024-04-29 DIAGNOSIS — J45909 Unspecified asthma, uncomplicated: Secondary | ICD-10-CM | POA: Diagnosis not present

## 2024-04-29 DIAGNOSIS — Z79899 Other long term (current) drug therapy: Secondary | ICD-10-CM | POA: Insufficient documentation

## 2024-04-29 DIAGNOSIS — R350 Frequency of micturition: Secondary | ICD-10-CM | POA: Diagnosis not present

## 2024-04-29 DIAGNOSIS — R10A2 Flank pain, left side: Secondary | ICD-10-CM | POA: Insufficient documentation

## 2024-04-29 DIAGNOSIS — R109 Unspecified abdominal pain: Secondary | ICD-10-CM | POA: Diagnosis not present

## 2024-04-29 LAB — CBC
HCT: 37.1 % — ABNORMAL LOW (ref 39.0–52.0)
Hemoglobin: 11.8 g/dL — ABNORMAL LOW (ref 13.0–17.0)
MCH: 32.4 pg (ref 26.0–34.0)
MCHC: 31.8 g/dL (ref 30.0–36.0)
MCV: 101.9 fL — ABNORMAL HIGH (ref 80.0–100.0)
Platelets: 176 K/uL (ref 150–400)
RBC: 3.64 MIL/uL — ABNORMAL LOW (ref 4.22–5.81)
RDW: 12 % (ref 11.5–15.5)
WBC: 5.4 K/uL (ref 4.0–10.5)
nRBC: 0 % (ref 0.0–0.2)

## 2024-04-29 LAB — COMPREHENSIVE METABOLIC PANEL WITH GFR
ALT: 16 U/L (ref 0–44)
AST: 18 U/L (ref 15–41)
Albumin: 3.8 g/dL (ref 3.5–5.0)
Alkaline Phosphatase: 52 U/L (ref 38–126)
Anion gap: 13 (ref 5–15)
BUN: 20 mg/dL (ref 8–23)
CO2: 25 mmol/L (ref 22–32)
Calcium: 9.7 mg/dL (ref 8.9–10.3)
Chloride: 101 mmol/L (ref 98–111)
Creatinine, Ser: 1.49 mg/dL — ABNORMAL HIGH (ref 0.61–1.24)
GFR, Estimated: 53 mL/min — ABNORMAL LOW (ref >60)
Glucose, Bld: 105 mg/dL — ABNORMAL HIGH (ref 70–99)
Potassium: 3.8 mmol/L (ref 3.5–5.1)
Sodium: 139 mmol/L (ref 135–145)
Total Bilirubin: 0.4 mg/dL (ref 0.0–1.2)
Total Protein: 7.6 g/dL (ref 6.5–8.1)

## 2024-04-29 LAB — URINALYSIS, ROUTINE W REFLEX MICROSCOPIC
Bilirubin Urine: NEGATIVE
Glucose, UA: NEGATIVE mg/dL
Hgb urine dipstick: NEGATIVE
Ketones, ur: NEGATIVE mg/dL
Leukocytes,Ua: NEGATIVE
Nitrite: NEGATIVE
Protein, ur: NEGATIVE mg/dL
Specific Gravity, Urine: 1.015 (ref 1.005–1.030)
pH: 6 (ref 5.0–8.0)

## 2024-04-29 LAB — LIPASE, BLOOD: Lipase: 33 U/L (ref 11–51)

## 2024-04-29 LAB — D-DIMER, QUANTITATIVE: D-Dimer, Quant: <0.27 ug{FEU}/mL (ref 0.00–0.50)

## 2024-04-29 MED ORDER — SODIUM CHLORIDE 0.9 % IV BOLUS
1000.0000 mL | Freq: Once | INTRAVENOUS | Status: AC
  Administered 2024-04-29: 1000 mL via INTRAVENOUS

## 2024-04-29 MED ORDER — MORPHINE SULFATE (PF) 4 MG/ML IV SOLN
4.0000 mg | Freq: Once | INTRAVENOUS | Status: AC
  Administered 2024-04-29: 4 mg via INTRAVENOUS
  Filled 2024-04-29: qty 1

## 2024-04-29 MED ORDER — ONDANSETRON HCL 4 MG/2ML IJ SOLN
4.0000 mg | Freq: Once | INTRAMUSCULAR | Status: AC
  Administered 2024-04-29: 4 mg via INTRAVENOUS
  Filled 2024-04-29: qty 2

## 2024-04-29 NOTE — ED Triage Notes (Signed)
 Nurse First Note: Pt presents with L flank pain that started this AM. Denies fevers, chills, or urinary symptoms.

## 2024-04-29 NOTE — ED Notes (Signed)
 Patient transported to CT

## 2024-04-29 NOTE — ED Provider Notes (Signed)
 [place] [facility] AT [facility] Provider Note   CSN: [id number] Arrival date & time: 04/29/24  1918     Patient presents with: Flank Pain  HPI Shaun Morgan is a 63 y.o. male presenting for left flank pain started earlier this morning as he was waking up from sleep.  Pain is sharp and can be worse with movement.  Denies chest pain or shortness of breath.  Endorses increased urinary frequency but no other urinary symptoms.  Denies GI symptoms and fever.  Past Medical History:  Diagnosis Date   Asthma    Hepatitis C    per detention nurse   Hypertension    Neuropathy    Syphilis    per detention nurse   Wears partial dentures    upper/lower       Flank Pain       Prior to Admission medications   Medication Sig Start Date End Date Taking? Authorizing Provider  albuterol  (VENTOLIN  HFA) 108 (90 Base) MCG/ACT inhaler Inhale 2 puffs into the lungs every 6 (six) hours as needed for wheezing or shortness of breath. 08/25/23   Almarie Waddell NOVAK, NP  alendronate  (FOSAMAX ) 70 MG tablet Take 1 tablet (70 mg total) by mouth once a week. Take with a full glass of water on an empty stomach. 11/14/23   Stoneking, Adine PARAS., MD  atorvastatin  (LIPITOR) 20 MG tablet Take 1 tablet (20 mg total) by mouth daily. 01/29/24 05/01/24  Bauer, Collin S, PA-C  bisacodyl  (DULCOLAX) 10 MG suppository Place 1 suppository (10 mg total) rectally as needed for moderate constipation. 06/29/23   Theadore Ozell HERO, MD  Calcium  Carb-Cholecalciferol  (CALCIUM  + VITAMIN D3) 600-10 MG-MCG TABS Take 1 tablet by mouth 2 (two) times daily with a meal. 12/27/22   Bruning, Ashlyn, PA-C  fluticasone -salmeterol (WIXELA INHUB) 100-50 MCG/ACT AEPB Inhale 1 puff into the lungs 2 (two) times daily. 04/06/23   Almarie Waddell NOVAK, NP  gabapentin  (NEURONTIN ) 300 MG capsule Take 1 capsule (300 mg total) by mouth 3 (three) times daily. 06/06/23   Almarie Waddell NOVAK, NP  hydrocortisone  cream 1 % Apply 1 Application  topically 2 (two) times daily. 12/27/22   Bruning, Ashlyn, PA-C  ketoconazole  (NIZORAL ) 2 % cream Apply 1 Application topically daily. 06/03/23   Gershon Donnice SAUNDERS, DPM  lidocaine  (XYLOCAINE ) 5 % ointment Apply 1 Application topically as needed. 01/07/23   Randol Simmonds, MD  lisinopril -hydrochlorothiazide  (ZESTORETIC ) 20-25 MG tablet Take 1 tablet by mouth daily. 01/29/24   Bauer, Collin S, PA-C  nicotine  (NICODERM CQ  - DOSED IN MG/24 HOURS) 14 mg/24hr patch Place 1 patch (14 mg total) onto the skin daily. 11/29/23   Ladona Heinz, MD  oxyCODONE  (ROXICODONE ) 5 MG immediate release tablet Take 1 tablet (5 mg total) by mouth every 6 (six) hours as needed for severe pain (pain score 7-10). 07/14/23   Doretha Folks, MD  oxyCODONE -acetaminophen  (PERCOCET/ROXICET) 5-325 MG tablet Take 1 tablet by mouth every 6 (six) hours as needed for severe pain (pain score 7-10). 06/25/23   Haviland, Julie, MD  polyethylene glycol (MIRALAX ) 17 g packet Take 17 g by mouth daily. 06/29/23   Theadore Ozell HERO, MD  Sofosbuv-Velpatasv-Voxilaprev (VOSEVI ) 400-100-100 MG TABS Take 1 tablet by mouth daily. 02/07/24   Melvenia Corean SAILOR, NP  solifenacin  (VESICARE ) 5 MG tablet Take 1 tablet (5 mg total) by mouth at bedtime. 11/14/23   Stoneking, Adine PARAS., MD  tamsulosin  (FLOMAX ) 0.4 MG CAPS capsule Take 2 capsules (0.8 mg total)  by mouth daily after supper. 03/08/24   Stoneking, Adine PARAS., MD    Allergies: Acetaminophen     Review of Systems  Genitourinary:  Positive for flank pain.    Updated Vital Signs BP (!) 133/101 (BP Location: Right Arm)   Pulse (!) 103   Temp 97.8 F (36.6 C)   Resp 18   Ht 5' 9 (1.753 m)   Wt 104.3 kg   SpO2 95%   BMI 33.96 kg/m   Physical Exam Vitals and nursing note reviewed.  HENT:     Head: Normocephalic and atraumatic.     Mouth/Throat:     Mouth: Mucous membranes are moist.  Eyes:     General:        Right eye: No discharge.        Left eye: No discharge.     Conjunctiva/sclera:  Conjunctivae normal.  Cardiovascular:     Rate and Rhythm: Normal rate and regular rhythm.     Pulses: Normal pulses.     Heart sounds: Normal heart sounds.  Pulmonary:     Effort: Pulmonary effort is normal.     Breath sounds: Normal breath sounds.  Abdominal:     General: Abdomen is flat.     Palpations: Abdomen is soft.     Tenderness: There is no abdominal tenderness.     Comments: Pain in the left flank reproduced with rotation of the torso  Skin:    General: Skin is warm and dry.  Neurological:     General: No focal deficit present.  Psychiatric:        Mood and Affect: Mood normal.     (all labs ordered are listed, but only abnormal results are displayed) Labs Reviewed  COMPREHENSIVE METABOLIC PANEL WITH GFR - Abnormal; Notable for the following components:      Result Value   Glucose, Bld 105 (*)    Creatinine, Ser 1.49 (*)    GFR, Estimated 53 (*)    All other components within normal limits  CBC - Abnormal; Notable for the following components:   RBC 3.64 (*)    Hemoglobin 11.8 (*)    HCT 37.1 (*)    MCV 101.9 (*)    All other components within normal limits  LIPASE, BLOOD  URINALYSIS, ROUTINE W REFLEX MICROSCOPIC  D-DIMER, QUANTITATIVE    EKG: None  Radiology: CT Renal Stone Study Result Date: 04/29/2024 EXAM: CT UROGRAM 04/29/2024 08:03:47 PM TECHNIQUE: CT of the abdomen and pelvis was performed without the administration of intravenous contrast as per CT urogram protocol. Automated exposure control, iterative reconstruction, and/or weight based adjustment of the mA/kV was utilized to reduce the radiation dose to as low as reasonably achievable. COMPARISON: CT 06/11/2023 and CT 07/25/2023. CLINICAL HISTORY: Abdominal/flank pain, stone suspected. FINDINGS: LOWER CHEST: No acute abnormality. LIVER: The liver is unremarkable. GALLBLADDER AND BILE DUCTS: Gallbladder is unremarkable. No biliary ductal dilatation. SPLEEN: No acute abnormality. PANCREAS: No acute  abnormality. ADRENAL GLANDS: No acute abnormality. KIDNEYS, URETERS AND BLADDER: Scarring in the left kidney, unchanged. No stones in the kidneys or ureters. No hydronephrosis. No perinephric or periureteral stranding. Urinary bladder is unremarkable. GI AND BOWEL: Stomach demonstrates no acute abnormality. There is no bowel obstruction. Normal appendix. PERITONEUM AND RETROPERITONEUM: No ascites. No free air. VASCULATURE: Aorta is normal in caliber. Aortic atherosclerotic calcification. LYMPH NODES: No lymphadenopathy. REPRODUCTIVE ORGANS: Metallic seeds in the prostate. BONES AND SOFT TISSUES: No acute osseous abnormality. No focal soft tissue abnormality. IMPRESSION: 1. No acute  abnormality in the abdomen or pelvis. Electronically signed by: Norman Gatlin MD 04/29/2024 08:10 PM EST RP Workstation: [id number]     Procedures   Medications Ordered in the ED  sodium chloride  0.9 % bolus 1,000 mL (1,000 mLs Intravenous New Bag/Given 04/29/24 2013)  ondansetron  (ZOFRAN ) injection 4 mg (4 mg Intravenous Given 04/29/24 2008)  morphine  (PF) 4 MG/ML injection 4 mg (4 mg Intravenous Given 04/29/24 2009)                                    Medical Decision Making Amount and/or Complexity of Data Reviewed Labs: ordered. Radiology: ordered.  Risk Prescription drug management.   Initial Impression and Ddx 63 year old well-appearing male presenting for left flank pain.  Exam unremarkable but pain was reproducible with movement.  DDx includes kidney stone, PE, diverticulitis, appendicitis, other. Patient PMH that increases complexity of ED encounter:  none  Interpretation of Diagnostics - I independent reviewed and interpreted the labs as followed: Hgb 11.8, negative D-dimer  - I independently visualized the following imaging with scope of interpretation limited to determining acute life threatening conditions related to emergency care: CT renal, which revealed no acute findings  Patient  Reassessment and Ultimate Disposition/Management On reassessment pain was well-controlled.  Work appears unremarkable.  Suspect MSK etiology.  Advised supportive care and Tylenol  and to follow-up with his PCP.  Discussed return precautions.  Discharged.  Patient management required discussion with the following services or consulting groups:  None  Complexity of Problems Addressed Acute complicated illness or Injury  Additional Data Reviewed and Analyzed Further history obtained from: Past medical history and medications listed in the EMR and Prior ED visit notes  Patient Encounter Risk Assessment Consideration of hospitalization      Final diagnoses:  Left flank pain    ED Discharge Orders     None          Lang Norleen POUR, PA-C 04/29/24 2121    Armenta Canning, MD 04/29/24 2204

## 2024-04-29 NOTE — Discharge Instructions (Addendum)
 Evaluation today was reassuring.  You did not have a kidney stone.  Suspect this could be muscle pain.  Recommend applying ice 3-4 times a day for the next 4 days and he can take Tylenol  for pain.  Please follow-up with your PCP.

## 2024-05-02 ENCOUNTER — Other Ambulatory Visit (HOSPITAL_COMMUNITY): Payer: Self-pay

## 2024-05-02 ENCOUNTER — Emergency Department (HOSPITAL_COMMUNITY)

## 2024-05-02 ENCOUNTER — Encounter (HOSPITAL_COMMUNITY): Payer: Self-pay

## 2024-05-02 ENCOUNTER — Other Ambulatory Visit: Payer: Self-pay

## 2024-05-02 ENCOUNTER — Emergency Department (HOSPITAL_COMMUNITY): Admission: EM | Admit: 2024-05-02 | Discharge: 2024-05-02 | Disposition: A | Discharge status: Home / Self Care

## 2024-05-02 DIAGNOSIS — R079 Chest pain, unspecified: Secondary | ICD-10-CM | POA: Diagnosis not present

## 2024-05-02 DIAGNOSIS — R0789 Other chest pain: Secondary | ICD-10-CM | POA: Insufficient documentation

## 2024-05-02 DIAGNOSIS — Z760 Encounter for issue of repeat prescription: Secondary | ICD-10-CM | POA: Insufficient documentation

## 2024-05-02 LAB — BASIC METABOLIC PANEL WITH GFR
Anion gap: 8 (ref 5–15)
BUN: 12 mg/dL (ref 8–23)
CO2: 25 mmol/L (ref 22–32)
Calcium: 9.2 mg/dL (ref 8.9–10.3)
Chloride: 106 mmol/L (ref 98–111)
Creatinine, Ser: 1.31 mg/dL — ABNORMAL HIGH (ref 0.61–1.24)
GFR, Estimated: >60 mL/min (ref >60)
Glucose, Bld: 109 mg/dL — ABNORMAL HIGH (ref 70–99)
Potassium: 4.3 mmol/L (ref 3.5–5.1)
Sodium: 139 mmol/L (ref 135–145)

## 2024-05-02 LAB — CBC
HCT: 35.2 % — ABNORMAL LOW (ref 39.0–52.0)
Hemoglobin: 11.6 g/dL — ABNORMAL LOW (ref 13.0–17.0)
MCH: 32.9 pg (ref 26.0–34.0)
MCHC: 33 g/dL (ref 30.0–36.0)
MCV: 99.7 fL (ref 80.0–100.0)
Platelets: 159 K/uL (ref 150–400)
RBC: 3.53 MIL/uL — ABNORMAL LOW (ref 4.22–5.81)
RDW: 12.2 % (ref 11.5–15.5)
WBC: 4.6 K/uL (ref 4.0–10.5)
nRBC: 0 % (ref 0.0–0.2)

## 2024-05-02 LAB — TROPONIN I (HIGH SENSITIVITY): Troponin I (High Sensitivity): 2 ng/L (ref <18)

## 2024-05-02 MED ORDER — LISINOPRIL 20 MG PO TABS
20.0000 mg | ORAL_TABLET | Freq: Once | ORAL | Status: AC
  Administered 2024-05-02: 20 mg via ORAL
  Filled 2024-05-02: qty 1

## 2024-05-02 MED ORDER — HYDROCHLOROTHIAZIDE 25 MG PO TABS
25.0000 mg | ORAL_TABLET | Freq: Every day | ORAL | Status: DC
  Administered 2024-05-02: 25 mg via ORAL
  Filled 2024-05-02: qty 1

## 2024-05-02 MED ORDER — LISINOPRIL-HYDROCHLOROTHIAZIDE 20-25 MG PO TABS
1.0000 | ORAL_TABLET | Freq: Every day | ORAL | 0 refills | Status: DC
  Filled 2024-05-02: qty 30, 30d supply, fill #0

## 2024-05-02 NOTE — ED Provider Notes (Signed)
 [place] [facility] AT [facility] Provider Note   CSN: [id number] Arrival date & time: 05/02/24  9092     Patient presents with: Chest Pain   Shaun Morgan is a 63 y.o. male.   63 year old male presents for evaluation of chest pain.  States he has had a difficult time obtaining his blood pressure medicine and has been almost 2 months since he has had any.  He admits to some occasional chest pain as well.  Denies any shortness of breath or diaphoresis.  Denies any other symptoms or concerns at this time.   Chest Pain Associated symptoms: no abdominal pain, no back pain, no cough, no fever, no palpitations, no shortness of breath and no vomiting        Prior to Admission medications   Medication Sig Start Date End Date Taking? Authorizing Provider  lisinopril -hydrochlorothiazide  (ZESTORETIC ) 20-25 MG tablet Take 1 tablet by mouth daily. 05/02/24  Yes Aloys Hupfer L, DO  albuterol  (VENTOLIN  HFA) 108 (90 Base) MCG/ACT inhaler Inhale 2 puffs into the lungs every 6 (six) hours as needed for wheezing or shortness of breath. 08/25/23   Almarie Waddell NOVAK, NP  alendronate  (FOSAMAX ) 70 MG tablet Take 1 tablet (70 mg total) by mouth once a week. Take with a full glass of water on an empty stomach. 11/14/23   Stoneking, Adine PARAS., MD  atorvastatin  (LIPITOR) 20 MG tablet Take 1 tablet (20 mg total) by mouth daily. 01/29/24 05/01/24  Bauer, Collin S, PA-C  bisacodyl  (DULCOLAX) 10 MG suppository Place 1 suppository (10 mg total) rectally as needed for moderate constipation. 06/29/23   Theadore Ozell HERO, MD  Calcium  Carb-Cholecalciferol  (CALCIUM  + VITAMIN D3) 600-10 MG-MCG TABS Take 1 tablet by mouth 2 (two) times daily with a meal. 12/27/22   Bruning, Ashlyn, PA-C  fluticasone -salmeterol (WIXELA INHUB) 100-50 MCG/ACT AEPB Inhale 1 puff into the lungs 2 (two) times daily. 04/06/23   Almarie Waddell NOVAK, NP  gabapentin  (NEURONTIN ) 300 MG capsule Take 1 capsule (300 mg total) by mouth 3  (three) times daily. 06/06/23   Almarie Waddell NOVAK, NP  hydrocortisone  cream 1 % Apply 1 Application topically 2 (two) times daily. 12/27/22   Bruning, Ashlyn, PA-C  ketoconazole  (NIZORAL ) 2 % cream Apply 1 Application topically daily. 06/03/23   Gershon Donnice SAUNDERS, DPM  lidocaine  (XYLOCAINE ) 5 % ointment Apply 1 Application topically as needed. 01/07/23   Randol Simmonds, MD  nicotine  (NICODERM CQ  - DOSED IN MG/24 HOURS) 14 mg/24hr patch Place 1 patch (14 mg total) onto the skin daily. 11/29/23   Ladona Heinz, MD  oxyCODONE  (ROXICODONE ) 5 MG immediate release tablet Take 1 tablet (5 mg total) by mouth every 6 (six) hours as needed for severe pain (pain score 7-10). 07/14/23   Doretha Folks, MD  oxyCODONE -acetaminophen  (PERCOCET/ROXICET) 5-325 MG tablet Take 1 tablet by mouth every 6 (six) hours as needed for severe pain (pain score 7-10). 06/25/23   Haviland, Julie, MD  polyethylene glycol (MIRALAX ) 17 g packet Take 17 g by mouth daily. 06/29/23   Theadore Ozell HERO, MD  Sofosbuv-Velpatasv-Voxilaprev (VOSEVI ) 400-100-100 MG TABS Take 1 tablet by mouth daily. 02/07/24   Melvenia Corean SAILOR, NP  solifenacin  (VESICARE ) 5 MG tablet Take 1 tablet (5 mg total) by mouth at bedtime. 11/14/23   Stoneking, Adine PARAS., MD  tamsulosin  (FLOMAX ) 0.4 MG CAPS capsule Take 2 capsules (0.8 mg total) by mouth daily after supper. 03/08/24   Stoneking, Adine PARAS., MD    Allergies: Acetaminophen   Review of Systems  Constitutional:  Negative for chills and fever.  HENT:  Negative for ear pain and sore throat.   Eyes:  Negative for pain and visual disturbance.  Respiratory:  Negative for cough and shortness of breath.   Cardiovascular:  Positive for chest pain. Negative for palpitations.  Gastrointestinal:  Negative for abdominal pain and vomiting.  Genitourinary:  Negative for dysuria and hematuria.  Musculoskeletal:  Negative for arthralgias and back pain.  Skin:  Negative for color change and rash.  Neurological:  Negative for seizures  and syncope.  All other systems reviewed and are negative.   Updated Vital Signs BP (!) 141/95   Pulse 72   Temp 98 F (36.7 C) (Oral)   Resp 16   Ht 5' 9 (1.753 m)   Wt 104.3 kg   SpO2 100%   BMI 33.96 kg/m   Physical Exam Vitals and nursing note reviewed.  Constitutional:      General: He is not in acute distress.    Appearance: He is well-developed. He is not ill-appearing.  HENT:     Head: Normocephalic and atraumatic.  Eyes:     Conjunctiva/sclera: Conjunctivae normal.  Cardiovascular:     Rate and Rhythm: Normal rate and regular rhythm.     Heart sounds: No murmur heard. Pulmonary:     Effort: Pulmonary effort is normal. No respiratory distress.     Breath sounds: Normal breath sounds.  Abdominal:     Palpations: Abdomen is soft.     Tenderness: There is no abdominal tenderness.  Musculoskeletal:        General: No swelling.     Cervical back: Neck supple.  Skin:    General: Skin is warm and dry.     Capillary Refill: Capillary refill takes less than 2 seconds.  Neurological:     Mental Status: He is alert.  Psychiatric:        Mood and Affect: Mood normal.     (all labs ordered are listed, but only abnormal results are displayed) Labs Reviewed  BASIC METABOLIC PANEL WITH GFR - Abnormal; Notable for the following components:      Result Value   Glucose, Bld 109 (*)    Creatinine, Ser 1.31 (*)    All other components within normal limits  CBC - Abnormal; Notable for the following components:   RBC 3.53 (*)    Hemoglobin 11.6 (*)    HCT 35.2 (*)    All other components within normal limits  TROPONIN I (HIGH SENSITIVITY)  TROPONIN I (HIGH SENSITIVITY)    EKG: EKG Interpretation Date/Time:  Wednesday May 02 2024 09:20:39 EST Ventricular Rate:  87 PR Interval:  134 QRS Duration:  87 QT Interval:  380 QTC Calculation: 458 R Axis:   46  Text Interpretation: Sinus rhythm Compared with prior EKG from 08/10/2023 Confirmed by Gennaro Bouchard  [id number]) on 05/02/2024 9:42:12 AM  Radiology: [facility] Chest 2 View Result Date: 05/02/2024 EXAM: 2 VIEW(S) XRAY OF THE CHEST 05/02/2024 09:47:00 AM COMPARISON: 02/22/2024 CLINICAL HISTORY: chest pain FINDINGS: LUNGS AND PLEURA: No focal pulmonary opacity. No pleural effusion. No pneumothorax. HEART AND MEDIASTINUM: No acute abnormality of the cardiac and mediastinal silhouettes. BONES AND SOFT TISSUES: No acute osseous abnormality. IMPRESSION: 1. No acute cardiopulmonary pathology. Electronically signed by: Franky Crease MD 05/02/2024 10:02 AM EST RP Workstation: [id number]     Procedures   Medications Ordered in the ED  hydrochlorothiazide  (HYDRODIURIL ) tablet 25 mg (25 mg Oral Given 05/02/24 0959)  lisinopril  (ZESTRIL ) tablet 20 mg (20 mg Oral Given 05/02/24 0959)                                    Medical Decision Making Cardiac monitor interpretation: Sinus rhythm, no ectopy  Social determinants of health: Noncompliance with medication, poor follow-up  Patient here for off-and-on chest pain but is more concerned about his lisinopril /hydrochlorothiazide  prescription.  States he has not had any in 2 months.  I gave him 1 dose here and will refill this.  Lab workup and imaging unremarkable.  He is chest pain-free.  Advise close up with primary care and to continue to follow-up with them for refills of his meds.  Advised to return to the ER for any new or worsening symptoms.  He feels comfortable to plan to be discharged home.  Problems Addressed: Atypical chest pain: acute illness or injury Medication refill: acute illness or injury  Amount and/or Complexity of Data Reviewed External Data Reviewed: notes.    Details: Prior ED records reviewed and patient was seen in the ER on 11/30 for flank pain Labs: ordered. Decision-making details documented in ED Course.    Details: Ordered and reviewed by me and unremarkable Radiology: ordered and independent interpretation performed. Decision-making  details documented in ED Course.    Details: Ordered and interpreted by me independently radiology Chest x-ray: Shows no acute abnormality ECG/medicine tests: ordered and independent interpretation performed. Decision-making details documented in ED Course.    Details: Ordered and inter by me in the absence of cardiology and shows sinus rhythm, no STEMI or significant change when compared to prior  Risk OTC drugs. Prescription drug management. Diagnosis or treatment significantly limited by social determinants of health.    Final diagnoses:  Atypical chest pain  Medication refill    ED Discharge Orders          Ordered    lisinopril -hydrochlorothiazide  (ZESTORETIC ) 20-25 MG tablet  Daily        05/02/24 [address] [place] L, DO 05/02/24 1417

## 2024-05-02 NOTE — ED Notes (Signed)
 Patient transported to X-ray

## 2024-05-02 NOTE — ED Notes (Signed)
 Patient dc by RN. Patient ambulatory to lobby at time of discharge. Patient verbalizes understanding of instructions without additional questions.

## 2024-05-02 NOTE — ED Triage Notes (Signed)
 Pt. Stated, I woke up this morning with chest pain. Denies any other symptoms. I have SOB  all the time.

## 2024-05-02 NOTE — Discharge Instructions (Addendum)
 Take your medications as prescribed. Be sure to follow up with your primary care provider for future refills of your medications.

## 2024-05-05 ENCOUNTER — Encounter (HOSPITAL_COMMUNITY): Payer: Self-pay

## 2024-05-05 ENCOUNTER — Other Ambulatory Visit: Payer: Self-pay

## 2024-05-05 ENCOUNTER — Emergency Department (HOSPITAL_COMMUNITY)
Admission: EM | Admit: 2024-05-05 | Discharge: 2024-05-06 | Disposition: A | Attending: Emergency Medicine | Admitting: Emergency Medicine

## 2024-05-05 DIAGNOSIS — R1032 Left lower quadrant pain: Secondary | ICD-10-CM | POA: Diagnosis not present

## 2024-05-05 DIAGNOSIS — R10A2 Flank pain, left side: Secondary | ICD-10-CM

## 2024-05-05 LAB — CBC WITH DIFFERENTIAL/PLATELET
Abs Immature Granulocytes: 0.01 K/uL (ref 0.00–0.07)
Basophils Absolute: 0.1 K/uL (ref 0.0–0.1)
Basophils Relative: 1 %
Eosinophils Absolute: 0.1 K/uL (ref 0.0–0.5)
Eosinophils Relative: 1 %
HCT: 36 % — ABNORMAL LOW (ref 39.0–52.0)
Hemoglobin: 11.8 g/dL — ABNORMAL LOW (ref 13.0–17.0)
Immature Granulocytes: 0 %
Lymphocytes Relative: 26 %
Lymphs Abs: 1.6 K/uL (ref 0.7–4.0)
MCH: 32.2 pg (ref 26.0–34.0)
MCHC: 32.8 g/dL (ref 30.0–36.0)
MCV: 98.1 fL (ref 80.0–100.0)
Monocytes Absolute: 0.6 K/uL (ref 0.1–1.0)
Monocytes Relative: 9 %
Neutro Abs: 3.9 K/uL (ref 1.7–7.7)
Neutrophils Relative %: 63 %
Platelets: 167 K/uL (ref 150–400)
RBC: 3.67 MIL/uL — ABNORMAL LOW (ref 4.22–5.81)
RDW: 12 % (ref 11.5–15.5)
WBC: 6.2 K/uL (ref 4.0–10.5)
nRBC: 0 % (ref 0.0–0.2)

## 2024-05-05 LAB — COMPREHENSIVE METABOLIC PANEL WITH GFR
ALT: 15 U/L (ref 0–44)
AST: 21 U/L (ref 15–41)
Albumin: 3.8 g/dL (ref 3.5–5.0)
Alkaline Phosphatase: 57 U/L (ref 38–126)
Anion gap: 8 (ref 5–15)
BUN: 23 mg/dL (ref 8–23)
CO2: 27 mmol/L (ref 22–32)
Calcium: 9.4 mg/dL (ref 8.9–10.3)
Chloride: 100 mmol/L (ref 98–111)
Creatinine, Ser: 1.56 mg/dL — ABNORMAL HIGH (ref 0.61–1.24)
GFR, Estimated: 50 mL/min — ABNORMAL LOW (ref >60)
Glucose, Bld: 97 mg/dL (ref 70–99)
Potassium: 5.3 mmol/L — ABNORMAL HIGH (ref 3.5–5.1)
Sodium: 135 mmol/L (ref 135–145)
Total Bilirubin: 0.7 mg/dL (ref 0.0–1.2)
Total Protein: 7.7 g/dL (ref 6.5–8.1)

## 2024-05-05 LAB — URINALYSIS, ROUTINE W REFLEX MICROSCOPIC
Bilirubin Urine: NEGATIVE
Glucose, UA: NEGATIVE mg/dL
Hgb urine dipstick: NEGATIVE
Ketones, ur: NEGATIVE mg/dL
Leukocytes,Ua: NEGATIVE
Nitrite: NEGATIVE
Protein, ur: NEGATIVE mg/dL
Specific Gravity, Urine: 1.008 (ref 1.005–1.030)
pH: 7 (ref 5.0–8.0)

## 2024-05-05 NOTE — ED Triage Notes (Signed)
 Pt c/o L flank pain, lower abdominal pain and frequent urination x 3 days.

## 2024-05-06 ENCOUNTER — Emergency Department (HOSPITAL_COMMUNITY)

## 2024-05-06 DIAGNOSIS — R109 Unspecified abdominal pain: Secondary | ICD-10-CM | POA: Diagnosis not present

## 2024-05-06 MED ORDER — NAPROXEN 250 MG PO TABS
500.0000 mg | ORAL_TABLET | Freq: Once | ORAL | Status: DC
  Filled 2024-05-06: qty 2

## 2024-05-06 NOTE — ED Provider Notes (Signed)
  EMERGENCY DEPARTMENT AT [facility] Provider Note   CSN: [id number] Arrival date & time: 05/05/24  2152     Patient presents with: Flank Pain   Shaun Morgan is a 63 y.o. male.  {Add pertinent medical, surgical, social history, OB history to HPI:32947} 63 y/o male with hx of HTN, Asthma presents to the ED for evaluation of L flank pain. He reports pain in his L flank and LLQ x 2 days which has been constant. It can be aggravated by flexing/moving his RLE/hip. He has not taken any medications for his pain. Patient has noted that his urine has had more bubbles. Denies associated fever, dysuria, hematuria, bowel changes, vomiting, recent falls or trauma, bowel/bladder incontinence, extremity numbness. No hx of kidney stones.  The history is provided by the patient. No language interpreter was used.  Flank Pain       Prior to Admission medications   Medication Sig Start Date End Date Taking? Authorizing Provider  albuterol  (VENTOLIN  HFA) 108 (90 Base) MCG/ACT inhaler Inhale 2 puffs into the lungs every 6 (six) hours as needed for wheezing or shortness of breath. 08/25/23   Almarie Waddell NOVAK, NP  alendronate  (FOSAMAX ) 70 MG tablet Take 1 tablet (70 mg total) by mouth once a week. Take with a full glass of water on an empty stomach. 11/14/23   Stoneking, Adine PARAS., MD  atorvastatin  (LIPITOR) 20 MG tablet Take 1 tablet (20 mg total) by mouth daily. 01/29/24 05/01/24  Bauer, Collin S, PA-C  bisacodyl  (DULCOLAX) 10 MG suppository Place 1 suppository (10 mg total) rectally as needed for moderate constipation. 06/29/23   Theadore Ozell HERO, MD  Calcium  Carb-Cholecalciferol  (CALCIUM  + VITAMIN D3) 600-10 MG-MCG TABS Take 1 tablet by mouth 2 (two) times daily with a meal. 12/27/22   Bruning, Ashlyn, PA-C  fluticasone -salmeterol (WIXELA INHUB) 100-50 MCG/ACT AEPB Inhale 1 puff into the lungs 2 (two) times daily. 04/06/23   Almarie Waddell NOVAK, NP  gabapentin  (NEURONTIN ) 300 MG capsule  Take 1 capsule (300 mg total) by mouth 3 (three) times daily. 06/06/23   Almarie Waddell NOVAK, NP  hydrocortisone  cream 1 % Apply 1 Application topically 2 (two) times daily. 12/27/22   Bruning, Ashlyn, PA-C  ketoconazole  (NIZORAL ) 2 % cream Apply 1 Application topically daily. 06/03/23   Gershon Donnice SAUNDERS, DPM  lidocaine  (XYLOCAINE ) 5 % ointment Apply 1 Application topically as needed. 01/07/23   Randol Simmonds, MD  lisinopril -hydrochlorothiazide  (ZESTORETIC ) 20-25 MG tablet Take 1 tablet by mouth daily. 05/02/24   Kammerer, Megan L, DO  nicotine  (NICODERM CQ  - DOSED IN MG/24 HOURS) 14 mg/24hr patch Place 1 patch (14 mg total) onto the skin daily. 11/29/23   Ladona Heinz, MD  oxyCODONE  (ROXICODONE ) 5 MG immediate release tablet Take 1 tablet (5 mg total) by mouth every 6 (six) hours as needed for severe pain (pain score 7-10). 07/14/23   Doretha Folks, MD  oxyCODONE -acetaminophen  (PERCOCET/ROXICET) 5-325 MG tablet Take 1 tablet by mouth every 6 (six) hours as needed for severe pain (pain score 7-10). 06/25/23   Haviland, Julie, MD  polyethylene glycol (MIRALAX ) 17 g packet Take 17 g by mouth daily. 06/29/23   Theadore Ozell HERO, MD  Sofosbuv-Velpatasv-Voxilaprev (VOSEVI ) 400-100-100 MG TABS Take 1 tablet by mouth daily. 02/07/24   Melvenia Corean SAILOR, NP  solifenacin  (VESICARE ) 5 MG tablet Take 1 tablet (5 mg total) by mouth at bedtime. 11/14/23   Stoneking, Adine PARAS., MD  tamsulosin  (FLOMAX ) 0.4 MG CAPS capsule Take 2  capsules (0.8 mg total) by mouth daily after supper. 03/08/24   Stoneking, Adine PARAS., MD    Allergies: Acetaminophen     Review of Systems  Genitourinary:  Positive for flank pain.  Ten systems reviewed and are negative for acute change, except as noted in the HPI.    Updated Vital Signs BP 123/83   Pulse 81   Temp 97.9 F (36.6 C)   Resp 18   Wt 108 kg   SpO2 96%   BMI 35.15 kg/m   Physical Exam Vitals and nursing note reviewed.  Constitutional:      General: He is not in acute distress.     Appearance: He is well-developed. He is not diaphoretic.     Comments: Nontoxic appearing and in NAD  HENT:     Head: Normocephalic and atraumatic.  Eyes:     General: No scleral icterus.    Conjunctiva/sclera: Conjunctivae normal.  Cardiovascular:     Rate and Rhythm: Normal rate and regular rhythm.     Pulses: Normal pulses.  Pulmonary:     Effort: Pulmonary effort is normal. No respiratory distress.  Abdominal:     Palpations: Abdomen is soft.     Comments: Abdomen soft, obese. No focal TTP or guarding.  Musculoskeletal:        General: Normal range of motion.     Cervical back: Normal range of motion.  Skin:    General: Skin is warm and dry.     Coloration: Skin is not pale.     Findings: No erythema or rash.  Neurological:     Mental Status: He is alert and oriented to person, place, and time.     Coordination: Coordination normal.     Comments: Moving all extremities spontaneously.  Psychiatric:        Behavior: Behavior normal.     (all labs ordered are listed, but only abnormal results are displayed) Labs Reviewed  CBC WITH DIFFERENTIAL/PLATELET - Abnormal; Notable for the following components:      Result Value   RBC 3.67 (*)    Hemoglobin 11.8 (*)    HCT 36.0 (*)    All other components within normal limits  COMPREHENSIVE METABOLIC PANEL WITH GFR - Abnormal; Notable for the following components:   Potassium 5.3 (*)    Creatinine, Ser 1.56 (*)    GFR, Estimated 50 (*)    All other components within normal limits  URINALYSIS, ROUTINE W REFLEX MICROSCOPIC    EKG: None  Radiology: CT Renal Stone Study Result Date: 05/06/2024 EXAM: CT UROGRAM 05/06/2024 02:54:31 AM TECHNIQUE: CT of the abdomen and pelvis was performed without the administration of intravenous contrast as per CT urogram protocol. Multiplanar reformatted images as well as MIP urogram images are provided for review. Automated exposure control, iterative reconstruction, and/or weight based  adjustment of the mA/kV was utilized to reduce the radiation dose to as low as reasonably achievable. COMPARISON: Prior examination of 04/29/2024. CLINICAL HISTORY: Abdominal/flank pain, stone suspected. FINDINGS: LOWER CHEST: No acute abnormality. LIVER: The liver is unremarkable. GALLBLADDER AND BILE DUCTS: Gallbladder is unremarkable. No biliary ductal dilatation. SPLEEN: No acute abnormality. PANCREAS: No acute abnormality. ADRENAL GLANDS: No acute abnormality. KIDNEYS, URETERS AND BLADDER: Stable asymmetric cortical scarring in the left kidney. The kidneys are otherwise unremarkable. No stones in the kidneys or ureters. No hydronephrosis. No perinephric or periureteral stranding. Urinary bladder is unremarkable. GI AND BOWEL: The appendix is normal. The stomach, small bowel, and large bowel are otherwise unremarkable.  PERITONEUM AND RETROPERITONEUM: No ascites. No free air. VASCULATURE: Aorta is normal in caliber. Mild aortoiliac atherosclerotic calcification. No aortic aneurysm. LYMPH NODES: No lymphadenopathy. REPRODUCTIVE ORGANS: Additional markers noted within the prostate gland. High-density material seen along the posterior capsule of the prostate gland likely reflects spacer gel. BONES AND SOFT TISSUES: Degenerative changes are seen at L4-L5 and L5-S1. Osseous structures are otherwise unremarkable. Left inguinal cord lipoma. IMPRESSION: 1. No acute findings. Electronically signed by: Dorethia Molt MD 05/06/2024 03:19 AM EST RP Workstation: [id number]    {Document cardiac monitor, telemetry assessment procedure when appropriate:32947} Procedures   Medications Ordered in the ED  naproxen  (NAPROSYN ) tablet 500 mg (500 mg Oral Patient Refused/Not Given 05/06/24 0303)      {Click here for ABCD2, HEART and other calculators REFRESH Note before signing:1}                              Medical Decision Making Amount and/or Complexity of Data Reviewed Radiology: ordered.  Risk Prescription drug  management.   ***  {Document critical care time when appropriate  Document review of labs and clinical decision tools ie CHADS2VASC2, etc  Document your independent review of radiology images and any outside records  Document your discussion with family members, caretakers and with consultants  Document social determinants of health affecting pt's care  Document your decision making why or why not admission, treatments were needed:32947:::1}   Final diagnoses:  None    ED Discharge Orders     None

## 2024-05-06 NOTE — Discharge Instructions (Signed)
 Your evaluation in the emergency department today was reassuring and did not reveal a concerning cause of your symptoms.  You do not have any evidence of kidney stone, cyst of your kidney, or urinary tract infection.  Continue your daily prescribed medications.  We recommend over-the-counter medications such as ibuprofen  or Aleve  for pain.  Follow-up with your primary care doctor.

## 2024-05-14 ENCOUNTER — Other Ambulatory Visit (HOSPITAL_COMMUNITY): Payer: Self-pay

## 2024-05-14 ENCOUNTER — Ambulatory Visit: Payer: Self-pay

## 2024-05-14 NOTE — Telephone Encounter (Signed)
 Pt agreeable to go to [facility] Emergency Department.  FYI Only or Action Required?: FYI only for provider: ED advised.  Patient was last seen in primary care on 02/21/2024 by Mayers, Kirk RAMAN, PA-C.  Called Nurse Triage reporting Leg Pain.  Symptoms began several weeks ago.  Interventions attempted: Rest, hydration, or home remedies.  Symptoms are: gradually worsening.  Triage Disposition: See HCP Within 4 Hours (Or PCP Triage)  Patient/caregiver understands and will follow disposition?: Yes Reason for Disposition  [1] Thigh or calf pain AND [2] only 1 side AND [3] present > 1 hour  (Exception: Chronic unchanged pain.)  Answer Assessment - Initial Assessment Questions Pt reports that he wants his labwork checked. This RN asked for clarification and he states that his right leg wants to go numb, mostly behind his knee, where it is also painful. Has been occurring 2+ weeks. Denies SOB or chest pain.  Denies hx of DVT.  1. ONSET: When did the pain start?      2+ weeks 2. LOCATION: Where is the pain located?      Right leg 3. PAIN: How bad is the pain?    (Scale 1-10; or mild, moderate, severe)     *No Answer* 4. WORK OR EXERCISE: Has there been any recent work or exercise that involved this part of the body?      *No Answer* 5. CAUSE: What do you think is causing the leg pain?     *No Answer* 6. OTHER SYMPTOMS: Do you have any other symptoms? (e.g., chest pain, back pain, breathing difficulty, swelling, rash, fever, numbness, weakness)     *No Answer* 7. PREGNANCY: Is there any chance you are pregnant? When was your last menstrual period?     *No Answer*  Protocols used: Leg Pain-A-AH Copied from CRM #[id number]. Topic: Clinical - Red Word Triage >> May 14, 2024 12:46 PM Thersia BROCKS wrote: [facility] that prompted transfer to Nurse Triage: Patient called in stated he is having pain and aching in his right leg, states it may be a blood clout would like to get it checked  out >> May 14, 2024 12:58 PM Thersia C wrote: Patient disconnected while waiting on hold for nt, unable to get patient back   Past Medical History:  Diagnosis Date   Asthma    Hepatitis C    per detention nurse   Hypertension    Neuropathy    Syphilis    per detention nurse   Wears partial dentures    upper/lower

## 2024-05-14 NOTE — Telephone Encounter (Signed)
FYI. Pt going to ED.  

## 2024-05-18 ENCOUNTER — Other Ambulatory Visit (HOSPITAL_COMMUNITY): Payer: Self-pay

## 2024-05-22 ENCOUNTER — Encounter (HOSPITAL_COMMUNITY): Payer: Self-pay | Admitting: Emergency Medicine

## 2024-05-22 ENCOUNTER — Other Ambulatory Visit: Payer: Self-pay

## 2024-05-22 ENCOUNTER — Emergency Department (HOSPITAL_COMMUNITY)
Admission: EM | Admit: 2024-05-22 | Discharge: 2024-05-23 | Disposition: A | Discharge status: Home / Self Care | Attending: Emergency Medicine | Admitting: Emergency Medicine

## 2024-05-22 ENCOUNTER — Emergency Department (HOSPITAL_COMMUNITY)

## 2024-05-22 DIAGNOSIS — R10A2 Flank pain, left side: Secondary | ICD-10-CM | POA: Diagnosis present

## 2024-05-22 DIAGNOSIS — K5900 Constipation, unspecified: Secondary | ICD-10-CM | POA: Insufficient documentation

## 2024-05-22 DIAGNOSIS — M545 Low back pain, unspecified: Secondary | ICD-10-CM | POA: Diagnosis not present

## 2024-05-22 LAB — LIPASE, BLOOD: Lipase: 26 U/L (ref 11–51)

## 2024-05-22 LAB — CBC WITH DIFFERENTIAL/PLATELET
Abs Immature Granulocytes: 0.02 K/uL (ref 0.00–0.07)
Basophils Absolute: 0 K/uL (ref 0.0–0.1)
Basophils Relative: 1 %
Eosinophils Absolute: 0.1 K/uL (ref 0.0–0.5)
Eosinophils Relative: 2 %
HCT: 33 % — ABNORMAL LOW (ref 39.0–52.0)
Hemoglobin: 11.2 g/dL — ABNORMAL LOW (ref 13.0–17.0)
Immature Granulocytes: 0 %
Lymphocytes Relative: 27 %
Lymphs Abs: 1.7 K/uL (ref 0.7–4.0)
MCH: 33.3 pg (ref 26.0–34.0)
MCHC: 33.9 g/dL (ref 30.0–36.0)
MCV: 98.2 fL (ref 80.0–100.0)
Monocytes Absolute: 0.5 K/uL (ref 0.1–1.0)
Monocytes Relative: 9 %
Neutro Abs: 3.8 K/uL (ref 1.7–7.7)
Neutrophils Relative %: 61 %
Platelets: 145 K/uL — ABNORMAL LOW (ref 150–400)
RBC: 3.36 MIL/uL — ABNORMAL LOW (ref 4.22–5.81)
RDW: 12.2 % (ref 11.5–15.5)
WBC: 6.2 K/uL (ref 4.0–10.5)
nRBC: 0 % (ref 0.0–0.2)

## 2024-05-22 LAB — URINALYSIS, ROUTINE W REFLEX MICROSCOPIC
Bacteria, UA: NONE SEEN
Bilirubin Urine: NEGATIVE
Glucose, UA: NEGATIVE mg/dL
Ketones, ur: NEGATIVE mg/dL
Leukocytes,Ua: NEGATIVE
Nitrite: NEGATIVE
Protein, ur: NEGATIVE mg/dL
Specific Gravity, Urine: 1.019 (ref 1.005–1.030)
pH: 5 (ref 5.0–8.0)

## 2024-05-22 LAB — COMPREHENSIVE METABOLIC PANEL WITH GFR
ALT: 19 U/L (ref 0–44)
AST: 29 U/L (ref 15–41)
Albumin: 4.2 g/dL (ref 3.5–5.0)
Alkaline Phosphatase: 58 U/L (ref 38–126)
Anion gap: 14 (ref 5–15)
BUN: 21 mg/dL (ref 8–23)
CO2: 21 mmol/L — ABNORMAL LOW (ref 22–32)
Calcium: 9.7 mg/dL (ref 8.9–10.3)
Chloride: 103 mmol/L (ref 98–111)
Creatinine, Ser: 1.54 mg/dL — ABNORMAL HIGH (ref 0.61–1.24)
GFR, Estimated: 51 mL/min — ABNORMAL LOW
Glucose, Bld: 131 mg/dL — ABNORMAL HIGH (ref 70–99)
Potassium: 4.1 mmol/L (ref 3.5–5.1)
Sodium: 137 mmol/L (ref 135–145)
Total Bilirubin: 0.3 mg/dL (ref 0.0–1.2)
Total Protein: 7.7 g/dL (ref 6.5–8.1)

## 2024-05-22 MED ORDER — IOHEXOL 350 MG/ML SOLN
75.0000 mL | Freq: Once | INTRAVENOUS | Status: AC | PRN
  Administered 2024-05-22: 75 mL via INTRAVENOUS

## 2024-05-22 NOTE — ED Triage Notes (Signed)
 Pt is here for left flank pain x3 days.

## 2024-05-22 NOTE — ED Triage Notes (Signed)
 Pt denies any urinary symptoms or blood on the urine.

## 2024-05-22 NOTE — ED Provider Triage Note (Signed)
 Emergency Medicine Provider Triage Evaluation Note  Shaun Morgan , a 63 y.o. male  was evaluated in triage.  Pt complains of left flank pain.  Also endorses constipation.  Endorses abdominal distention.  Denies nausea or vomiting.  No prior history of kidney stones and denies UTI complaints.  Review of Systems  Positive: As above Negative: As above  Physical Exam  BP (!) 145/100 (BP Location: Left Arm)   Pulse (!) 103   Temp 98 F (36.7 C)   Resp 18   Ht 5' 9 (1.753 m)   Wt 109 kg   SpO2 98%   BMI 35.49 kg/m  Gen:   Awake, no distress   Resp:  Normal effort  MSK:   Moves extremities without difficulty  Other:    Medical Decision Making  Medically screening exam initiated at 7:20 PM.  Appropriate orders placed.  Shaun Morgan was informed that the remainder of the evaluation will be completed by another provider, this initial triage assessment does not replace that evaluation, and the importance of remaining in the ED until their evaluation is complete.    Hildegard Loge, PA-C 05/22/24 1920

## 2024-05-23 ENCOUNTER — Other Ambulatory Visit (HOSPITAL_COMMUNITY): Payer: Self-pay

## 2024-05-23 MED ORDER — DOCUSATE SODIUM 100 MG PO CAPS
100.0000 mg | ORAL_CAPSULE | Freq: Two times a day (BID) | ORAL | 0 refills | Status: AC
  Filled 2024-05-23: qty 60, 30d supply, fill #0

## 2024-05-23 MED ORDER — KETOROLAC TROMETHAMINE 30 MG/ML IJ SOLN
15.0000 mg | Freq: Once | INTRAMUSCULAR | Status: AC
  Administered 2024-05-23: 15 mg via INTRAVENOUS
  Filled 2024-05-23: qty 1

## 2024-05-23 MED ORDER — POLYETHYLENE GLYCOL 3350 17 GM/SCOOP PO POWD
17.0000 g | Freq: Two times a day (BID) | ORAL | 0 refills | Status: AC | PRN
  Filled 2024-05-23 (×2): qty 238, 7d supply, fill #0

## 2024-05-23 MED ORDER — METHOCARBAMOL 500 MG PO TABS
500.0000 mg | ORAL_TABLET | Freq: Three times a day (TID) | ORAL | 0 refills | Status: AC | PRN
  Filled 2024-05-23: qty 20, 7d supply, fill #0

## 2024-05-23 NOTE — ED Provider Notes (Signed)
 " [place] EMERGENCY DEPARTMENT AT [facility] Provider Note   CSN: [id number] Arrival date & time: 05/22/24  8147     Patient presents with: Flank Pain   Shaun Morgan is a 63 y.o. male.   Patient presents to the emergency department for evaluation of left flank pain that has been ongoing for 3 days.  He has had some mild abdominal distention and feels constipated.  Patient does report that the pain in his back worsens with movements.  He denies any injury.       Prior to Admission medications  Medication Sig Start Date End Date Taking? Authorizing Provider  docusate sodium  (COLACE) 100 MG capsule Take 1 capsule (100 mg total) by mouth every 12 (twelve) hours. 05/23/24  Yes Balen Woolum, Lonni PARAS, MD  methocarbamol  (ROBAXIN ) 500 MG tablet Take 1 tablet (500 mg total) by mouth every 8 (eight) hours as needed for muscle spasms. 05/23/24  Yes Faithe Ariola, Lonni PARAS, MD  polyethylene glycol (MIRALAX  / GLYCOLAX ) 17 g packet Take 17 g by mouth 2 (two) times daily as needed for moderate constipation or severe constipation. 05/23/24  Yes Ramell Wacha, Lonni PARAS, MD  albuterol  (VENTOLIN  HFA) 108 (90 Base) MCG/ACT inhaler Inhale 2 puffs into the lungs every 6 (six) hours as needed for wheezing or shortness of breath. 08/25/23   Almarie Waddell NOVAK, NP  alendronate  (FOSAMAX ) 70 MG tablet Take 1 tablet (70 mg total) by mouth once a week. Take with a full glass of water on an empty stomach. 11/14/23   Stoneking, Adine PARAS., MD  atorvastatin  (LIPITOR) 20 MG tablet Take 1 tablet (20 mg total) by mouth daily. 01/29/24 05/01/24  Bauer, Collin S, PA-C  bisacodyl  (DULCOLAX) 10 MG suppository Place 1 suppository (10 mg total) rectally as needed for moderate constipation. 06/29/23   Theadore Ozell HERO, MD  Calcium  Carb-Cholecalciferol  (CALCIUM  + VITAMIN D3) 600-10 MG-MCG TABS Take 1 tablet by mouth 2 (two) times daily with a meal. 12/27/22   Bruning, Ashlyn, PA-C  fluticasone -salmeterol (WIXELA INHUB)  100-50 MCG/ACT AEPB Inhale 1 puff into the lungs 2 (two) times daily. 04/06/23   Almarie Waddell NOVAK, NP  gabapentin  (NEURONTIN ) 300 MG capsule Take 1 capsule (300 mg total) by mouth 3 (three) times daily. 06/06/23   Almarie Waddell NOVAK, NP  hydrocortisone  cream 1 % Apply 1 Application topically 2 (two) times daily. 12/27/22   Bruning, Ashlyn, PA-C  ketoconazole  (NIZORAL ) 2 % cream Apply 1 Application topically daily. 06/03/23   Gershon Donnice SAUNDERS, DPM  lidocaine  (XYLOCAINE ) 5 % ointment Apply 1 Application topically as needed. 01/07/23   Randol Simmonds, MD  lisinopril -hydrochlorothiazide  (ZESTORETIC ) 20-25 MG tablet Take 1 tablet by mouth daily. 05/02/24   Kammerer, Megan L, DO  nicotine  (NICODERM CQ  - DOSED IN MG/24 HOURS) 14 mg/24hr patch Place 1 patch (14 mg total) onto the skin daily. 11/29/23   Ladona Heinz, MD  Sofosbuv-Velpatasv-Voxilaprev (VOSEVI ) 400-100-100 MG TABS Take 1 tablet by mouth daily. 02/07/24   Melvenia Corean SAILOR, NP  solifenacin  (VESICARE ) 5 MG tablet Take 1 tablet (5 mg total) by mouth at bedtime. 11/14/23   Stoneking, Adine PARAS., MD  tamsulosin  (FLOMAX ) 0.4 MG CAPS capsule Take 2 capsules (0.8 mg total) by mouth daily after supper. 03/08/24   Stoneking, Adine PARAS., MD    Allergies: Acetaminophen     Review of Systems  Updated Vital Signs BP 114/77 (BP Location: Left Arm)   Pulse 89   Temp 97.7 F (36.5 C)   Resp 17  Ht 5' 9 (1.753 m)   Wt 109 kg   SpO2 94%   BMI 35.49 kg/m   Physical Exam Vitals and nursing note reviewed.  Constitutional:      General: He is not in acute distress.    Appearance: He is well-developed.  HENT:     Head: Normocephalic and atraumatic.     Mouth/Throat:     Mouth: Mucous membranes are moist.  Eyes:     General: Vision grossly intact. Gaze aligned appropriately.     Extraocular Movements: Extraocular movements intact.     Conjunctiva/sclera: Conjunctivae normal.  Cardiovascular:     Rate and Rhythm: Normal rate and regular rhythm.     Pulses: Normal  pulses.     Heart sounds: Normal heart sounds, S1 normal and S2 normal. No murmur heard.    No friction rub. No gallop.  Pulmonary:     Effort: Pulmonary effort is normal. No respiratory distress.     Breath sounds: Normal breath sounds.  Abdominal:     Palpations: Abdomen is soft.     Tenderness: There is no abdominal tenderness. There is no guarding or rebound.     Hernia: No hernia is present.  Musculoskeletal:        General: No swelling.     Cervical back: Full passive range of motion without pain, normal range of motion and neck supple. No pain with movement, spinous process tenderness or muscular tenderness. Normal range of motion.     Lumbar back: Spasms and tenderness present. Decreased range of motion. Negative right straight leg raise test and negative left straight leg raise test.       Back:     Right lower leg: No edema.     Left lower leg: No edema.  Skin:    General: Skin is warm and dry.     Capillary Refill: Capillary refill takes less than 2 seconds.     Findings: No ecchymosis, erythema, lesion or wound.  Neurological:     Mental Status: He is alert and oriented to person, place, and time.     GCS: GCS eye subscore is 4. GCS verbal subscore is 5. GCS motor subscore is 6.     Cranial Nerves: Cranial nerves 2-12 are intact.     Sensory: Sensation is intact.     Motor: Motor function is intact. No weakness or abnormal muscle tone.     Coordination: Coordination is intact.  Psychiatric:        Mood and Affect: Mood normal.        Speech: Speech normal.        Behavior: Behavior normal.     (all labs ordered are listed, but only abnormal results are displayed) Labs Reviewed  CBC WITH DIFFERENTIAL/PLATELET - Abnormal; Notable for the following components:      Result Value   RBC 3.36 (*)    Hemoglobin 11.2 (*)    HCT 33.0 (*)    Platelets 145 (*)    All other components within normal limits  COMPREHENSIVE METABOLIC PANEL WITH GFR - Abnormal; Notable for the  following components:   CO2 21 (*)    Glucose, Bld 131 (*)    Creatinine, Ser 1.54 (*)    GFR, Estimated 51 (*)    All other components within normal limits  URINALYSIS, ROUTINE W REFLEX MICROSCOPIC - Abnormal; Notable for the following components:   Hgb urine dipstick SMALL (*)    All other components within normal limits  LIPASE, BLOOD    EKG: None  Radiology: CT ABDOMEN PELVIS W CONTRAST Result Date: 05/22/2024 CLINICAL DATA:  Left flank pain. Patient reports constipation and distention. EXAM: CT ABDOMEN AND PELVIS WITH CONTRAST TECHNIQUE: Multidetector CT imaging of the abdomen and pelvis was performed using the standard protocol following bolus administration of intravenous contrast. RADIATION DOSE REDUCTION: This exam was performed according to the departmental dose-optimization program which includes automated exposure control, adjustment of the mA and/or kV according to patient size and/or use of iterative reconstruction technique. CONTRAST:  75mL OMNIPAQUE  IOHEXOL  350 MG/ML SOLN COMPARISON:  Most recent CT 05/06/2024 FINDINGS: Lower chest: Clear lung bases. Hepatobiliary: Nodular hepatic contours with hypertrophy of the left hepatic lobe. No focal liver lesion. Gallbladder physiologically distended, no calcified stone. No biliary dilatation. Pancreas: No ductal dilatation or inflammation. Spleen: Normal in size without focal abnormality. Adrenals/Urinary Tract: No adrenal nodule. No hydronephrosis or perinephric edema. Homogeneous renal enhancement with symmetric excretion on delayed phase imaging. Left renal cortical scarring. No renal stone. Urinary bladder is only minimally distended, normal for degree of distension. Stomach/Bowel: Ingested material within the stomach. No bowel obstruction or inflammatory change. Fecalization of small bowel contents. The appendix is normal. Moderate colonic stool burden. No abnormal rectal distention. Vascular/Lymphatic: Aortic atherosclerosis. No  aneurysm. The portal vein is patent. No abdominopelvic adenopathy. Reproductive: No acute findings. Other: No free air, free fluid, or intra-abdominal fluid collection. Minimal fat in the inguinal canals. Musculoskeletal: Stable L5-S1 degenerative disc disease and trace retrolisthesis. No acute osseous findings. IMPRESSION: 1. No acute abnormality in the abdomen/pelvis. 2. Moderate colonic stool burden with fecalization of small bowel contents, suggesting slow transit. No bowel obstruction or inflammation. 3. Nodular hepatic contours with hypertrophy of the left hepatic lobe, suspicious for cirrhosis. Aortic Atherosclerosis (ICD10-I70.0). Electronically Signed   By: Andrea Gasman M.D.   On: 05/22/2024 21:47     Procedures   Medications Ordered in the ED  ketorolac  (TORADOL ) 30 MG/ML injection 15 mg (has no administration in time range)  iohexol  (OMNIPAQUE ) 350 MG/ML injection 75 mL (75 mLs Intravenous Contrast Given 05/22/24 2112)                                    Medical Decision Making  Differential Diagnosis considered includes, but not limited to: Renal colic/kidney stone; pyelonephritis; aortic dissection; musculoskeletal pain.  Presents to the emergency department for evaluation of left flank pain.  Patient noted to have significant worsening of his pain when trying to roll over in the bed, pain is reproducible with movements and palpation.  There is evidence of paraspinal muscle tenderness and spasm on exam.  Pain is nonradicular, no red flags.  Workup has been otherwise unremarkable.  Lab work and CT abdomen and pelvis without acute pathology.  There is evidence of constipation which patient clinically reported as well.  Treat for musculoskeletal back pain.     Final diagnoses:  Acute left-sided low back pain without sciatica  Constipation, unspecified constipation type    ED Discharge Orders          Ordered    methocarbamol  (ROBAXIN ) 500 MG tablet  Every 8 hours PRN         05/23/24 0331    docusate sodium  (COLACE) 100 MG capsule  Every 12 hours        05/23/24 0331    polyethylene glycol (MIRALAX  / GLYCOLAX ) 17 g packet  2 times  daily PRN        05/23/24 0331               Haze Lonni PARAS, MD 05/23/24 [phone]  "

## 2024-05-28 ENCOUNTER — Other Ambulatory Visit (HOSPITAL_COMMUNITY): Payer: Self-pay

## 2024-05-28 MED ORDER — LISINOPRIL-HYDROCHLOROTHIAZIDE 20-25 MG PO TABS
1.0000 | ORAL_TABLET | Freq: Every day | ORAL | 0 refills | Status: AC
  Filled 2024-05-28: qty 90, 90d supply, fill #0

## 2024-05-28 NOTE — Progress Notes (Signed)
 Patient presents for  Chief Complaint  Patient presents with   New Patient   Follow-up    [facility]   Shaun Morgan presents to primary care clinic for New Patient ER Follow up.  Fasting    Last CPE: over year with  w/[place]   SUBJECTIVE   New Patient Establish   Hospital follow up from:([facility])    Admission date: (05/22/2024 19:20 PM) Discharge date: (05/23/2024 3:22 AM)  New diagnosis:Acute left-sided low back pain without sciatica Constipation   Medication changes: (to take stool softner)   The following studies were performed: (CT ABD)   Appointments are pending with:(none)    Today  Constant Burning sensation around left side of back for almost a month.  Has a hard time going to the bathroom, Takes medication to help with this.  Felt was but told not kidney. Not started stool softner.  Question on doing DEXA scan. States previous doctor would not refill meds and he would run out. States he had prostate surgery and takes miralax  daily anyway    Diagnosed with hepatitis C being seen at infectious disease Recently finished treatment this was his second treatment of vesicare    Reviewed and copied per [facility]. Pertinent labs & imaging results that were available of the patient were reviewed  [place] [facility] Provider Note     CSN: [id number] Arrival date & time: 05/22/24  [id number]       Patient presents with: Flank Pain     Shaun Morgan is a 63 y.o. male.     Patient presents to the emergency department for evaluation of left flank pain that has been ongoing for 3 days.  He has had some mild abdominal distention and feels constipated.  Patient does report that the pain in his back worsens with movements.  He denies any injury.                 Prior to Admission medications  Medication Sig Start Date End Date Taking? Authorizing Provider  docusate sodium  (COLACE)  100 MG capsule Take 1 capsule (100 mg total) by mouth every 12 (twelve) hours. 05/23/24   Yes Pollina, Lonni PARAS, MD  methocarbamol  (ROBAXIN ) 500 MG tablet Take 1 tablet (500 mg total) by mouth every 8 (eight) hours as needed for muscle spasms. 05/23/24   Yes Pollina, Lonni PARAS, MD  polyethylene glycol (MIRALAX  / GLYCOLAX ) 17 g packet Take 17 g by mouth 2 (two) times daily as needed for moderate constipation or severe constipation. 05/23/24   Yes Pollina, Lonni PARAS, MD  albuterol  (VENTOLIN  HFA) 108 (90 Base) MCG/ACT inhaler Inhale 2 puffs into the lungs every 6 (six) hours as needed for wheezing or shortness of breath. 08/25/23     Almarie Waddell NOVAK, NP  alendronate  (FOSAMAX ) 70 MG tablet Take 1 tablet (70 mg total) by mouth once a week. Take with a full glass of water on an empty stomach. 11/14/23     Stoneking, Adine PARAS., MD  atorvastatin  (LIPITOR) 20 MG tablet Take 1 tablet (20 mg total) by mouth daily. 01/29/24 05/01/24   Bauer, Collin S, PA-C  bisacodyl  (DULCOLAX) 10 MG suppository Place 1 suppository (10 mg total) rectally as needed for moderate constipation. 06/29/23     Theadore Ozell HERO, MD  Calcium  Carb-Cholecalciferol  (CALCIUM  + VITAMIN D3) 600-10 MG-MCG TABS Take 1 tablet by mouth 2 (two) times daily with a meal. 12/27/22     Bruning,  Ashlyn, PA-C  fluticasone -salmeterol (WIXELA INHUB) 100-50 MCG/ACT AEPB Inhale 1 puff into the lungs 2 (two) times daily. 04/06/23     Almarie Waddell NOVAK, NP  gabapentin  (NEURONTIN ) 300 MG capsule Take 1 capsule (300 mg total) by mouth 3 (three) times daily. 06/06/23     Almarie Waddell NOVAK, NP  hydrocortisone  cream 1 % Apply 1 Application topically 2 (two) times daily. 12/27/22     Bruning, Ashlyn, PA-C  ketoconazole  (NIZORAL ) 2 % cream Apply 1 Application topically daily. 06/03/23     Gershon Donnice SAUNDERS, DPM  lidocaine  (XYLOCAINE ) 5 % ointment Apply 1 Application topically as needed. 01/07/23     Randol Simmonds, MD  lisinopril -hydrochlorothiazide  (ZESTORETIC ) 20-25 MG tablet  Take 1 tablet by mouth daily. 05/02/24     Kammerer, Megan L, DO  nicotine  (NICODERM CQ  - DOSED IN MG/24 HOURS) 14 mg/24hr patch Place 1 patch (14 mg total) onto the skin daily. 11/29/23     Ladona Heinz, MD  Sofosbuv-Velpatasv-Voxilaprev (VOSEVI ) 400-100-100 MG TABS Take 1 tablet by mouth daily. 02/07/24     Melvenia Corean SAILOR, NP  solifenacin  (VESICARE ) 5 MG tablet Take 1 tablet (5 mg total) by mouth at bedtime. 11/14/23     Stoneking, Adine PARAS., MD  tamsulosin  (FLOMAX ) 0.4 MG CAPS capsule Take 2 capsules (0.8 mg total) by mouth daily after supper. 03/08/24     Stoneking, Adine PARAS., MD      Allergies: Acetaminophen      Review of Systems   Updated Vital Signs BP 114/77 (BP Location: Left Arm)   Pulse 89   Temp 97.7 F (36.5 C)   Resp 17   Ht 5' 9 (1.753 m)   Wt 109 kg   SpO2 94%   BMI 35.49 kg/m    Physical Exam Vitals and nursing note reviewed.  Constitutional:      General: He is not in acute distress.    Appearance: He is well-developed.  HENT:     Head: Normocephalic and atraumatic.     Mouth/Throat:     Mouth: Mucous membranes are moist.  Eyes:     General: Vision grossly intact. Gaze aligned appropriately.     Extraocular Movements: Extraocular movements intact.     Conjunctiva/sclera: Conjunctivae normal.  Cardiovascular:     Rate and Rhythm: Normal rate and regular rhythm.     Pulses: Normal pulses.     Heart sounds: Normal heart sounds, S1 normal and S2 normal. No murmur heard.    No friction rub. No gallop.  Pulmonary:     Effort: Pulmonary effort is normal. No respiratory distress.     Breath sounds: Normal breath sounds.  Abdominal:     Palpations: Abdomen is soft.     Tenderness: There is no abdominal tenderness. There is no guarding or rebound.     Hernia: No hernia is present.  Musculoskeletal:        General: No swelling.     Cervical back: Full passive range of motion without pain, normal range of motion and neck supple. No pain with movement, spinous  process tenderness or muscular tenderness. Normal range of motion.     Lumbar back: Spasms and tenderness present. Decreased range of motion. Negative right straight leg raise test and negative left straight leg raise test.       Back:     Right lower leg: No edema.     Left lower leg: No edema.  Skin:    General: Skin is warm and dry.  Capillary Refill: Capillary refill takes less than 2 seconds.     Findings: No ecchymosis, erythema, lesion or wound.  Neurological:     Mental Status: He is alert and oriented to person, place, and time.     GCS: GCS eye subscore is 4. GCS verbal subscore is 5. GCS motor subscore is 6.     Cranial Nerves: Cranial nerves 2-12 are intact.     Sensory: Sensation is intact.     Motor: Motor function is intact. No weakness or abnormal muscle tone.     Coordination: Coordination is intact.  Psychiatric:        Mood and Affect: Mood normal.        Speech: Speech normal.        Behavior: Behavior normal.       (all labs ordered are listed, but only abnormal results are displayed)      Labs Reviewed  CBC WITH DIFFERENTIAL/PLATELET - Abnormal; Notable for the following components:      Result Value     RBC 3.36 (*)      Hemoglobin 11.2 (*)      HCT 33.0 (*)      Platelets 145 (*)      All other components within normal limits  COMPREHENSIVE METABOLIC PANEL WITH GFR - Abnormal; Notable for the following components:    CO2 21 (*)      Glucose, Bld 131 (*)      Creatinine, Ser 1.54 (*)      GFR, Estimated 51 (*)      All other components within normal limits  URINALYSIS, ROUTINE W REFLEX MICROSCOPIC - Abnormal; Notable for the following components:    Hgb urine dipstick SMALL (*)      All other components within normal limits  LIPASE, BLOOD      EKG: None   Radiology: CT ABDOMEN PELVIS W CONTRAST Result Date: 05/22/2024 CLINICAL DATA:  Left flank pain. Patient reports constipation and distention. EXAM: CT ABDOMEN AND PELVIS WITH CONTRAST  TECHNIQUE: Multidetector CT imaging of the abdomen and pelvis was performed using the standard protocol following bolus administration of intravenous contrast. RADIATION DOSE REDUCTION: This exam was performed according to the departmental dose-optimization program which includes automated exposure control, adjustment of the mA and/or kV according to patient size and/or use of iterative reconstruction technique. CONTRAST:  75mL OMNIPAQUE  IOHEXOL  350 MG/ML SOLN COMPARISON:  Most recent CT 05/06/2024 FINDINGS: Lower chest: Clear lung bases. Hepatobiliary: Nodular hepatic contours with hypertrophy of the left hepatic lobe. No focal liver lesion. Gallbladder physiologically distended, no calcified stone. No biliary dilatation. Pancreas: No ductal dilatation or inflammation. Spleen: Normal in size without focal abnormality. Adrenals/Urinary Tract: No adrenal nodule. No hydronephrosis or perinephric edema. Homogeneous renal enhancement with symmetric excretion on delayed phase imaging. Left renal cortical scarring. No renal stone. Urinary bladder is only minimally distended, normal for degree of distension. Stomach/Bowel: Ingested material within the stomach. No bowel obstruction or inflammatory change. Fecalization of small bowel contents. The appendix is normal. Moderate colonic stool burden. No abnormal rectal distention. Vascular/Lymphatic: Aortic atherosclerosis. No aneurysm. The portal vein is patent. No abdominopelvic adenopathy. Reproductive: No acute findings. Other: No free air, free fluid, or intra-abdominal fluid collection. Minimal fat in the inguinal canals. Musculoskeletal: Stable L5-S1 degenerative disc disease and trace retrolisthesis. No acute osseous findings. IMPRESSION: 1. No acute abnormality in the abdomen/pelvis. 2. Moderate colonic stool burden with fecalization of small bowel contents, suggesting slow transit. No bowel obstruction or inflammation. 3.  Nodular hepatic contours with hypertrophy of  the left hepatic lobe, suspicious for cirrhosis. Aortic Atherosclerosis (ICD10-I70.0). Electronically Signed   By: Andrea Gasman M.D.   On: 05/22/2024 21:47        Procedures    Medications Ordered in the ED  ketorolac  (TORADOL ) 30 MG/ML injection 15 mg (has no administration in time range)  iohexol  (OMNIPAQUE ) 350 MG/ML injection 75 mL (75 mLs Intravenous Contrast Given 05/22/24 2112)                                      Medical Decision Making   Differential Diagnosis considered includes, but not limited to: Renal colic/kidney stone; pyelonephritis; aortic dissection; musculoskeletal pain.   Presents to the emergency department for evaluation of left flank pain.  Patient noted to have significant worsening of his pain when trying to roll over in the bed, pain is reproducible with movements and palpation.  There is evidence of paraspinal muscle tenderness and spasm on exam.  Pain is nonradicular, no red flags.   Workup has been otherwise unremarkable.  Lab work and CT abdomen and pelvis without acute pathology.  There is evidence of constipation which patient clinically reported as well.  Treat for musculoskeletal back pain.       Final diagnoses:  Acute left-sided low back pain without sciatica  Constipation, unspecified constipation type      ED Discharge Orders             Ordered      methocarbamol  (ROBAXIN ) 500 MG tablet  Every 8 hours PRN        05/23/24 0331      docusate sodium  (COLACE) 100 MG capsule  Every 12 hours        05/23/24 0331      polyethylene glycol (MIRALAX  / GLYCOLAX ) 17 g packet  2 times daily PRN        05/23/24 0331                      Haze Lonni PARAS, MD 05/23/24 [phone]          Electronically signed by Lonni PARAS Haze, MD at 05/23/2024  3:31 AM    End copy       Allergies[1]  Current Medications[2]  The following portions of the patient's history were reviewed and updated as appropriate: allergies, current  medications, past medical history, past social history, past family history, and problem list.   ROS   See HPI.  OBJECTIVE  BP 116/80 (BP Location: Left arm, Patient Position: Sitting)   Pulse 76   Temp 98.2 F (36.8 C) (Oral)   Resp 18   Ht 1.753 m (5' 9)   Wt 106 kg (234 lb 3.2 oz)   SpO2 97%   BMI 34.59 kg/m   GENERAL APPEARANCE: Well appearing, well developed, no acute distress. HEENT:  Conjunctiva without erythema or drainage. External auditory canals are non-swollen without exudate present. Tympanic membranes are normal. Nasal turbinates are without edema or drainage. Oropharynx is without lesion or exudate.       NECK: Supple without lymphadenopathy, bruit, or thyromegaly. LUNGS: Clear to auscultation bilaterally HEART: Regular rate and rhythm without murmur. ABDOMEN: Normal bowel sounds, soft, non-tender, without hepato-splenomegaly EXTREMITIES: No edema NEUROLOGIC: Alert and oriented x3, no obvious deficits.  SKIN:  No rashes or abnormal lesions visible.   Results for orders placed or  performed during the hospital encounter of 06/30/23  Comprehensive Metabolic Panel   Collection Time: 06/30/23  8:51 AM  Result Value Ref Range   Sodium 135 (L) 136 - 145 mmol/L   Potassium 4.7 (H) 3.4 - 4.5 mmol/L   Chloride 104 98 - 107 mmol/L   CO2 24 21 - 31 mmol/L   Anion Gap 7 6 - 14 mmol/L   Glucose, Random 110 (H) 70 - 99 mg/dL   Blood Urea Nitrogen (BUN) 30 (H) 7 - 25 mg/dL   Creatinine 8.30 (H) 9.29 - 1.30 mg/dL   eGFR 46 (L) >40 fO/fpw/8.26f7   Albumin 4.1 3.5 - 5.7 g/dL   Total Protein 8.0 6.4 - 8.9 g/dL   Bilirubin, Total 0.5 0.3 - 1.0 mg/dL   Alkaline Phosphatase (ALP) 86 34 - 104 U/L   Aspartate Aminotransferase (AST) 14 13 - 39 U/L   Alanine Aminotransferase (ALT) 10 7 - 52 U/L   Calcium  9.2 8.6 - 10.3 mg/dL   BUN/Creatinine Ratio 17.8 10.0 - 20.0  CBC with Differential   Collection Time: 06/30/23  8:51 AM  Result Value Ref Range   WBC 4.92 4.40 - 11.00  10*3/uL   RBC 3.47 (L) 4.50 - 5.90 10*6/uL   Hemoglobin 11.4 (L) 14.0 - 17.5 g/dL   Hematocrit 66.6 (L) 58.4 - 50.4 %   Mean Corpuscular Volume (MCV) 95.9 80.0 - 96.0 fL   Mean Corpuscular Hemoglobin (MCH) 32.8 27.5 - 33.2 pg   Mean Corpuscular Hemoglobin Conc (MCHC) 34.2 33.0 - 37.0 g/dL   Red Cell Distribution Width (RDW) 13.5 12.3 - 17.0 %   Platelet Count (PLT) 157 150 - 450 10*3/uL   Mean Platelet Volume (MPV) 7.7 6.8 - 10.2 fL   Neutrophils % 71 %   Lymphocytes % 17 %   Monocytes % 10 %   Eosinophils % 1 %   Basophils % 1 %   Neutrophils Absolute 3.50 1.80 - 7.80 10*3/uL   Lymphocytes # 0.80 (L) 1.00 - 4.80 10*3/uL   Monocytes # 0.50 0.00 - 0.80 10*3/uL   Eosinophils # 0.10 0.00 - 0.50 10*3/uL   Basophils # 0.00 0.00 - 0.20 10*3/uL  Urinalysis with Reflex to Microscopic   Collection Time: 06/30/23  8:59 AM  Result Value Ref Range   Color, Urine Yellow Yellow   Clarity, Urine Clear Clear   Specific Gravity, Urine 1.018 1.005 - 1.025   pH, Urine 5.5 5.0 - 8.0   Protein, Urine Negative Negative, 10 , 20  mg/dL   Glucose, Urine Negative Negative, 30 , 50  mg/dL   Ketones, Urine Negative Negative, Trace mg/dL   Bilirubin, Urine Negative Negative   Blood, Urine Negative Negative, Trace   Nitrite, Urine Negative Negative   Leukocyte Esterase, Urine Negative Negative, 25   Urobilinogen, Urine Normal <2.0 mg/dL    ASSESSMENT/PLAN   1. Encounter to establish care (Primary) Presents to establish care   2. Constipation, unspecified constipation type Recommend increasing fiber and water intake start docusate and continue miralax   3. Encounter for screening for malignant neoplasm of colon  - Ambulatory referral to Gastroenterology; Future  4. Mixed hyperlipidemia Has not taking atorvastatin  in several months will check fasting labs today.  Recommend regular exercise and lipid lowering diet.  Increase fiber.   - Lipid Panel; Future - Comprehensive Metabolic Panel;  Future - Lipid Panel - Comprehensive Metabolic Panel  5. Primary hypertension Controlled on current regimen  Currently out of medication Check bp readings at home  and keep log. Follow low salt diet. Avoid NSAIDs.  - lisinopriL -hydrochlorothiazide  (PRINZIDE ) 20-25 mg per tablet; Take 1 tablet by mouth daily.  Dispense: 90 tablet; Refill: 0  6. Hepatitis C virus infection without hepatic coma, unspecified chronicity Managed by infectious disease   7. Toenail fungus  - Ambulatory referral to Podiatry; Future   Return in about 3 months (around 08/26/2024) for Annual physical.   This document serves as a record of services personally performed by Daron Mace, FNP.  It was created on their behalf by Madelin Kraft, CMA, a trained medical scribe, and Certified Medical Assistant (CMA). During the course of documenting the history, physical exam and medical decision making, I was functioning as a stage manager. The creation of this record is the providers dictation and/or activities during the visit.  Electronically signed by Madelin Kraft, CMA 05/28/2024 10:10 AM     I agree the documentation is accurate and complete.  Electronically signed by: Daron Annitta Mace, NP 05/28/2024 12:13 PM         [1] Allergies Allergen Reactions   Acetaminophen  Other (See Comments)    Hep C  [2]  Current Outpatient Medications:    albuterol  HFA (PROVENTIL  HFA;VENTOLIN  HFA;PROAIR  HFA) 90 mcg/actuation inhaler, Inhale 2 puffs every 6 (six) hours as needed for wheezing or shortness of breath., Disp: , Rfl:    alendronate  (FOSAMAX ) 70 mg tablet, Take 70 mg by mouth. take with a full glass of water on an empty stomach, Disp: , Rfl:    atorvastatin  (LIPITOR) 20 mg tablet, Take 20 mg by mouth daily., Disp: , Rfl:    docusate sodium  (COLACE) 100 mg capsule, Take 100 mg by mouth., Disp: , Rfl:    lisinopriL -hydrochlorothiazide  (PRINZIDE ) 20-25 mg per tablet, Take 1 tablet by mouth daily., Disp:  , Rfl:    methocarbamoL  (ROBAXIN ) 500 mg tablet, Take 500 mg by mouth., Disp: , Rfl:    polyethylene glycol (MIRALAX ) 17 gram powd powder, , Disp: , Rfl:    tamsulosin  (FLOMAX ) 0.4 mg cap, Take 0.8 mg by mouth. 0.8 mg total, Disp: , Rfl:

## 2024-06-08 ENCOUNTER — Other Ambulatory Visit: Payer: Self-pay

## 2024-06-08 ENCOUNTER — Ambulatory Visit: Admitting: Infectious Diseases

## 2024-06-08 VITALS — BP 112/77 | HR 92 | Temp 98.3°F | Ht 69.0 in | Wt 233.2 lb

## 2024-06-08 DIAGNOSIS — B182 Chronic viral hepatitis C: Secondary | ICD-10-CM | POA: Diagnosis not present

## 2024-06-08 NOTE — Patient Instructions (Addendum)
" ° °  Happy early birthday to my birthday twin (my [birth year] is [birth year])   Will repeat your ultrasound to compare to last years test to make sure there is no progression between them.   Please come back to see me in April this year to do a final cure visit   Restart your calcium  and statin medication now that you are done with the Vosevi .  "

## 2024-06-08 NOTE — Progress Notes (Addendum)
 "   Patient Name: Shaun Morgan  Date of Birth: 1960-07-07  MRN: [id number]  PCP: Almarie Waddell NOVAK, NP  Referring Provider: Almarie Waddell NOVAK, NP, Ph#: [phone]   Subjective   Subjective:   Chief Complaint  Patient presents with   Follow-up    Hep C     Hepatitis C Genotype: 1a   Fibrosis Score:  F0-F1 04/2023 Fibrotest pretreatment  Elastography unremarkable ultrasound of RUQ, no hepatic abnormalities noted, median kPa 5.5.   Medication:  Epclusa  x 12 weeks (failed in 2024 d/t adherence lapses)  Vosevi  started 02/13/2024  - 05/07/24     Review of Systems  Constitutional:  Negative for malaise/fatigue and weight loss.  HENT:  Negative for nosebleeds.   Gastrointestinal:  Negative for abdominal pain, diarrhea and vomiting.  All other systems reviewed and are negative.    Past Medical History:  Diagnosis Date   Asthma    Hepatitis C    per detention nurse   Hypertension    Neuropathy    Syphilis    per detention nurse   Wears partial dentures    upper/lower    Outpatient Medications Prior to Visit  Medication Sig Dispense Refill   albuterol  (VENTOLIN  HFA) 108 (90 Base) MCG/ACT inhaler Inhale 2 puffs into the lungs every 6 (six) hours as needed for wheezing or shortness of breath. 18 g 2   alendronate  (FOSAMAX ) 70 MG tablet Take 1 tablet (70 mg total) by mouth once a week. Take with a full glass of water on an empty stomach. 12 tablet 3   bisacodyl  (DULCOLAX) 10 MG suppository Place 1 suppository (10 mg total) rectally as needed for moderate constipation. 12 suppository 0   docusate sodium  (COLACE) 100 MG capsule Take 1 capsule (100 mg total) by mouth every 12 (twelve) hours. 60 capsule 0   fluticasone -salmeterol (WIXELA INHUB) 100-50 MCG/ACT AEPB Inhale 1 puff into the lungs 2 (two) times daily. 1 each 3   hydrocortisone  cream 1 % Apply 1 Application topically 2 (two) times daily. 30 g 0   ketoconazole  (NIZORAL ) 2 % cream Apply 1 Application topically  daily. 60 g 0   lidocaine  (XYLOCAINE ) 5 % ointment Apply 1 Application topically as needed. 35.44 g 0   lisinopril -hydrochlorothiazide  (ZESTORETIC ) 20-25 MG tablet Take 1 tablet by mouth daily. 90 tablet 0   methocarbamol  (ROBAXIN ) 500 MG tablet Take 1 tablet (500 mg total) by mouth every 8 (eight) hours as needed for muscle spasms. 20 tablet 0   nicotine  (NICODERM CQ  - DOSED IN MG/24 HOURS) 14 mg/24hr patch Place 1 patch (14 mg total) onto the skin daily. 28 patch 0   polyethylene glycol powder (GLYCOLAX /MIRALAX ) 17 GM/SCOOP powder Take 17 g by mouth 2 (two) times daily as needed for moderate constipation or severe constipation. Dissolve 1 capful (17g) in 4-8 ounces of liquid and take by mouth daily. 238 g 0   solifenacin  (VESICARE ) 5 MG tablet Take 1 tablet (5 mg total) by mouth at bedtime. 30 tablet 11   tamsulosin  (FLOMAX ) 0.4 MG CAPS capsule Take 2 capsules (0.8 mg total) by mouth daily after supper. 60 capsule 11   atorvastatin  (LIPITOR) 20 MG tablet Take 1 tablet (20 mg total) by mouth daily. (Patient not taking: Reported on 06/08/2024) 90 tablet 0   Calcium  Carb-Cholecalciferol  (CALCIUM  + VITAMIN D3) 600-10 MG-MCG TABS Take 1 tablet by mouth 2 (two) times daily with a meal. (Patient not taking: Reported on 06/08/2024) 60 tablet 2   gabapentin  (NEURONTIN )  300 MG capsule Take 1 capsule (300 mg total) by mouth 3 (three) times daily. (Patient not taking: Reported on 06/08/2024) 90 capsule 3   Sofosbuv-Velpatasv-Voxilaprev (VOSEVI ) 400-100-100 MG TABS Take 1 tablet by mouth daily. (Patient not taking: Reported on 06/08/2024) 28 tablet 2   No facility-administered medications prior to visit.     Allergies  Allergen Reactions   Acetaminophen  Other (See Comments)    Hep C  Other Reaction(s): Other (See Comments), Other (See Comments)    Hep C    Social History   Tobacco Use   Smoking status: Former    Current packs/day: 0.00    Types: Cigarettes, E-cigarettes, Pipe    Quit date: 10/2021     Years since quitting: 2.6   Tobacco comments:    Patient incarcerated 10/2021, no nicotine  products allowed since incarceration per detention center nurse        05/02/23- patient vapes non nocotine   Vaping Use   Vaping status: Never Used  Substance Use Topics   Alcohol use: Not Currently    Alcohol/week: 6.0 standard drinks of alcohol    Types: 6 Standard drinks or equivalent per week   Drug use: Never    Family History  Problem Relation Age of Onset   Epilepsy Mother    Heart disease Father        Objective   Objective:   Vitals:   06/08/24 1019  BP: 112/77  Pulse: 92  Temp: 98.3 F (36.8 C)  TempSrc: Oral  SpO2: 98%  Weight: 233 lb 3.2 oz (105.8 kg)  Height: 5' 9 (1.753 m)    Body mass index is 34.44 kg/m.  Constitutional: in no apparent distress, well developed and well nourished, and oriented times 3 Eyes: anicteric Cardiovascular: Cor RRR and No murmurs Respiratory: clear Gastrointestinal: Bowel sounds are normal, liver is not enlarged, spleen is not enlarged Musculoskeletal: peripheral pulses normal, no pedal edema, no clubbing or cyanosis Skin: negative for - jaundice, spider hemangioma, telangiectasia, palmar erythema, ecchymosis and atrophy; no porphyria cutanea tarda Lymphatic: no cervical lymphadenopathy      Assessment & Plan:     Chronic hepatitis C -  GT 1a -  Pre-treatment Fibrosis screening in Nov 2024 revealed F0-1 on fibrotest and confirmed on elastography with normal appearing liver with median kPa reading 5.5 also c/w no significant fibrosis.  Previously treated with Vosevi , completed treatment on December 8th, 2025. Viral load was undetectable at last check, indicating a good response. No missed doses reported. Over 95% of patients with a negative RNA at 12 weeks post-treatment maintain viral eradication. No signs of decompensation or cirrhosis based on current blood work and imaging. Discussed the importance of continued abstinence from  alcohol to prevent liver damage. Treatment reduces the risk of liver cancer significantly. - Ordered hep C RNA test to confirm viral eradication - Scheduled follow-up appointment in April for SVR check - Advised continuation of alcohol abstinence  Evaluation for possible cirrhosis - Concerns about possible cirrhosis reviewed on recent CT scan findings with regards to comments on nodular appearing liver. Previous imaging in November 2024 with elastography and serum fibrotest were concordant and agreed on minimal/insignificant fibrosis.  CT scan revealed normal appearing spleen which argues against advanced disease / hepatic congestion. CMP and CBC reviewed from other care encounter in December and no signs of synthetic liver dysfunction.  In theory, his untreated hepatitis C from baseline eval in 2024 and treatment completion in 2025 could allow for progression of liver  disease, however, to move from F0-F1 ? F4 in a <12 months seems extremely unlikely. ?if CT over-estimated nodularity.  - Repeat elastography for more sensitive assessment. Consider MRI if still further question.  - Reviewed outside labs and scans in December 2025 - Continue to avoid alcohol   Medication management -  can resume statin and calcium  tablets now that he has completed the Vosevi  > 51m ago.           Orders Placed This Encounter  Procedures   US  ABDOMEN RUQ W/ELASTOGRAPHY    Standing Status:   Future    Expected Date:   06/11/2024    Expiration Date:   12/06/2024    Reason for Exam (SYMPTOM  OR DIAGNOSIS REQUIRED):   hep c follow up    Preferred imaging location?:   [place]   Hepatitis C RNA quantitative ([facility])    No orders of the defined types were placed in this encounter.   Return in about 3 months (around 09/06/2024).  Corean Fireman, MSN, NP-C [facility]ious [facility]  [email or web address] Pager: [phone] Office: [phone] [facility] Main Line:  [phone] *Secure Chat Communication Welcome   Total time on date of service: 40  minutes  Time breakdown:  20 min face-to-face counseling, reviewing labs, and treatment history 20 min reviewing imaging, updating EMR, patient education   Visit required longitudinal management of chronic hepatitis/liver disease, including review of prior fibrosis assessments, antiviral treatment history, imaging discrepancies, counseling on disease progression, and coordination of follow-up testing and specialty care.   "

## 2024-06-11 LAB — HEPATITIS C RNA QUANTITATIVE
HCV Quantitative Log: <1.18 {Log_IU}/mL
HCV RNA, PCR, QN: <15 [IU]/mL

## 2024-06-12 ENCOUNTER — Ambulatory Visit: Payer: Self-pay | Admitting: Infectious Diseases

## 2024-06-15 ENCOUNTER — Ambulatory Visit (HOSPITAL_COMMUNITY)
Admission: RE | Admit: 2024-06-15 | Discharge: 2024-06-15 | Disposition: A | Discharge status: Home / Self Care | Source: Ambulatory Visit | Attending: Infectious Diseases | Admitting: Infectious Diseases

## 2024-06-15 ENCOUNTER — Other Ambulatory Visit: Payer: Self-pay

## 2024-06-15 ENCOUNTER — Other Ambulatory Visit (HOSPITAL_COMMUNITY): Payer: Self-pay

## 2024-06-15 DIAGNOSIS — B182 Chronic viral hepatitis C: Secondary | ICD-10-CM | POA: Insufficient documentation

## 2024-06-23 ENCOUNTER — Emergency Department (HOSPITAL_COMMUNITY)
Admission: EM | Admit: 2024-06-23 | Discharge: 2024-06-24 | Disposition: A | Attending: Emergency Medicine | Admitting: Emergency Medicine

## 2024-06-23 ENCOUNTER — Emergency Department (HOSPITAL_COMMUNITY)

## 2024-06-23 ENCOUNTER — Encounter (HOSPITAL_COMMUNITY): Payer: Self-pay | Admitting: *Deleted

## 2024-06-23 ENCOUNTER — Other Ambulatory Visit: Payer: Self-pay

## 2024-06-23 DIAGNOSIS — Z8546 Personal history of malignant neoplasm of prostate: Secondary | ICD-10-CM | POA: Diagnosis not present

## 2024-06-23 DIAGNOSIS — R079 Chest pain, unspecified: Secondary | ICD-10-CM | POA: Diagnosis present

## 2024-06-23 DIAGNOSIS — Z7951 Long term (current) use of inhaled steroids: Secondary | ICD-10-CM | POA: Insufficient documentation

## 2024-06-23 DIAGNOSIS — R0602 Shortness of breath: Secondary | ICD-10-CM | POA: Diagnosis not present

## 2024-06-23 DIAGNOSIS — Z79899 Other long term (current) drug therapy: Secondary | ICD-10-CM | POA: Diagnosis not present

## 2024-06-23 DIAGNOSIS — I10 Essential (primary) hypertension: Secondary | ICD-10-CM | POA: Insufficient documentation

## 2024-06-23 DIAGNOSIS — J45909 Unspecified asthma, uncomplicated: Secondary | ICD-10-CM | POA: Insufficient documentation

## 2024-06-23 DIAGNOSIS — R072 Precordial pain: Secondary | ICD-10-CM

## 2024-06-23 LAB — TROPONIN T, HIGH SENSITIVITY: Troponin T High Sensitivity: 7 ng/L (ref 0–19)

## 2024-06-23 LAB — BASIC METABOLIC PANEL WITH GFR
Anion gap: 12 (ref 5–15)
BUN: 28 mg/dL — ABNORMAL HIGH (ref 8–23)
CO2: 23 mmol/L (ref 22–32)
Calcium: 9.9 mg/dL (ref 8.9–10.3)
Chloride: 103 mmol/L (ref 98–111)
Creatinine, Ser: 1.39 mg/dL — ABNORMAL HIGH (ref 0.61–1.24)
GFR, Estimated: 57 mL/min — ABNORMAL LOW
Glucose, Bld: 94 mg/dL (ref 70–99)
Potassium: 4.3 mmol/L (ref 3.5–5.1)
Sodium: 138 mmol/L (ref 135–145)

## 2024-06-23 LAB — CBC
HCT: 35.7 % — ABNORMAL LOW (ref 39.0–52.0)
Hemoglobin: 11.9 g/dL — ABNORMAL LOW (ref 13.0–17.0)
MCH: 32.6 pg (ref 26.0–34.0)
MCHC: 33.3 g/dL (ref 30.0–36.0)
MCV: 97.8 fL (ref 80.0–100.0)
Platelets: 155 10*3/uL (ref 150–400)
RBC: 3.65 MIL/uL — ABNORMAL LOW (ref 4.22–5.81)
RDW: 12 % (ref 11.5–15.5)
WBC: 4.9 10*3/uL (ref 4.0–10.5)
nRBC: 0 % (ref 0.0–0.2)

## 2024-06-23 LAB — PRO BRAIN NATRIURETIC PEPTIDE: Pro Brain Natriuretic Peptide: <50 pg/mL

## 2024-06-23 LAB — D-DIMER, QUANTITATIVE: D-Dimer, Quant: <0.27 ug{FEU}/mL (ref 0.00–0.50)

## 2024-06-23 NOTE — ED Notes (Signed)
 Patient transported to X-ray

## 2024-06-23 NOTE — ED Provider Notes (Signed)
 Patient here with chest pain.  States that he has had pain since 7pm.  States that it comes in sharp stabbing episodes that happen when he takes a deep breath.  He states that he has had this in the past, but never found out what caused it.  No active chest pain on my evaluation.    Physical Exam  BP 131/88   Pulse 95   Temp 98.2 F (36.8 C) (Oral)   Resp 18   Ht 5' 9 (1.753 m)   Wt 105.8 kg   SpO2 94%   BMI 34.44 kg/m   Physical Exam Vitals and nursing note reviewed.  HENT:     Head: Normocephalic.  Cardiovascular:     Rate and Rhythm: Normal rate and regular rhythm.  Pulmonary:     Breath sounds: Normal breath sounds.  Abdominal:     Palpations: Abdomen is soft.     Comments: Non tender  Musculoskeletal:     Cervical back: Normal range of motion.  Neurological:     Mental Status: He is alert.     Procedures  Procedures  ED Course / MDM    Medical Decision Making Patient here with intermittent episodes of chest pain that started around 7 PM.  Denies cough or fever, doubt infectious etiology.  Troponins and EKG are reassuring, doubt ACS.  Heart score is 3, normal EKG, story inconsistent with typical ACS, 1 point for age, 2 points for risk factors.  Amount and/or Complexity of Data Reviewed Labs: ordered.    Details: Initial Trop negative, will need repeat.  D-dimer negative, doubt PE. BNP is normal, doubt CHF.  No evidence of volume overload. Radiology: ordered and independent interpretation performed.    Details: Chest x-ray without obvious effusion or opacity ECG/medicine tests: independent interpretation performed.    Details: Normal sinus rhythm on EKG, no acute ischemic changes          Vicky Charleston, PA-C 06/24/24 0046    Griselda Norris, MD 06/24/24 [phone]

## 2024-06-23 NOTE — ED Provider Notes (Signed)
 " [place] [facility] Provider Note   CSN: [id number] Arrival date & time: 06/23/24  2134     Patient presents with: Chest Pain and Shortness of Breath   Shaun Morgan is a 64 y.o. male.  Past medical history significant for asthma, hypertension, and prostate cancer presents today for chest pain and difficulty breathing x 1 hour.  Patient does report pleuritic chest pain.  Patient denies lower extremity swelling, fever, chills, cough, congestion, nausea, vomiting, abdominal pain, any other complaints at this time.  Patient has not on a blood thinner.  {Add pertinent medical, surgical, social history, OB history to HPI:32947}  Chest Pain Associated symptoms: shortness of breath   Shortness of Breath Associated symptoms: chest pain        Prior to Admission medications  Medication Sig Start Date End Date Taking? Authorizing Provider  albuterol  (VENTOLIN  HFA) 108 (90 Base) MCG/ACT inhaler Inhale 2 puffs into the lungs every 6 (six) hours as needed for wheezing or shortness of breath. 08/25/23   Almarie Waddell NOVAK, NP  alendronate  (FOSAMAX ) 70 MG tablet Take 1 tablet (70 mg total) by mouth once a week. Take with a full glass of water on an empty stomach. 11/14/23   Stoneking, Adine PARAS., MD  atorvastatin  (LIPITOR) 20 MG tablet Take 1 tablet (20 mg total) by mouth daily. Patient not taking: Reported on 06/08/2024 01/29/24 05/01/24  Beola Terrall RAMAN, PA-C  bisacodyl  (DULCOLAX) 10 MG suppository Place 1 suppository (10 mg total) rectally as needed for moderate constipation. 06/29/23   Theadore Ozell HERO, MD  Calcium  Carb-Cholecalciferol  (CALCIUM  + VITAMIN D3) 600-10 MG-MCG TABS Take 1 tablet by mouth 2 (two) times daily with a meal. Patient not taking: Reported on 06/08/2024 12/27/22   Bruning, Ashlyn, PA-C  docusate sodium  (COLACE) 100 MG capsule Take 1 capsule (100 mg total) by mouth every 12 (twelve) hours. 05/23/24   Haze Lonni PARAS, MD   fluticasone -salmeterol (WIXELA INHUB) 100-50 MCG/ACT AEPB Inhale 1 puff into the lungs 2 (two) times daily. 04/06/23   Almarie Waddell NOVAK, NP  gabapentin  (NEURONTIN ) 300 MG capsule Take 1 capsule (300 mg total) by mouth 3 (three) times daily. Patient not taking: Reported on 06/08/2024 06/06/23   Almarie Waddell B, NP  hydrocortisone  cream 1 % Apply 1 Application topically 2 (two) times daily. 12/27/22   Bruning, Ashlyn, PA-C  ketoconazole  (NIZORAL ) 2 % cream Apply 1 Application topically daily. 06/03/23   Gershon Donnice SAUNDERS, DPM  lidocaine  (XYLOCAINE ) 5 % ointment Apply 1 Application topically as needed. 01/07/23   Randol Simmonds, MD  lisinopril -hydrochlorothiazide  (ZESTORETIC ) 20-25 MG tablet Take 1 tablet by mouth daily. 05/28/24     methocarbamol  (ROBAXIN ) 500 MG tablet Take 1 tablet (500 mg total) by mouth every 8 (eight) hours as needed for muscle spasms. 05/23/24   Haze Lonni PARAS, MD  nicotine  (NICODERM CQ  - DOSED IN MG/24 HOURS) 14 mg/24hr patch Place 1 patch (14 mg total) onto the skin daily. 11/29/23   Ladona Heinz, MD  polyethylene glycol powder (GLYCOLAX /MIRALAX ) 17 GM/SCOOP powder Take 17 g by mouth 2 (two) times daily as needed for moderate constipation or severe constipation. Dissolve 1 capful (17g) in 4-8 ounces of liquid and take by mouth daily. 05/23/24   Haze Lonni PARAS, MD  Sofosbuv-Velpatasv-Voxilaprev (VOSEVI ) 400-100-100 MG TABS Take 1 tablet by mouth daily. Patient not taking: Reported on 06/08/2024 02/07/24   Melvenia Corean SAILOR, NP  solifenacin  (VESICARE ) 5 MG tablet Take 1 tablet (5 mg  total) by mouth at bedtime. 11/14/23   Stoneking, Adine PARAS., MD  tamsulosin  (FLOMAX ) 0.4 MG CAPS capsule Take 2 capsules (0.8 mg total) by mouth daily after supper. 03/08/24   Stoneking, Adine PARAS., MD    Allergies: Acetaminophen     Review of Systems  Respiratory:  Positive for shortness of breath.   Cardiovascular:  Positive for chest pain.    Updated Vital Signs BP 131/88   Pulse 95   Temp 98.2  F (36.8 C) (Oral)   Resp 18   Ht 5' 9 (1.753 m)   Wt 105.8 kg   SpO2 94%   BMI 34.44 kg/m   Physical Exam Vitals and nursing note reviewed.  Constitutional:      General: He is not in acute distress.    Appearance: He is well-developed. He is not toxic-appearing.  HENT:     Head: Normocephalic and atraumatic.  Eyes:     Conjunctiva/sclera: Conjunctivae normal.  Cardiovascular:     Rate and Rhythm: Normal rate and regular rhythm.     Heart sounds: Normal heart sounds. No murmur heard. Pulmonary:     Effort: Pulmonary effort is normal. No tachypnea or respiratory distress.     Breath sounds: Normal breath sounds.  Abdominal:     Palpations: Abdomen is soft.     Tenderness: There is no abdominal tenderness.  Musculoskeletal:        General: No swelling.     Cervical back: Neck supple.     Right lower leg: No tenderness. No edema.     Left lower leg: No tenderness. No edema.  Skin:    General: Skin is warm and dry.     Capillary Refill: Capillary refill takes less than 2 seconds.  Neurological:     General: No focal deficit present.     Mental Status: He is alert.  Psychiatric:        Mood and Affect: Mood normal.     (all labs ordered are listed, but only abnormal results are displayed) Labs Reviewed  BASIC METABOLIC PANEL WITH GFR  CBC  PRO BRAIN NATRIURETIC PEPTIDE  TROPONIN T, HIGH SENSITIVITY    EKG: None  Radiology: No results found.  {Document cardiac monitor, telemetry assessment procedure when appropriate:32947} Procedures   Medications Ordered in the ED - No data to display    {Click here for ABCD2, HEART and other calculators REFRESH Note before signing:1}                              Medical Decision Making  This patient presents to the ED for concern of chest pain and shortness of breath, this involves an extensive number of treatment options, and is a complaint that carries with it a high risk of complications and morbidity.  The  differential diagnosis includes STEMI, NSTEMI, arrhythmia, anemia, electrolyte abnormality, PE, anxiety, GERD, asthma exacerbation, pneumonia   Co morbidities / Chronic conditions that complicate the patient evaluation  Prostate cancer, hypertension, asthma   Additional history obtained:  Additional history obtained from EMR External records from outside source obtained and reviewed including ***   Lab Tests:  I Ordered, and personally interpreted labs.  The pertinent results include:  ***   Imaging Studies ordered:  I ordered imaging studies including chest x-ray I independently visualized and interpreted imaging which showed *** I agree with the radiologist interpretation   Cardiac Monitoring: / EKG:  The patient was maintained on  a cardiac monitor.  I personally viewed and interpreted the cardiac monitored which showed an underlying rhythm of: Normal sinus rhythm   Problem List / ED Course / Critical interventions / Medication management  I ordered medication including ***   Reevaluation of the patient after these medicines showed that the patient *** I have reviewed the patients home medicines and have made adjustments as needed   Consultations Obtained:  I requested consultation with the ***,  and discussed lab and imaging findings as well as pertinent plan - they recommend: ***   Test / Admission - Considered:  ***   {Document critical care time when appropriate  Document review of labs and clinical decision tools ie CHADS2VASC2, etc  Document your independent review of radiology images and any outside records  Document your discussion with family members, caretakers and with consultants  Document social determinants of health affecting pt's care  Document your decision making why or why not admission, treatments were needed:32947:::1}   Final diagnoses:  None    ED Discharge Orders     None        "

## 2024-06-23 NOTE — ED Notes (Signed)
 Patient returned from X-ray

## 2024-06-23 NOTE — ED Triage Notes (Signed)
 The pt is c/o chest pain and difficulty breathing  for one hour

## 2024-06-24 LAB — TROPONIN T, HIGH SENSITIVITY: Troponin T High Sensitivity: 7 ng/L (ref 0–19)

## 2024-07-04 ENCOUNTER — Other Ambulatory Visit (HOSPITAL_COMMUNITY): Payer: Self-pay

## 2024-07-13 ENCOUNTER — Ambulatory Visit: Admitting: Orthopedic Surgery

## 2024-09-06 ENCOUNTER — Ambulatory Visit: Admitting: Urology

## 2024-09-24 ENCOUNTER — Ambulatory Visit: Payer: Self-pay | Admitting: Infectious Diseases
